# Patient Record
Sex: Male | Born: 1935 | Race: White | Hispanic: No | Marital: Married | Smoking: Never smoker
Health system: Southern US, Community
[De-identification: ages and names within clinical notes are randomized; demographics above are authoritative.]

## PROBLEM LIST (undated history)

## (undated) DIAGNOSIS — H3530 Unspecified macular degeneration: Secondary | ICD-10-CM

## (undated) DIAGNOSIS — I10 Essential (primary) hypertension: Secondary | ICD-10-CM

## (undated) HISTORY — DX: Unspecified macular degeneration: H35.30

## (undated) HISTORY — PX: EYE SURGERY: SHX253

## (undated) HISTORY — PX: CATARACT EXTRACTION: SUR2

## (undated) HISTORY — PX: ROTATOR CUFF REPAIR: SHX139

---

## 1998-09-07 ENCOUNTER — Ambulatory Visit (HOSPITAL_COMMUNITY): Admission: RE | Admit: 1998-09-07 | Discharge: 1998-09-07 | Payer: Self-pay | Admitting: Family Medicine

## 1998-09-07 ENCOUNTER — Encounter: Payer: Self-pay | Admitting: Family Medicine

## 1999-12-24 ENCOUNTER — Ambulatory Visit (HOSPITAL_COMMUNITY): Admission: RE | Admit: 1999-12-24 | Discharge: 1999-12-24 | Payer: Self-pay | Admitting: Gastroenterology

## 1999-12-24 ENCOUNTER — Encounter (INDEPENDENT_AMBULATORY_CARE_PROVIDER_SITE_OTHER): Payer: Self-pay | Admitting: *Deleted

## 2000-12-22 ENCOUNTER — Encounter (INDEPENDENT_AMBULATORY_CARE_PROVIDER_SITE_OTHER): Payer: Self-pay | Admitting: Specialist

## 2000-12-22 ENCOUNTER — Ambulatory Visit (HOSPITAL_COMMUNITY): Admission: RE | Admit: 2000-12-22 | Discharge: 2000-12-22 | Payer: Self-pay | Admitting: Gastroenterology

## 2002-10-09 ENCOUNTER — Ambulatory Visit (HOSPITAL_COMMUNITY): Admission: RE | Admit: 2002-10-09 | Discharge: 2002-10-09 | Payer: Self-pay | Admitting: Family Medicine

## 2002-10-09 ENCOUNTER — Encounter: Payer: Self-pay | Admitting: Family Medicine

## 2009-07-16 ENCOUNTER — Encounter: Admission: RE | Admit: 2009-07-16 | Discharge: 2009-07-16 | Payer: Self-pay

## 2010-01-02 ENCOUNTER — Observation Stay (HOSPITAL_COMMUNITY)
Admission: AD | Admit: 2010-01-02 | Discharge: 2010-01-03 | Payer: Self-pay | Source: Home / Self Care | Admitting: Orthopedic Surgery

## 2010-07-26 LAB — URINALYSIS, ROUTINE W REFLEX MICROSCOPIC
Bilirubin Urine: NEGATIVE
Glucose, UA: NEGATIVE mg/dL
Hgb urine dipstick: NEGATIVE
Ketones, ur: NEGATIVE mg/dL
Nitrite: NEGATIVE
Protein, ur: NEGATIVE mg/dL
Specific Gravity, Urine: 1.021 (ref 1.005–1.030)
Urobilinogen, UA: 0.2 mg/dL (ref 0.0–1.0)
pH: 6 (ref 5.0–8.0)

## 2010-07-26 LAB — URINE MICROSCOPIC-ADD ON

## 2010-07-26 LAB — SURGICAL PCR SCREEN
MRSA, PCR: NEGATIVE
Staphylococcus aureus: NEGATIVE

## 2010-07-26 LAB — COMPREHENSIVE METABOLIC PANEL
AST: 15 U/L (ref 0–37)
CO2: 29 mEq/L (ref 19–32)
Chloride: 104 mEq/L (ref 96–112)
Creatinine, Ser: 1.04 mg/dL (ref 0.4–1.5)
GFR calc Af Amer: >60 mL/min (ref >60)
GFR calc non Af Amer: >60 mL/min (ref >60)
Total Bilirubin: 0.7 mg/dL (ref 0.3–1.2)

## 2010-07-26 LAB — CBC
Hemoglobin: 13.2 g/dL (ref 13.0–17.0)
MCH: 31.4 pg (ref 26.0–34.0)
MCV: 91 fL (ref 78.0–100.0)
RBC: 4.21 MIL/uL — ABNORMAL LOW (ref 4.22–5.81)

## 2010-07-26 LAB — DIFFERENTIAL
Basophils Absolute: 0.1 10*3/uL (ref 0.0–0.1)
Basophils Relative: 1 % (ref 0–1)
Eosinophils Absolute: 0.1 10*3/uL (ref 0.0–0.7)
Eosinophils Relative: 1 % (ref 0–5)
Lymphocytes Relative: 15 % (ref 12–46)

## 2010-07-26 LAB — PROTIME-INR: Prothrombin Time: 13.9 seconds (ref 11.6–15.2)

## 2010-09-27 NOTE — Procedures (Signed)
[facility]  Patient:    [name], [name]                         MRN: [id number] Proc. Date: 12/24/99 Adm. Date:  [id number] Attending:  Nelda Marseille CC:         Petra Kuba, M.D.  [facility]   Procedure Report  PROCEDURE PERFORMED:  Colonoscopy with polypectomy.  ENDOSCOPIST:  Petra Kuba, M.D.  INDICATIONS FOR PROCEDURE:  Bright red blood per rectum in a patient due for colonic screening and guaiac positive.  Consent was signed after risks, benefits, methods, and options were thoroughly discussed in the office and with him and his wife prior to any premedicines given.  MEDICATIONS USED:  Demerol 50 mg, Versed 5 mg.  DESCRIPTION OF PROCEDURE:  Rectal inspection was pertinent for external hemorrhoids.  Digital exam was negative.  Video colonoscope was inserted and fairly easily advanced around the colon to the cecum.  This required rolling him on his back and some abdominal pressure.  The cecum was identified by the appendiceal orifice and the ileocecal valve.  In fact the scope was inserted a short ways in the terminal ileum which was normal.  Photodocumentation was obtained.  No obvious abnormality was seen on insertion.  The scope was slowly withdrawn.  On slow withdrawal the cecum and the ascending were normal. The prep was adequate.  There was some liquid stool that required washing and suctioning.  In the midtransverse and middescending, two questionable tiny to small polyps were seen and were each cold biopsied x 1 or 2 and put in the same container.  The scope was slowly withdrawn, no additional findings were seen as we withdrew back to the rectum and in the most distal rectum, a 1 to 1.5 sessile polyp was seen.  It was confirmed on retroflexion.  Also internal hemorrhoids were seen on retroflexion.  We went ahead and marked the snare in the customary fashion and went ahead and snared the polyp in the conventional fashion.   Electrocautery was applied.  The polyp was removed.  Suctioned onto the head of the scope and the polyps was recovered by removing the scope.  The scope was reinserted.  There seemed to be a small amount of residual polyp on the edge which was snared, electrocautery applied and this piece was suctioned through the scope and collected in the trap.  There still seemed to be possibly some residual polyp on the edge of the snare margin and multiple cold biopsies around the edge were obtained.  Just next to the polypectomy site, possibly some superficial spreading adenomatous tissue was seen and two hot biopsies were obtained of this area and put in a third container to keep separate.  The scope was retroflexed which again seemed like the entire polyp was removed with no obvious residual polyp.  There were no signs of bleeding. The scope was straightened.  Air was withdrawn and the scope removed.  The patient tolerated the procedure well.  There was no obvious immediate complication.  ENDOSCOPIC DIAGNOSIS: 1. Internal and external hemorrhoids. 2. 1 to 1.5 sessile distal rectal polyp status post snare x 2 with multiple    hot biopsies of the edge. 3. Hot biopsy of an area next to the polyp, questionably this was a residual    superficial spreading part of the polyp put in the third container. 4. Two tiny to small descending and  transverse questionable polyps, status    post cold biopsy, put in a first container. 5. Otherwise within normal limits to the terminal ileum.  PLAN:  Await pathology but probably will need at least a flexible sigmoidoscopy to document complete healing in probably probably six to 12 months.  Otherwise will see him back in six weeks to recheck symptoms, guaiacs and make sure no further work-up plans are needed.  Also put him on two-week customary postpolypectomy instructions.  Call me sooner p.r.n. DD:  12/24/99 TD:  12/24/99 Job: [id number]

## 2010-09-27 NOTE — Procedures (Signed)
[facility]  Patient:    [name], [name]                         MRN: [id number] Proc. Date: 12/22/00 Adm. Date:  [id number] Attending:  Nelda Marseille CC:         Doreatha Lew, M.D.   Procedure Report  PROCEDURE:  Colonoscopy with polypectomy.  INDICATIONS FOR PROCEDURE:  A patient with a history of colon polyps due for repeat screening.  Consent was signed after risks, benefits, methods, and options were thoroughly discussed multiple times in the past.  MEDICINES USED:  Demerol 60, Versed 6.  DESCRIPTION OF PROCEDURE:  Rectal inspection was pertinent for small external hemorrhoids. Digital exam was negative. The video pediatric colonoscope was inserted and fairly easily advanced around the colon to the cecum. This did require some abdominal pressure but no position changes. No obvious abnormality was seen on insertion. The cecum was identified by the appendiceal orifice and the ileocecal valve. The scope was then slowly withdrawn. The prep was adequate. There was some liquid stool that required washing and suctioning. In the proximal ascending colon, a 2-3 mm polyp along the fold just above the cecum was seen and hot biopsied x 1. The scope was slowly withdrawn. No other polypoid lesions, massed, diverticula or other abnormalities were seen as we slowly withdrew back to the rectum. The sigmoid was tortuous with some spasms but no obvious lesions were seen. A good look at the rectum did not reveal any residual polyp from his previous polypectomy. Once back in the rectum, the scope was retroflexed pertinent for some internal hemorrhoids. The scope was straightened and readvanced a short ways up the sigmoid, air was suctioned, scope removed. The patient tolerated the procedure well and there was no obvious or immediate complications.  ENDOSCOPIC DIAGNOSIS: 1. Internal/external hemorrhoids. 2. Tortuous sigmoid. 3. Small proximal ascending  polyp status post hot biopsy. 4. Otherwise within normal limits to the cecum.  PLAN:  Await pathology but probably repeat screening in five years. GI follow-up p.r.n. otherwise return care to Dr. Adonis Housekeeper for the customary health care maintenance to include yearly rectals and guaiacs and will hold aspirins and nonsteroidals for one week. DD:  12/22/00 TD:  12/22/00 Job: [id number]

## 2011-03-28 ENCOUNTER — Other Ambulatory Visit: Payer: Self-pay | Admitting: Gastroenterology

## 2012-03-23 ENCOUNTER — Other Ambulatory Visit: Payer: Self-pay | Admitting: Dermatology

## 2013-08-08 ENCOUNTER — Emergency Department (HOSPITAL_COMMUNITY)
Admission: EM | Admit: 2013-08-08 | Discharge: 2013-08-08 | Disposition: A | Discharge status: Home / Self Care | Payer: Medicare Other | Attending: Emergency Medicine | Admitting: Emergency Medicine

## 2013-08-08 ENCOUNTER — Emergency Department (HOSPITAL_COMMUNITY): Payer: Medicare Other

## 2013-08-08 ENCOUNTER — Encounter (HOSPITAL_COMMUNITY): Payer: Self-pay | Admitting: Emergency Medicine

## 2013-08-08 DIAGNOSIS — S129XXA Fracture of neck, unspecified, initial encounter: Secondary | ICD-10-CM | POA: Insufficient documentation

## 2013-08-08 DIAGNOSIS — Y9389 Activity, other specified: Secondary | ICD-10-CM | POA: Insufficient documentation

## 2013-08-08 DIAGNOSIS — Y929 Unspecified place or not applicable: Secondary | ICD-10-CM | POA: Insufficient documentation

## 2013-08-08 DIAGNOSIS — IMO0002 Reserved for concepts with insufficient information to code with codable children: Secondary | ICD-10-CM | POA: Insufficient documentation

## 2013-08-08 DIAGNOSIS — Z23 Encounter for immunization: Secondary | ICD-10-CM | POA: Insufficient documentation

## 2013-08-08 DIAGNOSIS — S0191XA Laceration without foreign body of unspecified part of head, initial encounter: Secondary | ICD-10-CM

## 2013-08-08 DIAGNOSIS — S0990XA Unspecified injury of head, initial encounter: Secondary | ICD-10-CM | POA: Insufficient documentation

## 2013-08-08 DIAGNOSIS — W208XXA Other cause of strike by thrown, projected or falling object, initial encounter: Secondary | ICD-10-CM | POA: Insufficient documentation

## 2013-08-08 DIAGNOSIS — I10 Essential (primary) hypertension: Secondary | ICD-10-CM | POA: Insufficient documentation

## 2013-08-08 DIAGNOSIS — S0100XA Unspecified open wound of scalp, initial encounter: Secondary | ICD-10-CM | POA: Insufficient documentation

## 2013-08-08 DIAGNOSIS — S12100A Unspecified displaced fracture of second cervical vertebra, initial encounter for closed fracture: Secondary | ICD-10-CM

## 2013-08-08 HISTORY — DX: Essential (primary) hypertension: I10

## 2013-08-08 MED ORDER — TETANUS-DIPHTH-ACELL PERTUSSIS 5-2.5-18.5 LF-MCG/0.5 IM SUSP
0.5000 mL | Freq: Once | INTRAMUSCULAR | Status: AC
  Administered 2013-08-08: 0.5 mL via INTRAMUSCULAR
  Filled 2013-08-08: qty 0.5

## 2013-08-08 MED ORDER — OXYCODONE HCL 5 MG PO TABS: 5.0000 mg | ORAL_TABLET | ORAL | Status: DC | PRN

## 2013-08-08 NOTE — ED Notes (Signed)
Ortho paged. 

## 2013-08-08 NOTE — ED Notes (Signed)
Discussed with patient that discharge papers will be produced soon so he can leave.

## 2013-08-08 NOTE — ED Notes (Signed)
Spoke with Misty StanleyLisa, PA-C, and no records of tetanus shot recently, so she would still like for this to be administered.

## 2013-08-08 NOTE — ED Notes (Signed)
Family reports that the patient has already received tetanus shot 2 years ago.

## 2013-08-08 NOTE — ED Notes (Signed)
Tree branch fell on top of patient. Denies LOC. Unsure of last tetanus shot.  Large abrasion to top of head, small area of active bleeding. PA at bedside evaluating patient. Pressure dressing applied. C collar in place. Pt states neck feels stiff.

## 2013-08-08 NOTE — ED Notes (Signed)
Patient transported to X-ray 

## 2013-08-08 NOTE — ED Notes (Signed)
Patient back from CT and X-ray   

## 2013-08-08 NOTE — ED Provider Notes (Signed)
CSN: [id number]     Arrival date & time 08/08/13  1839 History   First MD Initiated Contact with Patient 08/08/13 1851     Chief Complaint  Patient presents with  . Head Laceration     (Consider location/radiation/quality/duration/timing/severity/associated sxs/prior Treatment) The history is provided by the patient, a relative and medical records.   This is a 80 year old male with past medical history significant for hypertension, presenting to the ED for head injury.  Patient was cutting tree limbs with a chainsaw when a medium-sized limb fell on top of his head. Patient denies loss of consciousness.  Pt did sustain a large laceration to the top of his head, continues to bleed on arrival, and is also complaining of neck pain.  Pt states he has a headache localized and area of impact.  Denies dizziness, confusion, visual disturbance, or tinnitus.  Pt not currently on any anti-coagulants.  Date of last tetanus unknown, thinks was 1-2 years ago but not sure.  VS stable on arrival.  Past Medical History  Diagnosis Date  . Hypertension    History reviewed. No pertinent past surgical history. History reviewed. No pertinent family history. History  Substance Use Topics  . Smoking status: Never Smoker   . Smokeless tobacco: Not on file  . Alcohol Use: No    Review of Systems  Musculoskeletal: Positive for neck pain.  Skin: Positive for wound.  All other systems reviewed and are negative.   Allergies  Review of patient's allergies indicates no known allergies.  Home Medications  No current outpatient prescriptions on file. BP 159/87  Pulse 91  Temp(Src) 98.3 F (36.8 C) (Oral)  Resp 18  Ht 5\' 3"  (1.6 m)  Wt 129 lb (58.514 kg)  BMI 22.86 kg/m2  SpO2 100%  Physical Exam  Nursing note and vitals reviewed. Constitutional: He is oriented to person, place, and time. He appears well-developed and well-nourished. No distress.  HENT:  Head: Normocephalic. Head is with laceration.      Mouth/Throat: Oropharynx is clear and moist.  approx 8cm laceration to top of scalp, mild bleeding noted to proximal aspect; no FB noted  Eyes: Conjunctivae and EOM are normal. Pupils are equal, round, and reactive to light.  Neck:  Immobilized in c-collar  Cardiovascular: Normal rate, regular rhythm and normal heart sounds.   Pulmonary/Chest: Effort normal and breath sounds normal. No respiratory distress. He has no wheezes.  Musculoskeletal:       Hands: Small abrasion to left proximal thumb without bleeding; no deformity or bony tenderness; full ROM maintained; strong radial pulse and cap refill; sensation intact  Neurological: He is alert and oriented to person, place, and time.  AAOx3, answering questions appropriately; equal strength UE and LE bilaterally; CN grossly intact; moves all extremities appropriately without ataxia; no focal neuro deficits or facial asymmetry appreciated  Skin: Skin is warm and dry. He is not diaphoretic.  Psychiatric: He has a normal mood and affect.    ED Course  Procedures (including critical care time)  LACERATION REPAIR Performed by: Garlon Hatchet Authorized by: Garlon Hatchet Consent: Verbal consent obtained. Risks and benefits: risks, benefits and alternatives were discussed Consent given by: patient Patient identity confirmed: provided demographic data Prepped and Draped in normal sterile fashion Wound explored  Laceration Location: scalp  Laceration Length: 8 cm  No Foreign Bodies seen or palpated  Anesthesia: local infiltration  Local anesthetic: lidocaine 1% with epinephrine  Anesthetic total: 6 ml  Irrigation method: syringe Amount  of cleaning: standard  Skin closure: staples  Number of staples:  7  Technique: n/a  Patient tolerance: Patient tolerated the procedure well with no immediate complications.  Labs Review Labs Reviewed - No data to display Imaging Review Ct Head Wo Contrast  08/08/2013   CLINICAL  DATA:  Fall with trauma to the head and neck.  EXAM: CT HEAD WITHOUT CONTRAST  CT CERVICAL SPINE WITHOUT CONTRAST  TECHNIQUE: Multidetector CT imaging of the head and cervical spine was performed following the standard protocol without intravenous contrast. Multiplanar CT image reconstructions of the cervical spine were also generated.  COMPARISON:  None.  FINDINGS: CT HEAD FINDINGS  The brain shows mild generalized atrophy. No sign of acute infarction, mass lesion, hemorrhage, hydrocephalus or extra-axial collection. There is a a right frontal scalp injury. No underlying skull fracture. No fluid in the sinuses, middle ears or mastoids.  CT CERVICAL SPINE FINDINGS  There is acute fracture through the base of the dens. There were some pre-existing cysts. No displacement. No compromise of the spinal canal. No other cervical spine region fracture. There is degenerative retrolisthesis at C3-4 of 2 mm. There is ordinary spondylosis and facet arthropathy. There is mild loss of height anteriorly at T2 which is age indeterminate but presumed chronic.  IMPRESSION: Head CT:  No acute intracranial finding.  Age related atrophy.  Cervical spine CT: Acute fracture of the dens, nondisplaced. No neural encroachment. No other acute cervical injury.  Mild loss of height anteriorly at T2, age indeterminate but presumed chronic.  These results were called by telephone at the time of interpretation on 08/08/2013 at 8:13 PM to [facility] , who verbally acknowledged these results.   Electronically Signed   By: Paulina Fusi M.D.   On: 08/08/2013 20:13   Ct Cervical Spine Wo Contrast  08/08/2013   CLINICAL DATA:  Fall with trauma to the head and neck.  EXAM: CT HEAD WITHOUT CONTRAST  CT CERVICAL SPINE WITHOUT CONTRAST  TECHNIQUE: Multidetector CT imaging of the head and cervical spine was performed following the standard protocol without intravenous contrast. Multiplanar CT image reconstructions of the cervical spine were also  generated.  COMPARISON:  None.  FINDINGS: CT HEAD FINDINGS  The brain shows mild generalized atrophy. No sign of acute infarction, mass lesion, hemorrhage, hydrocephalus or extra-axial collection. There is a a right frontal scalp injury. No underlying skull fracture. No fluid in the sinuses, middle ears or mastoids.  CT CERVICAL SPINE FINDINGS  There is acute fracture through the base of the dens. There were some pre-existing cysts. No displacement. No compromise of the spinal canal. No other cervical spine region fracture. There is degenerative retrolisthesis at C3-4 of 2 mm. There is ordinary spondylosis and facet arthropathy. There is mild loss of height anteriorly at T2 which is age indeterminate but presumed chronic.  IMPRESSION: Head CT:  No acute intracranial finding.  Age related atrophy.  Cervical spine CT: Acute fracture of the dens, nondisplaced. No neural encroachment. No other acute cervical injury.  Mild loss of height anteriorly at T2, age indeterminate but presumed chronic.  These results were called by telephone at the time of interpretation on 08/08/2013 at 8:13 PM to [facility] , who verbally acknowledged these results.   Electronically Signed   By: Paulina Fusi M.D.   On: 08/08/2013 20:13   Dg Finger Thumb Left  08/08/2013   CLINICAL DATA:  Laceration at the MCP joint.  EXAM: LEFT THUMB 2+V  COMPARISON:  None.  FINDINGS: No evidence of fracture or dislocation. There are mild degenerative changes at the MCP joint. Small periarticular calcification dorsal to the interphalangeal joint not significant. No radiopaque foreign object.  IMPRESSION: No fracture or foreign object.   Electronically Signed   By: Paulina FusiMark  Shogry M.D.   On: 08/08/2013 20:03     EKG Interpretation None      MDM   Final diagnoses:  Closed C2 fracture  Laceration of head   80 year old male s/p head injury after tree limb fell on top of head.  No LOC.  Not currently on anti-coagulants.  Neurovascularly intact.  Will  obtain CT head, CS, and left thumb film.  Tetanus updated.  CT revealing C2 fx at base of dens.  No ICH identified.  Thumb film negative.  Discussed with neurosurgery, Dr. Wynetta Emeryram who has reviewed films-- recommend aspen cervical collar, FU in office tomorrow for re-check.  Laceration repaired with staples as above, pt tolerated well.  Pt remains neurovascularly intact.  Discussed plan with pt, they agreed.  Return precautions advised.  Discussed with Dr. Gwendolyn GrantWalden who personally evaluated pt and agrees with plan of care.  Garlon HatchetLisa M Dontay Harm, PA-C 08/08/13 2339

## 2013-08-08 NOTE — ED Notes (Addendum)
Pt states that a tree fell on his head approx. ago. Pt has a laceration to the top of head. Pt denies blood thinners. Pt denies LOC. Pt complaining of neck pain (c-collar applied in triage) pt alert and oriented (hard of hearing) pt able to follow commands. Pt has bandage applied to top of head.

## 2013-08-08 NOTE — Discharge Instructions (Signed)
Take the prescribed medication as directed for pain.  This medicine can cause constipation so may wish to add stool softener to daily regimen to prevent this.  Colace, dulcolax, miralax, etc. Are all available over the counter and work well. Leave collar in place at all times until instructed otherwise. Follow-up with Dr. Wynetta Emeryram tomorrow for re-check.  Call his office in the morning for appt time. Return to the ED for new or worsening symptoms.

## 2013-08-08 NOTE — ED Notes (Signed)
Reported blood pressure of 174/112 to Ski [place]isa, [place].  She will visit patient and update on current plan.

## 2013-08-08 NOTE — ED Notes (Signed)
Lisa placed staples, and nursing applied aspen cervicle collar and dressing to head.  Preparing for discharge.

## 2013-08-09 DIAGNOSIS — S12100A Unspecified displaced fracture of second cervical vertebra, initial encounter for closed fracture: Secondary | ICD-10-CM | POA: Insufficient documentation

## 2013-08-09 NOTE — ED Provider Notes (Signed)
Medical screening examination/treatment/procedure(s) were conducted as a shared visit with non-physician practitioner(s) and myself.  I personally evaluated the patient during the encounter.   EKG Interpretation None       Patient here s/p injury while cutting down tree branch. Hit him on the top of the head, no LOC. Walked 0.25 miles after injury [place]. Patient here with extremities intact, no numbness, no weakness. Large head lac. CT shows C2 dens fracture. Neurosurgery reviewed films - placed in [place] collar, will f/u with Dr. Wynetta Emeryram tomorrow. Patient ambulatory in the ED. Lac repaired.  Dagmar HaitWilliam Dajha Urquilla, MD 08/09/13 0030

## 2013-08-15 ENCOUNTER — Emergency Department (HOSPITAL_COMMUNITY)
Admission: EM | Admit: 2013-08-15 | Discharge: 2013-08-15 | Disposition: A | Discharge status: Home / Self Care | Payer: Medicare Other | Attending: Emergency Medicine | Admitting: Emergency Medicine

## 2013-08-15 ENCOUNTER — Emergency Department (HOSPITAL_COMMUNITY): Payer: Medicare Other

## 2013-08-15 ENCOUNTER — Encounter (HOSPITAL_COMMUNITY): Payer: Self-pay | Admitting: Emergency Medicine

## 2013-08-15 DIAGNOSIS — S12110A Anterior displaced Type II dens fracture, initial encounter for closed fracture: Secondary | ICD-10-CM

## 2013-08-15 DIAGNOSIS — G8911 Acute pain due to trauma: Secondary | ICD-10-CM | POA: Insufficient documentation

## 2013-08-15 DIAGNOSIS — S0990XA Unspecified injury of head, initial encounter: Secondary | ICD-10-CM

## 2013-08-15 DIAGNOSIS — I10 Essential (primary) hypertension: Secondary | ICD-10-CM | POA: Insufficient documentation

## 2013-08-15 DIAGNOSIS — Z8781 Personal history of (healed) traumatic fracture: Secondary | ICD-10-CM | POA: Insufficient documentation

## 2013-08-15 DIAGNOSIS — S22029A Unspecified fracture of second thoracic vertebra, initial encounter for closed fracture: Secondary | ICD-10-CM

## 2013-08-15 DIAGNOSIS — Z79899 Other long term (current) drug therapy: Secondary | ICD-10-CM | POA: Insufficient documentation

## 2013-08-15 DIAGNOSIS — R51 Headache: Secondary | ICD-10-CM | POA: Insufficient documentation

## 2013-08-15 DIAGNOSIS — M542 Cervicalgia: Secondary | ICD-10-CM | POA: Insufficient documentation

## 2013-08-15 DIAGNOSIS — Z7982 Long term (current) use of aspirin: Secondary | ICD-10-CM | POA: Insufficient documentation

## 2013-08-15 LAB — BASIC METABOLIC PANEL
BUN: 20 mg/dL (ref 6–23)
CHLORIDE: 101 meq/L (ref 96–112)
CO2: 27 meq/L (ref 19–32)
Calcium: 9.1 mg/dL (ref 8.4–10.5)
Creatinine, Ser: 0.84 mg/dL (ref 0.50–1.35)
GFR calc non Af Amer: 82 mL/min — ABNORMAL LOW (ref >90)
Glucose, Bld: 105 mg/dL — ABNORMAL HIGH (ref 70–99)
POTASSIUM: 4.1 meq/L (ref 3.7–5.3)
Sodium: 143 mEq/L (ref 137–147)

## 2013-08-15 LAB — CBC
HEMATOCRIT: 38.2 % — AB (ref 39.0–52.0)
HEMOGLOBIN: 13.1 g/dL (ref 13.0–17.0)
MCH: 30.4 pg (ref 26.0–34.0)
MCHC: 34.3 g/dL (ref 30.0–36.0)
MCV: 88.6 fL (ref 78.0–100.0)
Platelets: 348 10*3/uL (ref 150–400)
RBC: 4.31 MIL/uL (ref 4.22–5.81)
RDW: 13.6 % (ref 11.5–15.5)
WBC: 11.7 10*3/uL — AB (ref 4.0–10.5)

## 2013-08-15 MED ORDER — NAPROXEN SODIUM 220 MG PO TABS: 220.0000 mg | ORAL_TABLET | Freq: Two times a day (BID) | ORAL | Status: DC

## 2013-08-15 NOTE — ED Notes (Signed)
Pt was seen here a week ago today for a fall and had staples put in. Has followed up with Dr. Glee ArvinKram but he is out of the office until next Tuesday and was sent over here to be seen for increase in head pain. States when he lies down has more pain in his head, has to hold his head really carefully to lie down. Has been wearing his aspen collar as directed. Thinks he has a fx vertebrae but wasn't sure if the MD wanted to do more scans or not. States he is just hurting.

## 2013-08-15 NOTE — ED Notes (Signed)
Discharge instructions reviewed with pt and pt's wife.

## 2013-08-15 NOTE — ED Notes (Signed)
PA at bedside.

## 2013-08-15 NOTE — ED Notes (Signed)
Pt was seen last week for an injury to the head caused by a falling tree limb. Staples were placed. Pt reports 6/10 head pain in the area of the staples, with movement. Pt denies N/V or dizziness. Patient A&O and in NAD.

## 2013-08-15 NOTE — ED Provider Notes (Signed)
CSN: [id number]     Arrival date & time 08/15/13  1432 History   First MD Initiated Contact with Patient 08/15/13 1931     Chief Complaint  Patient presents with  . Headache     (Consider location/radiation/quality/duration/timing/severity/associated sxs/prior Treatment) HPI [name] is a 80 y.o. male presents to emergency department complaining of head and neck pain. Patient f had a large tree branch fall on his head a week ago. Patient was seen here and had a CT scan of his head and cervical spine done which showed C2 fracture. Patient also had a large laceration to the top of the head which was repaired with staples. He was seen by Dr. Wynetta Emery several days ago, and was supposed to go back today, but he was unable to be seen. Patient states that he is having increased headache. He states pain isn't in his head and radiates into the neck. States worsened with movement of the head. He denies any pain if he stays still. States only painful when he lays down, but resolves when he sits up. He denies taking any medications. States he took Aleve one time, but does not want to take anything for pain. Patient states that Dr. Wynetta Emery is unable to see him until 8 days from today. She was told to come to emergency department for evaluation to make sure this is not anything emergent. He denies any blurred vision, no dizziness, no numbness or weakness to extremities. No confusion or memory loss. He takes aspirin. No other blood thinners.   Past Medical History  Diagnosis Date  . Hypertension    Past Surgical History  Procedure Laterality Date  . Rotator cuff repair Bilateral    No family history on file. History  Substance Use Topics  . Smoking status: Never Smoker   . Smokeless tobacco: Not on file  . Alcohol Use: No    Review of Systems  Constitutional: Negative for fever and chills.  Eyes: Negative for visual disturbance.  Respiratory: Negative for cough, chest tightness and shortness of breath.    Cardiovascular: Negative for chest pain, palpitations and leg swelling.  Gastrointestinal: Negative for nausea, vomiting, abdominal pain, diarrhea and abdominal distention.  Genitourinary: Negative for dysuria, urgency, frequency and hematuria.  Musculoskeletal: Positive for neck pain. Negative for arthralgias, myalgias and neck stiffness.  Skin: Negative for rash.  Allergic/Immunologic: Negative for immunocompromised state.  Neurological: Positive for headaches. Negative for dizziness, weakness, light-headedness and numbness.  All other systems reviewed and are negative.      Allergies  Review of patient's allergies indicates no known allergies.  Home Medications   Current Outpatient Rx  Name  Route  Sig  Dispense  Refill  . Ascorbic Acid (VITAMIN C PO)   Oral   Take 1 tablet by mouth every Monday, Wednesday, and Friday. Takes along with vit e         . aspirin 81 MG tablet   Oral   Take 81 mg by mouth daily.         . Calcium Carb-Cholecalciferol (CALCIUM 600 + D PO)   Oral   Take 1 tablet by mouth 2 (two) times daily. Takes at lunch 1200         . lisinopril (PRINIVIL,ZESTRIL) 20 MG tablet   Oral   Take 20 mg by mouth daily.         Marland Kitchen lisinopril-hydrochlorothiazide (PRINZIDE,ZESTORETIC) 20-12.5 MG per tablet   Oral   Take 1 tablet by mouth daily.         Marland Kitchen  Multiple Vitamin (MULTIVITAMIN WITH MINERALS) TABS tablet   Oral   Take 0.5 tablets by mouth 2 (two) times daily.         . Multiple Vitamins-Minerals (EYE-VITE PLUS LUTEIN) CAPS   Oral   Take 1 capsule by mouth daily.         . Omega-3 Fatty Acids (FISH OIL) 1000 MG CAPS   Oral   Take 1 capsule by mouth 2 (two) times daily.         Adele Schilder Yeast Rice 600 MG TABS   Oral   Take 600 mg by mouth every Tuesday, Thursday, and Saturday at 6 PM. Takes one time at lunch         . vitamin E (VITAMIN E) 400 UNIT capsule   Oral   Take 400 Units by mouth every Monday, Wednesday, and Friday.          Marland Kitchen oxyCODONE (OXY IR/ROXICODONE) 5 MG immediate release tablet   Oral   Take 1 tablet (5 mg total) by mouth every 4 (four) hours as needed for severe pain.   20 tablet   0    BP 162/92  Pulse 98  Temp(Src) 98.2 F (36.8 C) (Oral)  Resp 20  Ht 5\' 3"  (1.6 m)  Wt 129 lb (58.514 kg)  BMI 22.86 kg/m2  SpO2 97% Physical Exam  Nursing note and vitals reviewed. Constitutional: He appears well-developed and well-nourished. No distress.  HENT:  Head: Normocephalic.  Large healing laceration to the top of the head, staples intact, dried up blood noted around the laceration.   Eyes: Conjunctivae and EOM are normal. Pupils are equal, round, and reactive to light.  Neck: Neck supple.  c collar in place.   Cardiovascular: Normal rate, regular rhythm and normal heart sounds.   Pulmonary/Chest: Effort normal. No respiratory distress. He has no wheezes. He has no rales.  Abdominal: Soft. Bowel sounds are normal. He exhibits no distension. There is no tenderness. There is no rebound.  Musculoskeletal: He exhibits no edema.  Neurological: He is alert.  5/5 and equal upper and lower extremity strength bilaterally. Equal grip strength bilaterally. Normal finger to nose and heel to shin. No pronator drift. Patellar reflexes 2+. Gait is normal.    Skin: Skin is warm and dry.    ED Course  Procedures (including critical care time) Labs Review Labs Reviewed  BASIC METABOLIC PANEL - Abnormal; Notable for the following:    Glucose, Bld 105 (*)    GFR calc non Af Amer 82 (*)    All other components within normal limits  CBC - Abnormal; Notable for the following:    WBC 11.7 (*)    HCT 38.2 (*)    All other components within normal limits   Imaging Review Ct Head Wo Contrast  08/15/2013   CLINICAL DATA:  80 year old male status post fall a week ago resulting in odontoid fracture. increased pain. Initial encounter.  EXAM: CT HEAD WITHOUT CONTRAST  CT CERVICAL SPINE WITHOUT CONTRAST  TECHNIQUE:  Multidetector CT imaging of the head and cervical spine was performed following the standard protocol without intravenous contrast. Multiplanar CT image reconstructions of the cervical spine were also generated.  COMPARISON:  08/08/2013.  FINDINGS: CT HEAD FINDINGS  Stable, negative visible paranasal sinuses and mastoids. Calvarium intact. Interval repair of the vertex scalp soft tissue injury. Skin staples in place. Stable, negative orbit soft tissues.  Stable and normal for age cerebral volume. No midline shift, mass effect, or evidence  of intracranial mass lesion. No acute intracranial hemorrhage identified. No ventriculomegaly. Stable gray-white matter differentiation throughout the brain. No evidence of cortically based acute infarction identified.  CT CERVICAL SPINE FINDINGS  Interval progressed but overall mild loss of T2 vertebral body height with anterior wedging (sagittal image 27). More apparent today is associated L2 posterior element fractures, comminuted on the right and nondisplaced involving the lamina on the left. Associated comminuted nondisplaced fracture of the right head of the second rib.  No pneumothorax or pleural effusion identified in the visible upper chest.  Stable cervicothoracic junction alignment. Cervical spine posterior element alignment stable and within normal limits.  Type 2 odontoid fracture re- identified with mild interval increased resorption along the fracture plane. Subtle increased angulation of the odontoid fragment. C1-C2 alignment is stable. Visualized skull base is intact. No atlanto-occipital dissociation.  No new cervical spine fracture identified. Grossly negative paraspinal soft tissues.  IMPRESSION: 1. T2 vertebral fracture described previously now is confirmed to be acute/subacute - with mild interval loss of T2 vertebral body height and increased conspicuity of bilateral posterior element fractures. Subsequently, this injury may be and stable. Recommend spine  surgery consult regarding this level. This finding discussed with Provider Jaynie CrumbleATYANA Sheria Rosello in the ED at 8:56 PM on 08/15/2013. 2. Type 2 odontoid fracture shows expected interval evolution, with some bone resorption along the fracture plane, and stable to slightly increased posterior angulation. 3. Otherwise stable cervical spine. Nondisplaced right second rib costovertebral junction fracture incidentally noted and associated with #1. 4.  No acute intracranial abnormality.   Electronically Signed   By: Augusto GambleLee  Hall M.D.   On: 08/15/2013 20:57   Ct Cervical Spine Wo Contrast  08/15/2013   CLINICAL DATA:  [age] year old male status post fall a week ago resulting in odontoid fracture. increased pain. Initial encounter.  EXAM: CT HEAD WITHOUT CONTRAST  CT CERVICAL SPINE WITHOUT CONTRAST  TECHNIQUE: Multidetector CT imaging of the head and cervical spine was performed following the standard protocol without intravenous contrast. Multiplanar CT image reconstructions of the cervical spine were also generated.  COMPARISON:  08/08/2013.  FINDINGS: CT HEAD FINDINGS  Stable, negative visible paranasal sinuses and mastoids. Calvarium intact. Interval repair of the vertex scalp soft tissue injury. Skin staples in place. Stable, negative orbit soft tissues.  Stable and normal for age cerebral volume. No midline shift, mass effect, or evidence of intracranial mass lesion. No acute intracranial hemorrhage identified. No ventriculomegaly. Stable gray-white matter differentiation throughout the brain. No evidence of cortically based acute infarction identified.  CT CERVICAL SPINE FINDINGS  Interval progressed but overall mild loss of T2 vertebral body height with anterior wedging (sagittal image 27). More apparent today is associated L2 posterior element fractures, comminuted on the right and nondisplaced involving the lamina on the left. Associated comminuted nondisplaced fracture of the right head of the second rib.  No pneumothorax  or pleural effusion identified in the visible upper chest.  Stable cervicothoracic junction alignment. Cervical spine posterior element alignment stable and within normal limits.  Type 2 odontoid fracture re- identified with mild interval increased resorption along the fracture plane. Subtle increased angulation of the odontoid fragment. C1-C2 alignment is stable. Visualized skull base is intact. No atlanto-occipital dissociation.  No new cervical spine fracture identified. Grossly negative paraspinal soft tissues.  IMPRESSION: 1. T2 vertebral fracture described previously now is confirmed to be acute/subacute - with mild interval loss of T2 vertebral body height and increased conspicuity of bilateral posterior element fractures. Subsequently, this injury may  be and stable. Recommend spine surgery consult regarding this level. This finding discussed with Provider Jaynie Crumble in the ED at 8:56 PM on 08/15/2013. 2. Type 2 odontoid fracture shows expected interval evolution, with some bone resorption along the fracture plane, and stable to slightly increased posterior angulation. 3. Otherwise stable cervical spine. Nondisplaced right second rib costovertebral junction fracture incidentally noted and associated with #1. 4.  No acute intracranial abnormality.   Electronically Signed   By: Augusto Gamble M.D.   On: 08/15/2013 20:57     EKG Interpretation None      MDM   Final diagnoses:  T2 vertebral fracture  Odontoid fracture  Head injury    Patient is here in emergency department complaining of headache and neck pain. He was seen a week ago for the same after a large branch fell onto his head. Patient received staples to the laceration over his scalp and was discharged with Aspen collar for his C2 fracture. Patient denies any new injuries. He states his pain is only worsened with laying down. Imaging repeated. CT of the cervical spine showed possibly unstable T2 fracture. I discussed this with the  radiologist and spoke with Dr. Jule Ser who is on call for Dr. Wynetta Emery. According to Dr. Penelope Galas, patient is okay to be discharged home. Continue on Aleve. Close followup with Dr. Wynetta Emery next Tuesday as scheduled. Patient is agreeable to the plan, and will be discharged home. At time of discharge patient is neurovascularly intact. He was given careful precautions to return.  Filed Vitals:   08/15/13 2055 08/15/13 2100 08/15/13 2115 08/15/13 2130  BP: 150/89 161/99  160/94  Pulse: 95 98 101 101  Temp:      TempSrc:      Resp:      Height:      Weight:      SpO2: 96% 96% 98% 98%       Lottie Mussel, PA-C 08/16/13 0039

## 2013-08-15 NOTE — Discharge Instructions (Signed)
Aleve for pain as prescribed. Limit physical acitivity. Follow up with Dr. Wynetta Emeryram as soon as able. Return if increased pain, numbness or weakness in extremities.   Vertebral Fracture You have a fracture of one or more vertebra. These are the bony parts that form the spine. Minor vertebral fractures happen when people fall. Osteoporosis is associated with many of these fractures. Hospital care may not be necessary for minor compression fractures that are stable. However, multiple fractures of the spine or unstable injuries can cause severe pain and even damage the spinal cord. A spinal cord injury may cause paralysis, numbness, or loss of normal bowel and bladder control.  Normally there is pain and stiffness in the back for 3 to 6 weeks after a vertebral fracture. Bed rest for several days, pain medicine, and a slow return to activity is often the only treatment that is needed depending on the location of the fracture. Neck and back braces may be helpful in reducing pain and increasing mobility. When your pain allows, you should begin walking or swimming to help maintain your endurance. Exercises to improve motion and to strengthen the back may also be useful after the initial pain improves. Treatment for osteoporosis may be essential for full recovery. This will help reduce your risk of vertebral fractures with a future fall. During the first few days after a spine fracture you may feel nauseated or vomit. If this is severe, hospital care with IV fluids will be needed.  Arrange for follow-up care as recommended to assure proper long-term care and prevention of further spine injury.  SEEK IMMEDIATE MEDICAL CARE IF:  You have increasing pain, vomiting, or are unable to move around at all.  You develop numbness, tingling, weakness, or paralysis of any part of your body.  You develop a loss of normal bowel or bladder control.  You have difficulty breathing, cough, fever, chest or abdominal pain. MAKE SURE  YOU:   Understand these instructions.  Will watch your condition.  Will get help right away if you are not doing well or get worse. Document Released: 06/05/2004 Document Revised: 07/21/2011 Document Reviewed: 12/19/2008 [facility] LLCExit[facility] Patient Information 2014 [place], [place].

## 2013-08-16 NOTE — ED Provider Notes (Signed)
Medical screening examination/treatment/procedure(s) were conducted as a shared visit with non-physician practitioner(s) and myself.  I personally evaluated the patient during the encounter.   EKG Interpretation None      Patient presenting with persistent head and neck pain following a fall week ago. Patient had CT scan at that time showed a C2 fracture patient is in a collar. Patient was seen by Dr. Glee ArvinKram several days ago was supposed to go back today but was unable to. Repeat scans here raise concerns for an unstable T2 fracture. Discussed with neurosurgery. They do not feel that that that was unstable. Patient can keep his collar on. Patient has no significant neuro deficit. Can followup of with neurosurgery.    Shelda JakesScott W. Laroy Mustard, MD 08/16/13 [phone]

## 2013-10-05 ENCOUNTER — Other Ambulatory Visit: Payer: Self-pay | Admitting: Neurosurgery

## 2013-10-05 DIAGNOSIS — S12100A Unspecified displaced fracture of second cervical vertebra, initial encounter for closed fracture: Secondary | ICD-10-CM

## 2013-10-27 ENCOUNTER — Ambulatory Visit
Admission: RE | Admit: 2013-10-27 | Discharge: 2013-10-27 | Disposition: A | Discharge status: Home / Self Care | Payer: Medicare Other | Source: Ambulatory Visit | Attending: Neurosurgery | Admitting: Neurosurgery

## 2013-10-27 DIAGNOSIS — S12100A Unspecified displaced fracture of second cervical vertebra, initial encounter for closed fracture: Secondary | ICD-10-CM

## 2014-01-11 ENCOUNTER — Ambulatory Visit
Admission: RE | Admit: 2014-01-11 | Discharge: 2014-01-11 | Disposition: A | Discharge status: Home / Self Care | Payer: Medicare Other | Source: Ambulatory Visit | Attending: Family Medicine | Admitting: Family Medicine

## 2014-01-11 ENCOUNTER — Other Ambulatory Visit: Payer: Self-pay | Admitting: Family Medicine

## 2014-01-11 DIAGNOSIS — R1310 Dysphagia, unspecified: Secondary | ICD-10-CM

## 2014-01-12 ENCOUNTER — Other Ambulatory Visit: Payer: Self-pay | Admitting: Gastroenterology

## 2014-01-12 DIAGNOSIS — R1310 Dysphagia, unspecified: Secondary | ICD-10-CM

## 2014-01-13 ENCOUNTER — Other Ambulatory Visit: Payer: Self-pay | Admitting: Family Medicine

## 2014-01-13 DIAGNOSIS — E041 Nontoxic single thyroid nodule: Secondary | ICD-10-CM

## 2014-01-18 ENCOUNTER — Ambulatory Visit
Admission: RE | Admit: 2014-01-18 | Discharge: 2014-01-18 | Disposition: A | Discharge status: Home / Self Care | Payer: Medicare Other | Source: Ambulatory Visit | Attending: Gastroenterology | Admitting: Gastroenterology

## 2014-01-18 DIAGNOSIS — R1310 Dysphagia, unspecified: Secondary | ICD-10-CM

## 2014-01-26 ENCOUNTER — Other Ambulatory Visit (HOSPITAL_COMMUNITY)
Admission: RE | Admit: 2014-01-26 | Discharge: 2014-01-26 | Disposition: A | Discharge status: Home / Self Care | Payer: Medicare Other | Source: Ambulatory Visit | Attending: Interventional Radiology | Admitting: Interventional Radiology

## 2014-01-26 ENCOUNTER — Ambulatory Visit
Admission: RE | Admit: 2014-01-26 | Discharge: 2014-01-26 | Disposition: A | Discharge status: Home / Self Care | Payer: Medicare Other | Source: Ambulatory Visit | Attending: Family Medicine | Admitting: Family Medicine

## 2014-01-26 DIAGNOSIS — E041 Nontoxic single thyroid nodule: Secondary | ICD-10-CM | POA: Diagnosis present

## 2014-02-07 ENCOUNTER — Other Ambulatory Visit: Payer: Medicare Other

## 2014-05-29 ENCOUNTER — Other Ambulatory Visit (INDEPENDENT_AMBULATORY_CARE_PROVIDER_SITE_OTHER): Payer: Self-pay | Admitting: Surgery

## 2014-05-29 DIAGNOSIS — E041 Nontoxic single thyroid nodule: Secondary | ICD-10-CM

## 2014-05-29 NOTE — Addendum Note (Signed)
Addended by: Asser Lucena M on: 05/29/2014 10:58 AM   Modules accepted: Orders  

## 2014-09-14 ENCOUNTER — Other Ambulatory Visit: Payer: Self-pay | Admitting: Surgery

## 2014-09-14 DIAGNOSIS — E041 Nontoxic single thyroid nodule: Secondary | ICD-10-CM

## 2014-09-18 ENCOUNTER — Other Ambulatory Visit: Payer: Self-pay | Admitting: Surgery

## 2014-09-19 ENCOUNTER — Other Ambulatory Visit: Payer: Self-pay | Admitting: Surgery

## 2014-09-19 DIAGNOSIS — E041 Nontoxic single thyroid nodule: Secondary | ICD-10-CM

## 2014-11-14 ENCOUNTER — Other Ambulatory Visit: Payer: Self-pay

## 2014-12-07 ENCOUNTER — Ambulatory Visit
Admission: RE | Admit: 2014-12-07 | Discharge: 2014-12-07 | Disposition: A | Discharge status: Home / Self Care | Payer: Medicare Other | Source: Ambulatory Visit | Attending: Surgery | Admitting: Surgery

## 2015-10-23 DIAGNOSIS — C44519 Basal cell carcinoma of skin of other part of trunk: Secondary | ICD-10-CM | POA: Diagnosis not present

## 2015-10-23 DIAGNOSIS — L570 Actinic keratosis: Secondary | ICD-10-CM | POA: Diagnosis not present

## 2015-10-23 DIAGNOSIS — L820 Inflamed seborrheic keratosis: Secondary | ICD-10-CM | POA: Diagnosis not present

## 2016-01-03 DIAGNOSIS — H353110 Nonexudative age-related macular degeneration, right eye, stage unspecified: Secondary | ICD-10-CM | POA: Diagnosis not present

## 2016-01-03 DIAGNOSIS — Z961 Presence of intraocular lens: Secondary | ICD-10-CM | POA: Diagnosis not present

## 2016-01-03 DIAGNOSIS — H35033 Hypertensive retinopathy, bilateral: Secondary | ICD-10-CM | POA: Diagnosis not present

## 2016-01-03 DIAGNOSIS — H353120 Nonexudative age-related macular degeneration, left eye, stage unspecified: Secondary | ICD-10-CM | POA: Diagnosis not present

## 2016-03-14 DIAGNOSIS — D729 Disorder of white blood cells, unspecified: Secondary | ICD-10-CM | POA: Diagnosis not present

## 2016-03-14 DIAGNOSIS — E782 Mixed hyperlipidemia: Secondary | ICD-10-CM | POA: Diagnosis not present

## 2016-03-14 DIAGNOSIS — H3530 Unspecified macular degeneration: Secondary | ICD-10-CM | POA: Diagnosis not present

## 2016-03-14 DIAGNOSIS — I10 Essential (primary) hypertension: Secondary | ICD-10-CM | POA: Diagnosis not present

## 2016-03-14 DIAGNOSIS — H35033 Hypertensive retinopathy, bilateral: Secondary | ICD-10-CM | POA: Diagnosis not present

## 2016-03-14 DIAGNOSIS — Z0000 Encounter for general adult medical examination without abnormal findings: Secondary | ICD-10-CM | POA: Diagnosis not present

## 2016-03-14 DIAGNOSIS — Z23 Encounter for immunization: Secondary | ICD-10-CM | POA: Diagnosis not present

## 2016-03-14 DIAGNOSIS — E041 Nontoxic single thyroid nodule: Secondary | ICD-10-CM | POA: Diagnosis not present

## 2016-04-25 DIAGNOSIS — Z1211 Encounter for screening for malignant neoplasm of colon: Secondary | ICD-10-CM | POA: Diagnosis not present

## 2016-05-22 ENCOUNTER — Other Ambulatory Visit: Payer: Self-pay | Admitting: Family Medicine

## 2016-05-22 DIAGNOSIS — E041 Nontoxic single thyroid nodule: Secondary | ICD-10-CM

## 2016-06-11 ENCOUNTER — Ambulatory Visit
Admission: RE | Admit: 2016-06-11 | Discharge: 2016-06-11 | Disposition: A | Discharge status: Home / Self Care | Payer: Medicare Other | Source: Ambulatory Visit | Attending: Family Medicine | Admitting: Family Medicine

## 2016-06-11 DIAGNOSIS — E041 Nontoxic single thyroid nodule: Secondary | ICD-10-CM | POA: Diagnosis not present

## 2016-06-18 DIAGNOSIS — R1313 Dysphagia, pharyngeal phase: Secondary | ICD-10-CM | POA: Diagnosis not present

## 2016-06-18 DIAGNOSIS — E041 Nontoxic single thyroid nodule: Secondary | ICD-10-CM | POA: Diagnosis not present

## 2016-07-09 ENCOUNTER — Other Ambulatory Visit: Payer: Self-pay | Admitting: Gastroenterology

## 2016-07-09 DIAGNOSIS — R1311 Dysphagia, oral phase: Secondary | ICD-10-CM | POA: Diagnosis not present

## 2016-07-09 DIAGNOSIS — R1310 Dysphagia, unspecified: Secondary | ICD-10-CM

## 2016-07-14 ENCOUNTER — Ambulatory Visit
Admission: RE | Admit: 2016-07-14 | Discharge: 2016-07-14 | Disposition: A | Discharge status: Home / Self Care | Payer: Medicare Other | Source: Ambulatory Visit | Attending: Gastroenterology | Admitting: Gastroenterology

## 2016-07-14 DIAGNOSIS — K219 Gastro-esophageal reflux disease without esophagitis: Secondary | ICD-10-CM | POA: Diagnosis not present

## 2016-07-14 DIAGNOSIS — R1310 Dysphagia, unspecified: Secondary | ICD-10-CM

## 2016-07-23 DIAGNOSIS — H6123 Impacted cerumen, bilateral: Secondary | ICD-10-CM | POA: Diagnosis not present

## 2016-07-23 DIAGNOSIS — R1313 Dysphagia, pharyngeal phase: Secondary | ICD-10-CM | POA: Diagnosis not present

## 2016-07-23 DIAGNOSIS — H903 Sensorineural hearing loss, bilateral: Secondary | ICD-10-CM | POA: Diagnosis not present

## 2017-01-08 DIAGNOSIS — H43813 Vitreous degeneration, bilateral: Secondary | ICD-10-CM | POA: Diagnosis not present

## 2017-01-08 DIAGNOSIS — H353133 Nonexudative age-related macular degeneration, bilateral, advanced atrophic without subfoveal involvement: Secondary | ICD-10-CM | POA: Diagnosis not present

## 2017-01-08 DIAGNOSIS — Z961 Presence of intraocular lens: Secondary | ICD-10-CM | POA: Diagnosis not present

## 2017-01-08 DIAGNOSIS — H35033 Hypertensive retinopathy, bilateral: Secondary | ICD-10-CM | POA: Diagnosis not present

## 2017-02-17 DIAGNOSIS — M25512 Pain in left shoulder: Secondary | ICD-10-CM | POA: Diagnosis not present

## 2017-03-27 DIAGNOSIS — D729 Disorder of white blood cells, unspecified: Secondary | ICD-10-CM | POA: Diagnosis not present

## 2017-03-27 DIAGNOSIS — Z0000 Encounter for general adult medical examination without abnormal findings: Secondary | ICD-10-CM | POA: Diagnosis not present

## 2017-03-27 DIAGNOSIS — H35033 Hypertensive retinopathy, bilateral: Secondary | ICD-10-CM | POA: Diagnosis not present

## 2017-03-27 DIAGNOSIS — I10 Essential (primary) hypertension: Secondary | ICD-10-CM | POA: Diagnosis not present

## 2017-04-08 ENCOUNTER — Other Ambulatory Visit: Payer: Self-pay | Admitting: Dermatology

## 2017-04-08 DIAGNOSIS — D229 Melanocytic nevi, unspecified: Secondary | ICD-10-CM | POA: Diagnosis not present

## 2017-04-08 DIAGNOSIS — C44212 Basal cell carcinoma of skin of right ear and external auricular canal: Secondary | ICD-10-CM | POA: Diagnosis not present

## 2017-04-08 DIAGNOSIS — C44319 Basal cell carcinoma of skin of other parts of face: Secondary | ICD-10-CM | POA: Diagnosis not present

## 2017-04-08 DIAGNOSIS — L821 Other seborrheic keratosis: Secondary | ICD-10-CM | POA: Diagnosis not present

## 2017-04-08 DIAGNOSIS — L814 Other melanin hyperpigmentation: Secondary | ICD-10-CM | POA: Diagnosis not present

## 2017-06-01 DIAGNOSIS — Z85828 Personal history of other malignant neoplasm of skin: Secondary | ICD-10-CM | POA: Diagnosis not present

## 2017-06-01 DIAGNOSIS — L905 Scar conditions and fibrosis of skin: Secondary | ICD-10-CM | POA: Diagnosis not present

## 2017-06-09 IMAGING — RF DG ESOPHAGUS
6 of 7 series · 14 of 23 positions shown · non-contrast
Comparison: Esophagram 01/18/2014

CLINICAL DATA: 83-year-old with esophageal dysphagia.

EXAM:
ESOPHOGRAM / BARIUM SWALLOW / BARIUM TABLET STUDY
TECHNIQUE: Combined double contrast and single contrast examination performed
using thick and thin barium liquid. The patient was observed with
fluoroscopy swallowing a 13 mm barium sulphate tablet.
FLUOROSCOPY TIME:  Fluoroscopy Time: 1 minutes and 30 seconds of
low-dose pulsed fluoro.
Radiation Exposure Index (if provided by the fluoroscopic device):
25 mGy
Number of Acquired Spot Images: 0

[Series 1: sequence · 2 of 24 frames shown (1 of 4)]
[frame 1/24]
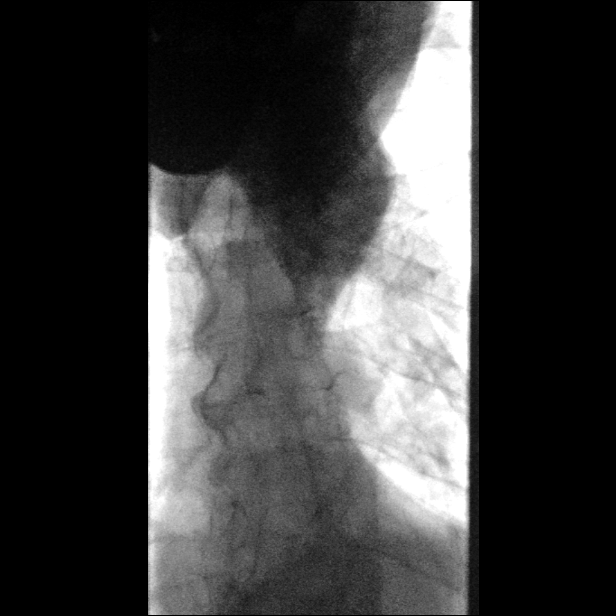
[frame 13/24]
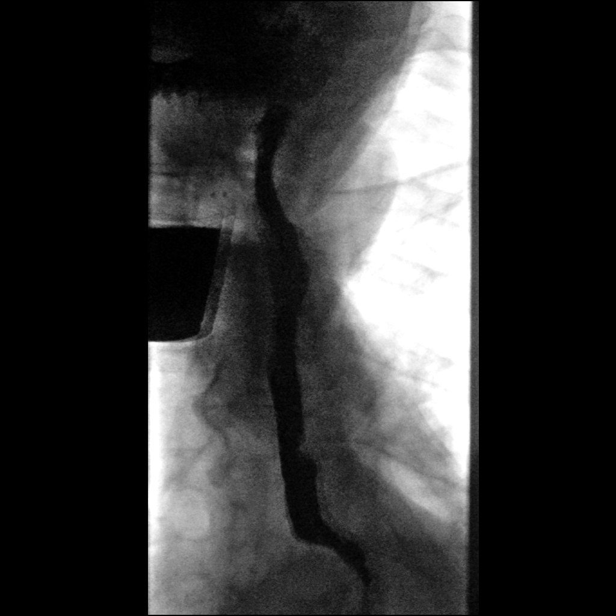

[Series 2: one shot · 2 of 3 slices shown (1 of 2)]
[im 1/3]
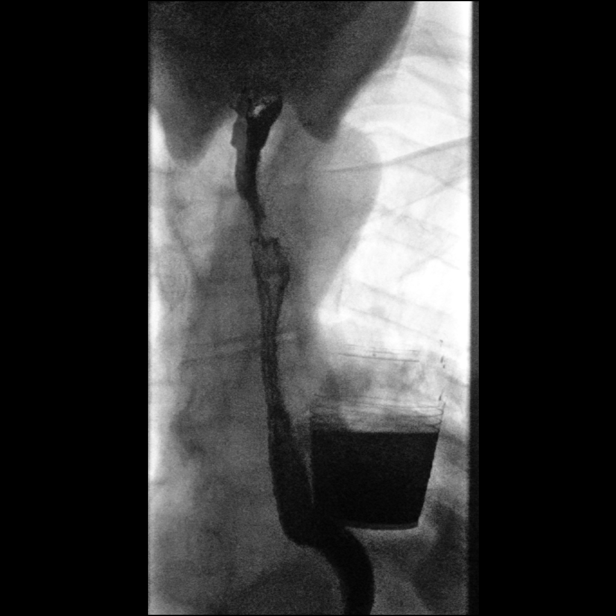
[im 2/3]
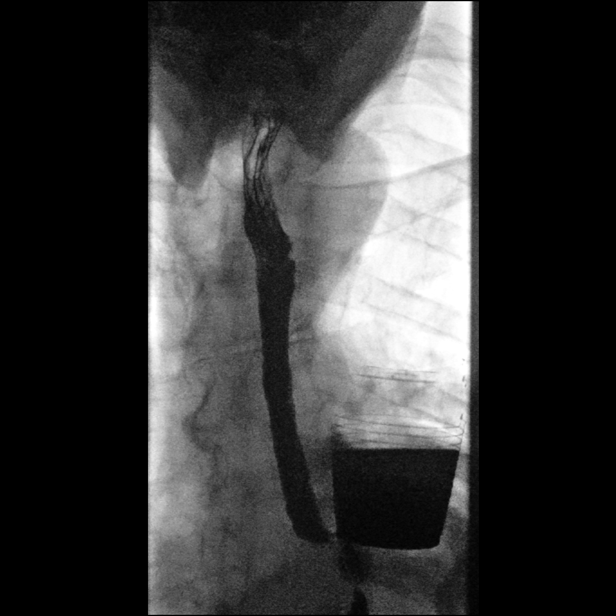

[Series 3: sequence · 3 of 30 frames shown (2 of 4)]
[frame 1/30]
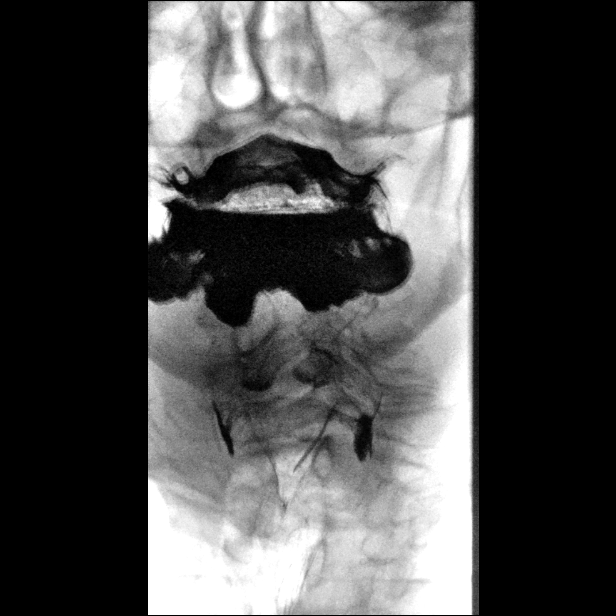
[frame 16/30]
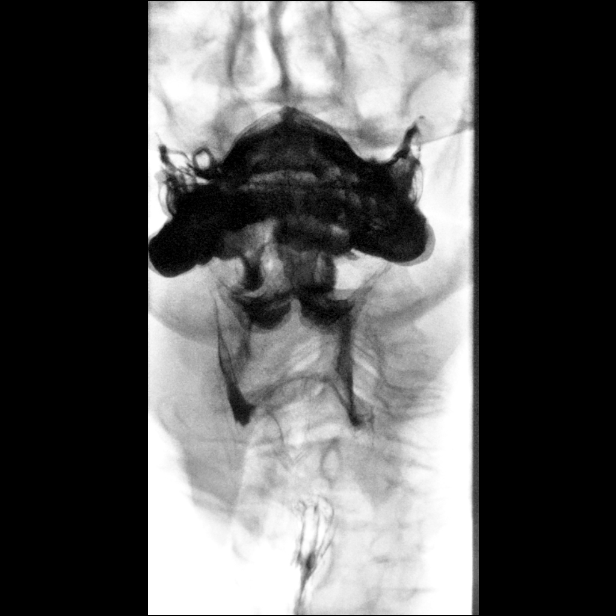
[frame 26/30]
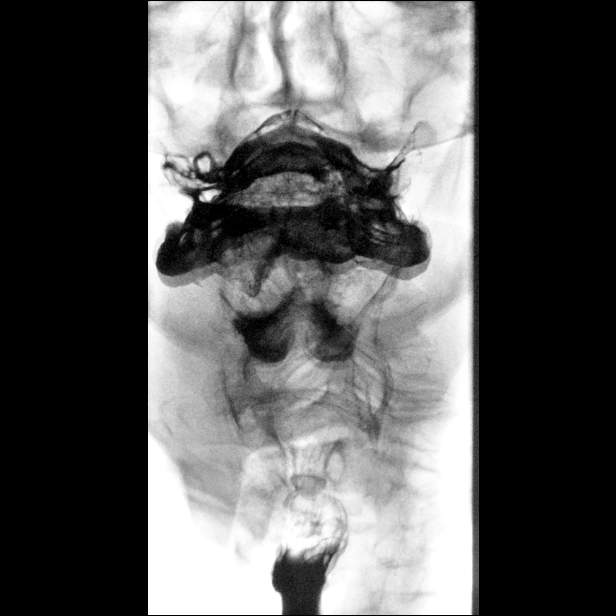

[Series 4: sequence · 2 of 34 frames shown (3 of 4)]
[frame 18/34]
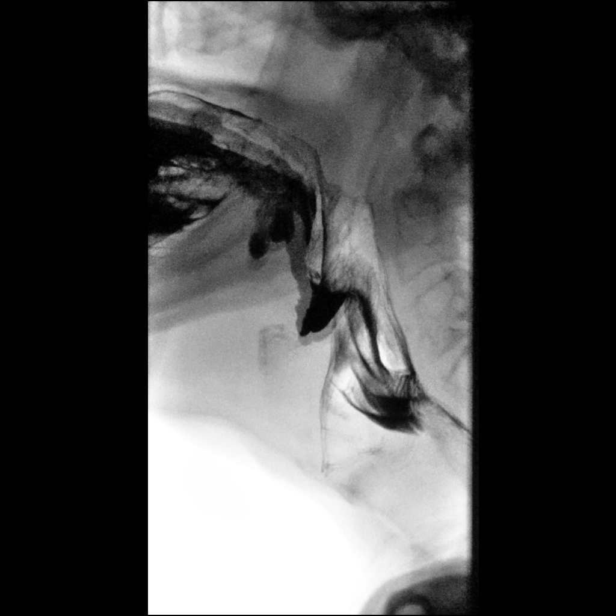
[frame 29/34]
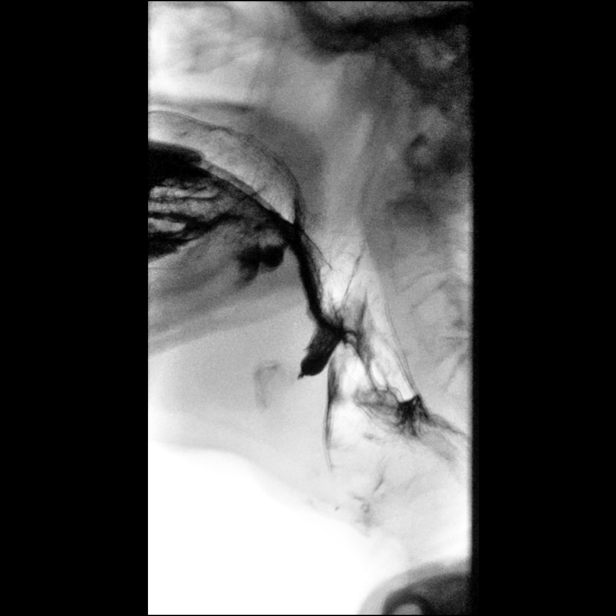

[Series 6: sequence · 3 of 24 frames shown (4 of 4)]
[frame 2/24]
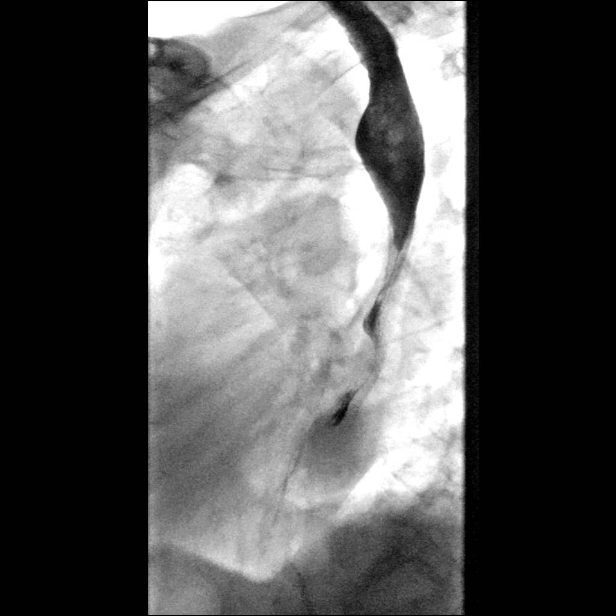
[frame 13/24]
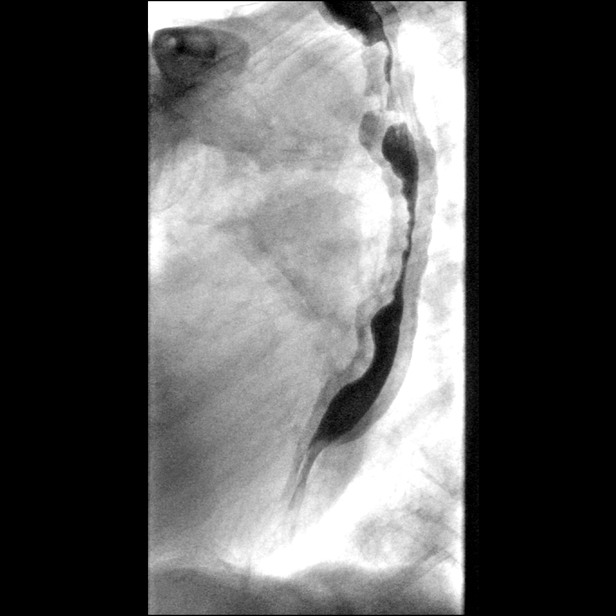
[frame 21/24]
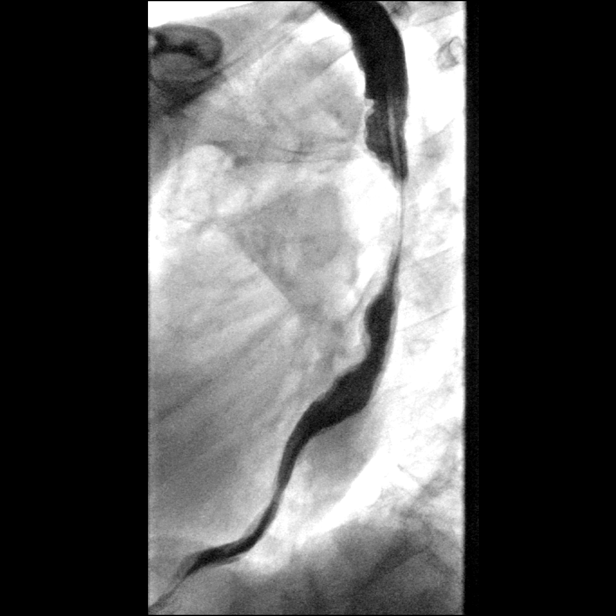

[Series 7: one shot · 2 of 4 slices shown (2 of 2)]
[im 2/4]
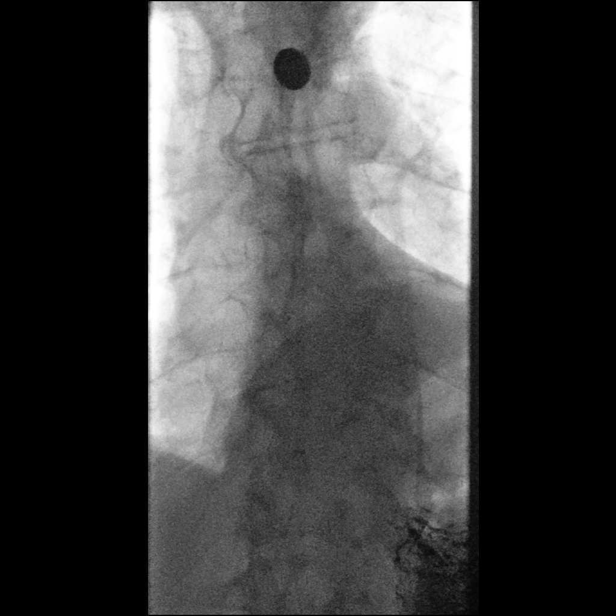
[im 4/4]
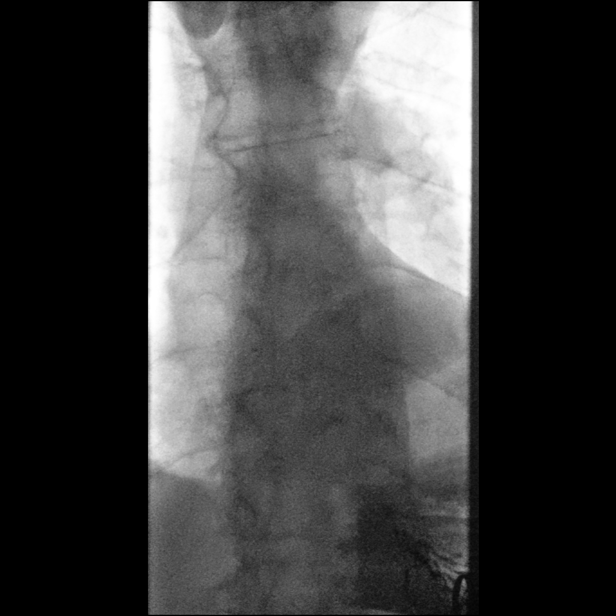

[14 of 23 positions shown; findings below may reference images not displayed]

FINDINGS: There is mild esophageal dysmotility with increased tertiary
contractions. Rapid sequence imaging of the pharynx in the AP and
lateral projections demonstrates laryngeal penetration, but no
definite aspiration into the trachea. This was silent. No pharyngeal
mucosal abnormalities are seen.

There is no evidence of focal esophageal mucosal lesion, stricture
or ulceration. There is no significant hiatal hernia or reflux with
the water siphon test.

A 13 mm barium tablet was administered and passed with minimal delay
into the stomach.
IMPRESSION: 1. Silent laryngeal penetration without aspiration.
2. Stable mild presbyesophagus
3. No evidence of esophageal stricture or ulceration.

## 2018-01-21 DIAGNOSIS — H43813 Vitreous degeneration, bilateral: Secondary | ICD-10-CM | POA: Diagnosis not present

## 2018-01-21 DIAGNOSIS — H353133 Nonexudative age-related macular degeneration, bilateral, advanced atrophic without subfoveal involvement: Secondary | ICD-10-CM | POA: Diagnosis not present

## 2018-01-21 DIAGNOSIS — H35033 Hypertensive retinopathy, bilateral: Secondary | ICD-10-CM | POA: Diagnosis not present

## 2018-01-21 DIAGNOSIS — Z961 Presence of intraocular lens: Secondary | ICD-10-CM | POA: Diagnosis not present

## 2018-02-03 DIAGNOSIS — L819 Disorder of pigmentation, unspecified: Secondary | ICD-10-CM | POA: Diagnosis not present

## 2018-02-03 DIAGNOSIS — L570 Actinic keratosis: Secondary | ICD-10-CM | POA: Diagnosis not present

## 2018-02-03 DIAGNOSIS — C44212 Basal cell carcinoma of skin of right ear and external auricular canal: Secondary | ICD-10-CM | POA: Diagnosis not present

## 2018-02-03 DIAGNOSIS — D485 Neoplasm of uncertain behavior of skin: Secondary | ICD-10-CM | POA: Diagnosis not present

## 2018-04-13 DIAGNOSIS — E782 Mixed hyperlipidemia: Secondary | ICD-10-CM | POA: Diagnosis not present

## 2018-04-13 DIAGNOSIS — I10 Essential (primary) hypertension: Secondary | ICD-10-CM | POA: Diagnosis not present

## 2018-04-13 DIAGNOSIS — Z0000 Encounter for general adult medical examination without abnormal findings: Secondary | ICD-10-CM | POA: Diagnosis not present

## 2018-04-13 DIAGNOSIS — E041 Nontoxic single thyroid nodule: Secondary | ICD-10-CM | POA: Diagnosis not present

## 2018-04-13 DIAGNOSIS — Z1211 Encounter for screening for malignant neoplasm of colon: Secondary | ICD-10-CM | POA: Diagnosis not present

## 2019-01-24 DIAGNOSIS — H35371 Puckering of macula, right eye: Secondary | ICD-10-CM | POA: Diagnosis not present

## 2019-01-24 DIAGNOSIS — H353133 Nonexudative age-related macular degeneration, bilateral, advanced atrophic without subfoveal involvement: Secondary | ICD-10-CM | POA: Diagnosis not present

## 2019-01-24 DIAGNOSIS — Z961 Presence of intraocular lens: Secondary | ICD-10-CM | POA: Diagnosis not present

## 2019-01-24 DIAGNOSIS — H04123 Dry eye syndrome of bilateral lacrimal glands: Secondary | ICD-10-CM | POA: Diagnosis not present

## 2019-03-31 DIAGNOSIS — Z23 Encounter for immunization: Secondary | ICD-10-CM | POA: Diagnosis not present

## 2019-04-27 DIAGNOSIS — Z0000 Encounter for general adult medical examination without abnormal findings: Secondary | ICD-10-CM | POA: Diagnosis not present

## 2019-04-27 DIAGNOSIS — H35033 Hypertensive retinopathy, bilateral: Secondary | ICD-10-CM | POA: Diagnosis not present

## 2019-04-27 DIAGNOSIS — H3530 Unspecified macular degeneration: Secondary | ICD-10-CM | POA: Diagnosis not present

## 2019-05-05 DIAGNOSIS — I10 Essential (primary) hypertension: Secondary | ICD-10-CM | POA: Diagnosis not present

## 2019-05-05 DIAGNOSIS — E041 Nontoxic single thyroid nodule: Secondary | ICD-10-CM | POA: Diagnosis not present

## 2019-05-05 DIAGNOSIS — E782 Mixed hyperlipidemia: Secondary | ICD-10-CM | POA: Diagnosis not present

## 2019-05-10 ENCOUNTER — Other Ambulatory Visit: Payer: Self-pay | Admitting: Family Medicine

## 2019-05-10 DIAGNOSIS — Z13820 Encounter for screening for osteoporosis: Secondary | ICD-10-CM

## 2019-07-03 ENCOUNTER — Ambulatory Visit: Payer: Medicare Other | Attending: Internal Medicine

## 2019-07-03 DIAGNOSIS — Z23 Encounter for immunization: Secondary | ICD-10-CM | POA: Insufficient documentation

## 2019-07-03 NOTE — Progress Notes (Signed)
   Covid-19 Vaccination Clinic  Name:  [name]    MRN: [id number] DOB: [birth year]  07/03/2019  [name] was observed post Covid-19 immunization for 15 minutes without incidence. He was provided with Vaccine Information Sheet and instruction to access the V-Safe system.   [name] was instructed to call 911 with any severe reactions post vaccine: Marland Kitchen Difficulty breathing  . Swelling of your face and throat  . A fast heartbeat  . A bad rash all over your body  . Dizziness and weakness    Immunizations Administered    Name Date Dose VIS Date Route   Pfizer COVID-19 Vaccine 07/03/2019 12:26 PM 0.3 mL 04/22/2019 Intramuscular   Manufacturer: [employer], [employer]   Lot: [id number]   NDC: [address]-8

## 2019-07-27 ENCOUNTER — Ambulatory Visit: Payer: Medicare Other | Attending: Internal Medicine

## 2019-07-27 DIAGNOSIS — Z23 Encounter for immunization: Secondary | ICD-10-CM

## 2019-07-27 NOTE — Progress Notes (Signed)
   Covid-19 Vaccination Clinic  Name:  ANVITH MAURIELLO    MRN: [id number] DOB: 05/19/1932  07/27/2019  Mr. Gauthreaux was observed post Covid-19 immunization for 15 minutes without incident. He was provided with Vaccine Information Sheet and instruction to access the V-Safe system.   Mr. Causer was instructed to call 911 with any severe reactions post vaccine: Marland Kitchen Difficulty breathing  . Swelling of face and throat  . A fast heartbeat  . A bad rash all over body  . Dizziness and weakness   Immunizations Administered    Name Date Dose VIS Date Route   Pfizer COVID-19 Vaccine 07/27/2019 11:08 AM 0.3 mL 04/22/2019 Intramuscular   Manufacturer: [employer], [employer]   Lot: [id number]   NDC: [id number]

## 2019-08-15 ENCOUNTER — Other Ambulatory Visit: Payer: Self-pay

## 2019-08-15 ENCOUNTER — Ambulatory Visit
Admission: RE | Admit: 2019-08-15 | Discharge: 2019-08-15 | Disposition: A | Discharge status: Home / Self Care | Payer: Medicare Other | Source: Ambulatory Visit | Attending: Family Medicine | Admitting: Family Medicine

## 2019-08-15 DIAGNOSIS — Z13820 Encounter for screening for osteoporosis: Secondary | ICD-10-CM

## 2019-08-15 DIAGNOSIS — M8589 Other specified disorders of bone density and structure, multiple sites: Secondary | ICD-10-CM | POA: Diagnosis not present

## 2019-11-15 DIAGNOSIS — H353133 Nonexudative age-related macular degeneration, bilateral, advanced atrophic without subfoveal involvement: Secondary | ICD-10-CM | POA: Diagnosis not present

## 2020-04-30 DIAGNOSIS — E782 Mixed hyperlipidemia: Secondary | ICD-10-CM | POA: Diagnosis not present

## 2020-04-30 DIAGNOSIS — I10 Essential (primary) hypertension: Secondary | ICD-10-CM | POA: Diagnosis not present

## 2020-04-30 DIAGNOSIS — E041 Nontoxic single thyroid nodule: Secondary | ICD-10-CM | POA: Diagnosis not present

## 2020-05-02 DIAGNOSIS — H3530 Unspecified macular degeneration: Secondary | ICD-10-CM | POA: Diagnosis not present

## 2020-05-02 DIAGNOSIS — Z23 Encounter for immunization: Secondary | ICD-10-CM | POA: Diagnosis not present

## 2020-05-02 DIAGNOSIS — H35033 Hypertensive retinopathy, bilateral: Secondary | ICD-10-CM | POA: Diagnosis not present

## 2020-05-02 DIAGNOSIS — Z0000 Encounter for general adult medical examination without abnormal findings: Secondary | ICD-10-CM | POA: Diagnosis not present

## 2020-06-26 ENCOUNTER — Other Ambulatory Visit: Payer: Self-pay | Admitting: Family Medicine

## 2020-06-26 DIAGNOSIS — L03119 Cellulitis of unspecified part of limb: Secondary | ICD-10-CM | POA: Diagnosis not present

## 2020-07-05 ENCOUNTER — Other Ambulatory Visit: Payer: Self-pay | Admitting: Family Medicine

## 2020-07-05 DIAGNOSIS — L03039 Cellulitis of unspecified toe: Secondary | ICD-10-CM | POA: Diagnosis not present

## 2020-07-05 DIAGNOSIS — M79672 Pain in left foot: Secondary | ICD-10-CM

## 2020-07-05 DIAGNOSIS — M79671 Pain in right foot: Secondary | ICD-10-CM

## 2020-07-06 ENCOUNTER — Other Ambulatory Visit: Payer: Self-pay | Admitting: Family Medicine

## 2020-07-06 ENCOUNTER — Ambulatory Visit
Admission: RE | Admit: 2020-07-06 | Discharge: 2020-07-06 | Disposition: A | Discharge status: Home / Self Care | Payer: Medicare Other | Source: Ambulatory Visit | Attending: Family Medicine | Admitting: Family Medicine

## 2020-07-06 ENCOUNTER — Other Ambulatory Visit: Payer: Self-pay

## 2020-07-06 DIAGNOSIS — M79672 Pain in left foot: Secondary | ICD-10-CM | POA: Diagnosis not present

## 2020-07-06 DIAGNOSIS — M79671 Pain in right foot: Secondary | ICD-10-CM | POA: Diagnosis not present

## 2020-07-11 DIAGNOSIS — I872 Venous insufficiency (chronic) (peripheral): Secondary | ICD-10-CM | POA: Diagnosis not present

## 2020-07-26 DIAGNOSIS — Z961 Presence of intraocular lens: Secondary | ICD-10-CM | POA: Diagnosis not present

## 2020-07-26 DIAGNOSIS — H353133 Nonexudative age-related macular degeneration, bilateral, advanced atrophic without subfoveal involvement: Secondary | ICD-10-CM | POA: Diagnosis not present

## 2020-07-26 DIAGNOSIS — H04123 Dry eye syndrome of bilateral lacrimal glands: Secondary | ICD-10-CM | POA: Diagnosis not present

## 2020-08-09 DIAGNOSIS — G629 Polyneuropathy, unspecified: Secondary | ICD-10-CM | POA: Diagnosis not present

## 2020-09-25 DIAGNOSIS — M792 Neuralgia and neuritis, unspecified: Secondary | ICD-10-CM | POA: Diagnosis not present

## 2020-09-25 DIAGNOSIS — M19071 Primary osteoarthritis, right ankle and foot: Secondary | ICD-10-CM | POA: Diagnosis not present

## 2020-10-10 DIAGNOSIS — M5431 Sciatica, right side: Secondary | ICD-10-CM | POA: Diagnosis not present

## 2020-10-16 ENCOUNTER — Other Ambulatory Visit: Payer: Self-pay | Admitting: Family Medicine

## 2020-10-16 DIAGNOSIS — M5431 Sciatica, right side: Secondary | ICD-10-CM

## 2020-10-18 ENCOUNTER — Other Ambulatory Visit: Payer: Self-pay

## 2020-10-18 ENCOUNTER — Ambulatory Visit
Admission: RE | Admit: 2020-10-18 | Discharge: 2020-10-18 | Disposition: A | Discharge status: Home / Self Care | Payer: Medicare Other | Source: Ambulatory Visit | Attending: Family Medicine | Admitting: Family Medicine

## 2020-10-18 DIAGNOSIS — M5126 Other intervertebral disc displacement, lumbar region: Secondary | ICD-10-CM | POA: Diagnosis not present

## 2020-10-18 DIAGNOSIS — M47816 Spondylosis without myelopathy or radiculopathy, lumbar region: Secondary | ICD-10-CM | POA: Diagnosis not present

## 2020-10-18 DIAGNOSIS — M5125 Other intervertebral disc displacement, thoracolumbar region: Secondary | ICD-10-CM | POA: Diagnosis not present

## 2020-10-18 DIAGNOSIS — M5127 Other intervertebral disc displacement, lumbosacral region: Secondary | ICD-10-CM | POA: Diagnosis not present

## 2020-10-18 DIAGNOSIS — M5431 Sciatica, right side: Secondary | ICD-10-CM

## 2020-10-24 DIAGNOSIS — M79671 Pain in right foot: Secondary | ICD-10-CM | POA: Diagnosis not present

## 2020-10-24 DIAGNOSIS — M545 Low back pain, unspecified: Secondary | ICD-10-CM | POA: Diagnosis not present

## 2020-11-14 DIAGNOSIS — R102 Pelvic and perineal pain: Secondary | ICD-10-CM | POA: Diagnosis not present

## 2020-11-19 DIAGNOSIS — N320 Bladder-neck obstruction: Secondary | ICD-10-CM | POA: Diagnosis not present

## 2020-11-19 DIAGNOSIS — M545 Low back pain, unspecified: Secondary | ICD-10-CM | POA: Diagnosis not present

## 2020-11-29 DIAGNOSIS — Z5181 Encounter for therapeutic drug level monitoring: Secondary | ICD-10-CM | POA: Diagnosis not present

## 2020-11-29 DIAGNOSIS — M899 Disorder of bone, unspecified: Secondary | ICD-10-CM | POA: Diagnosis not present

## 2020-11-29 DIAGNOSIS — E559 Vitamin D deficiency, unspecified: Secondary | ICD-10-CM | POA: Diagnosis not present

## 2020-11-29 DIAGNOSIS — R399 Unspecified symptoms and signs involving the genitourinary system: Secondary | ICD-10-CM | POA: Diagnosis not present

## 2020-11-29 DIAGNOSIS — E538 Deficiency of other specified B group vitamins: Secondary | ICD-10-CM | POA: Diagnosis not present

## 2021-01-17 DIAGNOSIS — L89513 Pressure ulcer of right ankle, stage 3: Secondary | ICD-10-CM | POA: Diagnosis not present

## 2021-01-17 DIAGNOSIS — G629 Polyneuropathy, unspecified: Secondary | ICD-10-CM | POA: Diagnosis not present

## 2021-01-17 DIAGNOSIS — I10 Essential (primary) hypertension: Secondary | ICD-10-CM | POA: Diagnosis not present

## 2021-01-28 ENCOUNTER — Encounter (HOSPITAL_BASED_OUTPATIENT_CLINIC_OR_DEPARTMENT_OTHER): Payer: Medicare Other | Attending: Internal Medicine | Admitting: Internal Medicine

## 2021-01-28 ENCOUNTER — Other Ambulatory Visit: Payer: Self-pay

## 2021-01-28 DIAGNOSIS — Z833 Family history of diabetes mellitus: Secondary | ICD-10-CM | POA: Diagnosis not present

## 2021-01-28 DIAGNOSIS — M1990 Unspecified osteoarthritis, unspecified site: Secondary | ICD-10-CM | POA: Insufficient documentation

## 2021-01-28 DIAGNOSIS — S91301A Unspecified open wound, right foot, initial encounter: Secondary | ICD-10-CM | POA: Diagnosis not present

## 2021-01-28 DIAGNOSIS — I10 Essential (primary) hypertension: Secondary | ICD-10-CM

## 2021-01-28 DIAGNOSIS — X58XXXA Exposure to other specified factors, initial encounter: Secondary | ICD-10-CM | POA: Diagnosis not present

## 2021-01-28 NOTE — Progress Notes (Signed)
[name], [name] ([id number]) Visit Report for 01/28/2021 Abuse/Suicide Risk Screen Details Patient Name: Date of Service: [name], [name] 01/28/2021 1:15 PM Medical Record Number: [id number] Patient Account Number: [id number] Date of Birth/Sex: Treating RN: Dec 20, 1932 (87 y.o. [name] Primary Care Carolos Fecher: Shirlean Mylar Other Clinician: Referring Francia Verry: Treating Capria Cartaya/Extender: Donato Heinz in Treatment: 0 Abuse/Suicide Risk Screen Items Answer ABUSE RISK SCREEN: Has anyone close to you tried to hurt or harm you recentlyo No Do you feel uncomfortable with anyone in your familyo No Has anyone forced you do things that you didnt want to doo No Electronic Signature(s) Signed: 01/28/2021 4:58:58 PM By: [name] RN, BSN Entered By: [name] on 01/28/2021 13:33:00 -------------------------------------------------------------------------------- Activities of Daily Living Details Patient Name: Date of Service: [name], [name] 01/28/2021 1:15 PM Medical Record Number: [id number] Patient Account Number: [id number] Date of Birth/Sex: Treating RN: 01-01-1933 (87 y.o. [name] Primary Care Ora Mcnatt: Shirlean Mylar Other Clinician: Referring Diem Pagnotta: Treating Kamora Vossler/Extender: Donato Heinz in Treatment: 0 Activities of Daily Living Items Answer Activities of Daily Living (Please select one for each item) Drive Automobile Completely Able T Medications ake Completely Able Use T elephone Completely Able Care for Appearance Completely Able Use T oilet Completely Able Bath / Shower Completely Able Dress Self Completely Able Feed Self Completely Able Walk Completely Able Get In / Out Bed Completely Able Housework Completely Able Prepare Meals Completely Able Handle Money Completely Able Shop for Self Completely Able Electronic Signature(s) Signed: 01/28/2021 4:58:58 PM By: [name] RN, BSN Entered By:  [name] on 01/28/2021 13:33:30 -------------------------------------------------------------------------------- Education Screening Details Patient Name: Date of Service: [name] Chimera. 01/28/2021 1:15 PM Medical Record Number: [id number] Patient Account Number: [id number] Date of Birth/Sex: Treating RN: [birth year] (87 y.o. [name] Primary Care Lisbeth Puller: Shirlean Mylar Other Clinician: Referring Delante Karapetyan: Treating Moana Munford/Extender: Donato Heinz in Treatment: 0 Primary Learner Assessed: Patient Learning Preferences/Education Level/Primary Language Learning Preference: Explanation, Demonstration, Printed Material Highest Education Level: College or Above Preferred Language: English Cognitive Barrier Language Barrier: No Translator Needed: No Memory Deficit: No Emotional Barrier: No Cultural/Religious Beliefs Affecting Medical Care: No Physical Barrier Impaired Vision: Yes Glasses Impaired Hearing: Yes Hearing Aid, both ears Decreased Hand dexterity: No Knowledge/Comprehension Knowledge Level: High Comprehension Level: High Ability to understand written instructions: High Ability to understand verbal instructions: High Motivation Anxiety Level: Calm Cooperation: Cooperative Education Importance: Acknowledges Need Interest in Health Problems: Asks Questions Perception: Coherent Willingness to Engage in Self-Management High Activities: Readiness to Engage in Self-Management High Activities: Electronic Signature(s) Signed: 01/28/2021 4:58:58 PM By: [name] RN, BSN Entered By: [name] on 01/28/2021 13:34:29 -------------------------------------------------------------------------------- Fall Risk Assessment Details Patient Name: Date of Service: [name] Chimera 01/28/2021 1:15 PM Medical Record Number: [id number] Patient Account Number: [id number] Date of Birth/Sex: Treating RN: Jun 27, 1932 (87 y.o. [name] Primary Care Emelynn Rance: Shirlean Mylar Other Clinician: Referring Dishawn Bhargava: Treating Tora Prunty/Extender: Donato Heinz in Treatment: 0 Fall Risk Assessment Items Have you had 2 or more falls in the last 12 monthso 0 No Have you had any fall that resulted in injury in the last 12 monthso 0 No FALLS RISK SCREEN History of falling - immediate or within 3 months 0 No Secondary diagnosis (Do you have 2 or more medical diagnoseso) 0 No Ambulatory aid None/bed rest/wheelchair/nurse 0 Yes Crutches/cane/walker 0 No Furniture 0 No Intravenous therapy Access/Saline/Heparin Lock 0 No Gait/Transferring Normal/ bed rest/ wheelchair  0 Yes Weak (short steps with or without shuffle, stooped but able to lift head while walking, may seek 0 No support from furniture) Impaired (short steps with shuffle, may have difficulty arising from chair, head down, impaired 0 No balance) Mental Status Oriented to own ability 0 Yes Electronic Signature(s) Signed: 01/28/2021 4:58:58 PM By: [name] RN, BSN Entered By: [name] on 01/28/2021 13:34:42 -------------------------------------------------------------------------------- Foot Assessment Details Patient Name: Date of Service: [name] Chimera. 01/28/2021 1:15 PM Medical Record Number: [id number] Patient Account Number: [id number] Date of Birth/Sex: Treating RN: 07/06/1932 (87 y.o. [name] Primary Care Galena Logie: Shirlean Mylar Other Clinician: Referring Darrek Leasure: Treating Kimika Streater/Extender: Donato Heinz in Treatment: 0 Foot Assessment Items Site Locations + = Sensation present, - = Sensation absent, C = Callus, U = Ulcer R = Redness, W = Warmth, M = Maceration, PU = Pre-ulcerative lesion F = Fissure, S = Swelling, D = Dryness Assessment Right: Left: Other Deformity: No No Prior Foot Ulcer: No No Prior Amputation: No No Charcot Joint: No No Ambulatory Status: Ambulatory Without  Help Gait: Steady Electronic Signature(s) Signed: 01/28/2021 4:58:58 PM By: [name] RN, BSN Entered By: [name] on 01/28/2021 13:38:44 -------------------------------------------------------------------------------- Nutrition Risk Screening Details Patient Name: Date of Service: [name], [name] 01/28/2021 1:15 PM Medical Record Number: [id number] Patient Account Number: [id number] Date of Birth/Sex: Treating RN: 06-02-1932 (87 y.o. [name] Primary Care Lyonel Morejon: Shirlean Mylar Other Clinician: Referring Lachina Salsberry: Treating Myrick Mcnairy/Extender: Donato Heinz in Treatment: 0 Height (in): 60 Weight (lbs): 122 Body Mass Index (BMI): 23.8 Nutrition Risk Screening Items Score Screening NUTRITION RISK SCREEN: I have an illness or condition that made me change the kind and/or amount of food I eat 0 No I eat fewer than two meals per day 0 No I eat few fruits and vegetables, or milk products 0 No I have three or more drinks of beer, liquor or wine almost every day 0 No I have tooth or mouth problems that make it hard for me to eat 2 Yes I don't always have enough money to buy the food I need 0 No I eat alone most of the time 0 No I take three or more different prescribed or over-the-counter drugs a day 0 No Without wanting to, I have lost or gained 10 pounds in the last six months 0 No I am not always physically able to shop, cook and/or feed myself 0 No Nutrition Protocols Good Risk Protocol 0 No interventions needed Moderate Risk Protocol High Risk Proctocol Risk Level: Good Risk Score: 2 Electronic Signature(s) Signed: 01/28/2021 4:58:58 PM By: [name] RN, BSN Entered By: [name] on 01/28/2021 13:37:03

## 2021-01-28 NOTE — Progress Notes (Addendum)
[name], [name] ([id number]) Visit Report for 01/28/2021 Allergy List Details Patient Name: Date of Service: [name], [name] 01/28/2021 1:15 PM Medical Record Number: [id number] Patient Account Number: [id number] Date of Birth/Sex: Treating RN: 1932-11-17 (87 y.o. [name] Primary Care [name]: [name] Other Clinician: Referring [name]: Treating [name]/Extender: [name] in Treatment: 0 Allergies Active Allergies prednisone Reaction: hematuria Allergy Notes Electronic Signature(s) Signed: 01/28/2021 4:58:58 PM By: [name] Deed RN, BSN Entered By: [name] on 01/28/2021 13:23:56 -------------------------------------------------------------------------------- Arrival Information Details Patient Name: Date of Service: [name] 01/28/2021 1:15 PM Medical Record Number: [id number] Patient Account Number: [id number] Date of Birth/Sex: Treating RN: [birth year] (87 y.o. [name] Primary Care [name]: [name] Other Clinician: Referring [name]: Treating [name]/Extender: [name] in Treatment: 0 Visit Information Patient Arrived: Ambulatory Arrival Time: 13:11 Accompanied By: spouse Transfer Assistance: None Patient Identification Verified: Yes Secondary Verification Process Completed: Yes Patient Requires Transmission-Based Precautions: No Patient Has Alerts: Yes Patient Alerts: R ABI=1.12, L ABI=1.13 Electronic Signature(s) Signed: 01/28/2021 4:58:58 PM By: [name] Deed RN, BSN Entered By: [name] on 01/28/2021 13:52:23 -------------------------------------------------------------------------------- Clinic Level of Care Assessment Details Patient Name: Date of Service: [name], [name] 01/28/2021 1:15 PM Medical Record Number: [id number] Patient Account Number: [id number] Date of Birth/Sex: Treating RN: May 22, 1932 (87 y.o. [name] Primary Care [name]: [name] Other Clinician: Referring [name]: Treating [name]/Extender: [name] in Treatment: 0 Clinic Level of Care Assessment Items TOOL 1 Quantity Score []  - 0 Use when EandM and Procedure is performed on INITIAL visit ASSESSMENTS - Nursing Assessment / Reassessment X- 1 20 General Physical Exam (combine w/ comprehensive assessment (listed just below) when performed on new pt. evals) X- 1 25 Comprehensive Assessment (HX, ROS, Risk Assessments, Wounds Hx, etc.) ASSESSMENTS - Wound and Skin Assessment / Reassessment []  - 0 Dermatologic / Skin Assessment (not related to wound area) ASSESSMENTS - Ostomy and/or Continence Assessment and Care []  - 0 Incontinence Assessment and Management []  - 0 Ostomy Care Assessment and Management (repouching, etc.) PROCESS - Coordination of Care X - Simple Patient / Family Education for ongoing care 1 15 []  - 0 Complex (extensive) Patient / Family Education for ongoing care X- 1 10 Staff obtains , Records, T Results / Process Orders est []  - 0 Staff telephones HHA, Nursing Homes / Clarify orders / etc []  - 0 Routine Transfer to another Facility (non-emergent condition) []  - 0 Routine Hospital Admission (non-emergent condition) X- 1 15 New Admissions / / Ordering NPWT Apligraf, etc. , []  - 0 Emergency Hospital Admission (emergent condition) PROCESS - Special Needs []  - 0 Pediatric / Minor Patient Management []  - 0 Isolation Patient Management []  - 0 Hearing / Language / Visual special needs []  - 0 Assessment of Community assistance (transportation, D/C planning, etc.) []  - 0 Additional assistance / Altered mentation []  - 0 Support Surface(s) Assessment (bed, cushion, seat, etc.) INTERVENTIONS - Miscellaneous []  - 0 External ear exam []  - 0 Patient Transfer (multiple staff / / Similar devices) []  - 0 Simple Staple / Suture removal (25 or less) []  -  0 Complex Staple / Suture removal (26 or more) []  - 0 Hypo/Hyperglycemic Management (do not check if billed separately) []  - 0 Ankle / Brachial Index (ABI) - do not check if billed separately Has the patient been seen at the hospital within the last three years: Yes Total Score: 85  Level Of Care: New/Established - Level 3 Electronic Signature(s) Signed: 01/28/2021 4:58:58 PM By: [name] Deed RN, BSN Entered By: [name] on 01/28/2021 14:21:18 -------------------------------------------------------------------------------- Lower Extremity Assessment Details Patient Name: Date of Service: [name], [name] 01/28/2021 1:15 PM Medical Record Number: [id number] Patient Account Number: [id number] Date of Birth/Sex: Treating RN: 08-25-1932 (87 y.o. [name] Primary Care [name]: [name] Other Clinician: Referring Nakea Gouger: Treating [name]/Extender: [name] in Treatment: 0 Edema Assessment Assessed: [Left: No] [Right: No] Edema: [Left: N] [Right: o] Calf Left: Right: Point of Measurement: From Medial Instep 29.6 cm Ankle Left: Right: Point of Measurement: From Medial Instep 18 cm Vascular Assessment Pulses: Dorsalis Pedis Palpable: [Right:Yes] Electronic Signature(s) Signed: 01/28/2021 4:58:58 PM By: [name] Deed RN, BSN Entered By: [name] on 01/28/2021 13:40:44 -------------------------------------------------------------------------------- Multi Wound Chart Details Patient Name: Date of Service: [name]. 01/28/2021 1:15 PM Medical Record Number: [id number] Patient Account Number: [id number] Date of Birth/Sex: Treating RN: [birth year] (87 y.o. [name] Primary Care Briza Bark: [name] Other Clinician: Referring [name]: Treating [name]/Extender: [name] in Treatment: 0 Vital Signs Height(in): 60 Pulse(bpm): 81 Weight(lbs): 122 Blood Pressure(mmHg):  162/89 Body Mass Index(BMI): 24 Temperature(F): 98.2 Respiratory Rate(breaths/min): 18 Photos: [1:Right, Lateral Achilles] [N/A:N/A N/A] Wound Location: [1:Gradually Appeared] [N/A:N/A] Wounding Event: [1:Atypical] [N/A:N/A] Primary Etiology: [1:Cataracts, Hypertension,] [N/A:N/A] Comorbid History: [1:Osteoarthritis 05/16/2020] [N/A:N/A] Date A cquired: [1:0] [N/A:N/A] Weeks of Treatment: [1:Open] [N/A:N/A] Wound Status: [1:1.1x1.3x0.1] [N/A:N/A] Measurements L x W x D (cm) [1:1.123] [N/A:N/A] A (cm) : rea [1:0.112] [N/A:N/A] Volume (cm) : [1:0.00%] [N/A:N/A] % Reduction in A [1:rea: 0.00%] [N/A:N/A] % Reduction in Volume: [1:Full Thickness Without Exposed] [N/A:N/A] Classification: [1:Support Structures Medium] [N/A:N/A] Exudate A mount: [1:Serous] [N/A:N/A] Exudate Type: [1:amber] [N/A:N/A] Exudate Color: [1:Flat and Intact] [N/A:N/A] Wound Margin: [1:Small (1-33%)] [N/A:N/A] Granulation A mount: [1:Pink] [N/A:N/A] Granulation Quality: [1:Large (67-100%)] [N/A:N/A] Necrotic A mount: [1:Fat Layer (Subcutaneous Tissue): Yes N/A] Exposed Structures: [1:Fascia: No Tendon: No Muscle: No Joint: No Bone: No None] [N/A:N/A] Epithelialization: [1:Debridement - Selective/Open Wound N/A] Debridement: [1:Lidocaine 4% T opical Solution] [N/A:N/A] Pain Control: [1:Slough] [N/A:N/A] Tissue Debrided: [1:Non-Viable Tissue] [N/A:N/A] Level: [1:1.43] [N/A:N/A] Debridement A (sq cm): [1:rea Curette, N/A] [N/A:N/A] Instrument: [1:None] [N/A:N/A] Bleeding: [1:0] [N/A:N/A] Procedural Pain: [1:0] [N/A:N/A] Post Procedural Pain: Debridement Treatment Response: Procedure was tolerated well [N/A:N/A] Post Debridement Measurements L x 1.1x1.3x0.1 [N/A:N/A] W x D (cm) [1:0.112] [N/A:N/A] Post Debridement Volume: (cm) [1:Debridement] [N/A:N/A] Treatment Notes Electronic Signature(s) Signed: 01/29/2021 4:35:55 PM By: Geralyn Corwin DO Signed: 01/29/2021 5:09:48 PM By: Antonieta Iba Entered By:  Geralyn Corwin on 01/29/2021 16:28:49 -------------------------------------------------------------------------------- Multi-Disciplinary Care Plan Details Patient Name: Date of Service: [name]. 01/28/2021 1:15 PM Medical Record Number: [id number] Patient Account Number: [id number] Date of Birth/Sex: Treating RN: 1932-12-02 (87 y.o. [name] Primary Care Doye Montilla: [name] Other Clinician: Referring Ernan Runkles: Treating Shakur Lembo/Extender: [name] in Treatment: 0 Multidisciplinary Care Plan reviewed with physician Active Inactive Wound/Skin Impairment Nursing Diagnoses: Impaired tissue integrity Knowledge deficit related to ulceration/compromised skin integrity Goals: Patient/caregiver will verbalize understanding of skin care regimen Date Initiated: 01/28/2021 Target Resolution Date: 02/25/2021 Goal Status: Active Interventions: Assess patient/caregiver ability to obtain necessary supplies Assess patient/caregiver ability to perform ulcer/skin care regimen upon admission and as needed Assess ulceration(s) every visit Provide education on ulcer and skin care Notes: Electronic Signature(s) Signed: 01/28/2021 4:58:58 PM By: [name] Deed RN, BSN Entered By: [name] on 01/28/2021 14:01:36 -------------------------------------------------------------------------------- Pain Assessment Details Patient  Name: Date of Service: [name], [name] 01/28/2021 1:15 PM Medical Record Number: [id number] Patient Account Number: [id number] Date of Birth/Sex: Treating RN: 1932-05-22 (87 y.o. [name] Primary Care Jermari Tamargo: [name] Other Clinician: Referring Kendon Sedeno: Treating Duvall Comes/Extender: [name] in Treatment: 0 Active Problems Location of Pain Severity and Description of Pain Patient Has Paino Yes Site Locations Pain Location: Pain in Ulcers With Dressing Change: Yes Duration of  the Pain. Constant / Intermittento Constant Rate the pain. Current Pain Level: 3 Worst Pain Level: 9 Least Pain Level: 2 Character of Pain Describe the Pain: Aching, Throbbing Pain Management and Medication Current Pain Management: Medication: Yes Is the Current Pain Management Adequate: Inadequate Rest: Yes How does your wound impact your activities of daily livingo Sleep: Yes Bathing: No Appetite: No Relationship With Others: No Bladder Continence: No Emotions: Yes Bowel Continence: No Work: No Toileting: No Drive: No Dressing: No Hobbies: No Electronic Signature(s) Signed: 01/28/2021 4:58:58 PM By: [name] Deed RN, BSN Entered By: [name] on 01/28/2021 13:48:00 -------------------------------------------------------------------------------- Patient/Caregiver Education Details Patient Name: Date of Service: [name] 9/19/2022andnbsp1:15 PM Medical Record Number: [id number] Patient Account Number: [id number] Date of Birth/Gender: Treating RN: 1933/04/10 (87 y.o. [name] Primary Care Physician: [name] Other Clinician: Referring Physician: Treating Physician/Extender: [name] in Treatment: 0 Education Assessment Education Provided To: Patient Education Topics Provided [facility]: o Handouts: [facility] o Methods: Explain/Verbal, Printed Responses: Reinforcements needed, State content correctly Wound/Skin Impairment: Handouts: Caring for Your Ulcer, Skin Care Do's and Dont's Methods: Explain/Verbal, Printed Responses: Reinforcements needed, State content correctly Electronic Signature(s) Signed: 01/28/2021 4:58:58 PM By: [name] Deed RN, BSN Entered By: [name] on 01/28/2021 14:02:10 -------------------------------------------------------------------------------- Wound Assessment Details Patient Name: Date of Service: [name]. 01/28/2021  1:15 PM Medical Record Number: [id number] Patient Account Number: [id number] Date of Birth/Sex: Treating RN: [birth year] (87 y.o. [name] Primary Care Argentina Kosch: [name] Other Clinician: Referring Geniya Fulgham: Treating Deval Mroczka/Extender: [name] in Treatment: 0 Wound Status Wound Number: 1 Primary Etiology: Atypical Wound Location: Right, Lateral Achilles Wound Status: Open Wounding Event: Gradually Appeared Comorbid History: Cataracts, Hypertension, Osteoarthritis Date Acquired: 05/16/2020 Weeks Of Treatment: 0 Clustered Wound: No Photos Wound Measurements Length: (cm) 1.1 Width: (cm) 1.3 Depth: (cm) 0.1 Area: (cm) 1.123 Volume: (cm) 0.112 % Reduction in Area: 0% % Reduction in Volume: 0% Epithelialization: None Tunneling: No Undermining: No Wound Description Classification: Full Thickness Without Exposed Support Structures Wound Margin: Flat and Intact Exudate Amount: Medium Exudate Type: Serous Exudate Color: amber Foul Odor After Cleansing: No Slough/Fibrino Yes Wound Bed Granulation Amount: Small (1-33%) Exposed Structure Granulation Quality: Pink Fascia Exposed: No Necrotic Amount: Large (67-100%) Fat Layer (Subcutaneous Tissue) Exposed: Yes Necrotic Quality: Adherent Slough Tendon Exposed: No Muscle Exposed: No Joint Exposed: No Bone Exposed: No Electronic Signature(s) Signed: 01/28/2021 4:58:58 PM By: [name] Deed RN, BSN Entered By: [name] on 01/28/2021 13:48:57 -------------------------------------------------------------------------------- Vitals Details Patient Name: Date of Service: [name]. 01/28/2021 1:15 PM Medical Record Number: [id number] Patient Account Number: [id number] Date of Birth/Sex: Treating RN: [birth year] (87 y.o. [name] Primary Care Esai Stecklein: [name] Other Clinician: Referring Davionne Mastrangelo: Treating Tamkia Temples/Extender: [name] in  Treatment: 0 Vital Signs Time Taken: 13:19 Temperature (F): 98.2 Height (in): 60 Pulse (bpm): 81 Source: Stated Respiratory Rate (breaths/min): 18 Weight (lbs): 122 Blood Pressure (mmHg): 162/89 Source: Stated  Reference Range: 80 - 120 mg / dl Body Mass Index (BMI): 23.8 Electronic Signature(s) Signed: 01/28/2021 4:58:58 PM By: [name] Deed RN, BSN Entered By: [name] on 01/28/2021 13:20:07

## 2021-01-29 DIAGNOSIS — S91009A Unspecified open wound, unspecified ankle, initial encounter: Secondary | ICD-10-CM | POA: Diagnosis not present

## 2021-01-29 NOTE — Progress Notes (Addendum)
[name], [name] ([id number]) Visit Report for 01/28/2021 Chief Complaint Document Details Patient Name: Date of Service: [name] 01/28/2021 1:15 PM Medical Record Number: [id number] Patient Account Number: [id number] Date of Birth/Sex: Treating RN: [birth year] (87 y.o. [name] Primary Care Provider: Shirlean Mylar Other Clinician: Referring Provider: Treating Provider/Extender: Donato Heinz in Treatment: 0 Information Obtained from: Patient Chief Complaint Right foot wound Electronic Signature(s) Signed: 01/29/2021 4:35:55 PM By: Geralyn Corwin DO Entered By: Geralyn Corwin on 01/29/2021 16:29:09 -------------------------------------------------------------------------------- Debridement Details Patient Name: Date of Service: [name] 01/28/2021 1:15 PM Medical Record Number: [id number] Patient Account Number: [id number] Date of Birth/Sex: Treating RN: 04-Dec-1932 (87 y.o. Bayard Hugger, Bonita Quin Primary Care Provider: Shirlean Mylar Other Clinician: Referring Provider: Treating Provider/Extender: Donato Heinz in Treatment: 0 Debridement Performed for Assessment: Wound #1 Right,Lateral Achilles Performed By: Physician Geralyn Corwin, DO Debridement Type: Debridement Level of Consciousness (Pre-procedure): Awake and Alert Pre-procedure Verification/Time Out No Taken: Start Time: 14:22 Pain Control: Lidocaine 4% T opical Solution T Area Debrided (L x W): otal 1.1 (cm) x 1.3 (cm) = 1.43 (cm) Tissue and other material debrided: Non-Viable, Slough, Slough Level: Non-Viable Tissue Debridement Description: Selective/Open Wound Instrument: Curette, N/A Bleeding: None End Time: 14:24 Procedural Pain: 0 Post Procedural Pain: 0 Response to Treatment: Procedure was tolerated well Level of Consciousness (Post- Awake and Alert procedure): Post Debridement Measurements of Total Wound Length: (cm) 1.1 Width: (cm) 1.3 Depth:  (cm) 0.1 Volume: (cm) 0.112 Character of Wound/Ulcer Post Debridement: Improved Post Procedure Diagnosis Same as Pre-procedure Electronic Signature(s) Signed: 01/28/2021 4:58:58 PM By: [name] RN, BSN Signed: 01/29/2021 4:35:55 PM By: Geralyn Corwin DO Entered By: [name] on 01/28/2021 14:25:50 -------------------------------------------------------------------------------- HPI Details Patient Name: Date of Service: [name]. 01/28/2021 1:15 PM Medical Record Number: [id number] Patient Account Number: [id number] Date of Birth/Sex: Treating RN: Jan 23, 1933 (87 y.o. [name] Primary Care Provider: Shirlean Mylar Other Clinician: Referring Provider: Treating Provider/Extender: Donato Heinz in Treatment: 0 History of Present Illness HPI Description: Admission 01/28/2021 [name] is an 87 year old male with a past medical history of essential hypertension that presents to the clinic for a 50-month history of nonhealing wound to his right ankle. He states that he thinks the wound started From his shoes rubbing the back of his ankle. He switched shoes however the wound never healed. He is currently been keeping the area covered and applying antibiotic ointment. He denies signs of infection. Electronic Signature(s) Signed: 01/29/2021 4:35:55 PM By: Geralyn Corwin DO Entered By: Geralyn Corwin on 01/29/2021 16:32:07 -------------------------------------------------------------------------------- Physical Exam Details Patient Name: Date of Service: [name] 01/28/2021 1:15 PM Medical Record Number: [id number] Patient Account Number: [id number] Date of Birth/Sex: Treating RN: 1932-11-27 (87 y.o. [name] Primary Care Provider: Shirlean Mylar Other Clinician: Referring Provider: Treating Provider/Extender: Donato Heinz in Treatment: 0 Constitutional respirations regular, non-labored and within target  range for patient.. Cardiovascular 2+ dorsalis pedis/posterior tibialis pulses. Psychiatric pleasant and cooperative. Notes Right posterior ankle: Open wound with slough throughout. No surrounding evidence of infection. Electronic Signature(s) Signed: 01/29/2021 4:35:55 PM By: Geralyn Corwin DO Entered By: Geralyn Corwin on 01/29/2021 16:32:44 -------------------------------------------------------------------------------- Physician Orders Details Patient Name: Date of Service: [name]. 01/28/2021 1:15 PM Medical Record Number: [id number] Patient Account Number: [id number] Date of Birth/Sex: Treating RN: [birth year] (87 y.o. [name] Primary Care Provider: Shirlean Mylar Other Clinician: Referring Provider: Treating Provider/Extender:  Lorenza Cambridge Weeks in Treatment: 0 Verbal / Phone Orders: No Diagnosis Coding ICD-10 Coding Code Description S91.301A Unspecified open wound, right foot, initial encounter I10 Essential (primary) hypertension Follow-up Appointments ppointment in 1 week. - with Dr. Mikey Bussing Return A Bathing/ Shower/ Hygiene May shower and wash wound with soap and water. Wound Treatment Wound #1 - Achilles Wound Laterality: Right, Lateral Prim Dressing: Santyl Ointment 1 x Per Day/30 Days ary Discharge Instructions: Apply nickel thick amount to wound bed as instructed Secondary Dressing: Zetuvit Plus Silicone Border Dressing 4x4 (in/in) (DME) (Generic) 1 x Per Day/30 Days Discharge Instructions: Apply silicone border over primary dressing as directed. Patient Medications llergies: prednisone A Notifications Medication Indication Start End 01/28/2021 Santyl DOSE 1 - topical 250 unit/gram ointment - 1 application daily for 30 days Electronic Signature(s) Signed: 01/29/2021 4:35:55 PM By: Geralyn Corwin DO Previous Signature: 01/28/2021 3:14:08 PM Version By: Geralyn Corwin DO Entered By: Geralyn Corwin on 01/29/2021  16:33:09 -------------------------------------------------------------------------------- Problem List Details Patient Name: Date of Service: [name]. 01/28/2021 1:15 PM Medical Record Number: [id number] Patient Account Number: [id number] Date of Birth/Sex: Treating RN: 04-14-1933 (87 y.o. [name] Primary Care Provider: Shirlean Mylar Other Clinician: Referring Provider: Treating Provider/Extender: Donato Heinz in Treatment: 0 Active Problems ICD-10 Encounter Encounter Code Description Active Date MDM Diagnosis S91.301A Unspecified open wound, right foot, initial encounter 01/28/2021 No Yes I10 Essential (primary) hypertension 01/28/2021 No Yes Inactive Problems Resolved Problems Electronic Signature(s) Signed: 01/29/2021 4:35:55 PM By: Geralyn Corwin DO Entered By: Geralyn Corwin on 01/29/2021 16:28:40 -------------------------------------------------------------------------------- Progress Note Details Patient Name: Date of Service: [name] 01/28/2021 1:15 PM Medical Record Number: [id number] Patient Account Number: [id number] Date of Birth/Sex: Treating RN: 1933/05/01 (87 y.o. [name] Primary Care Provider: Shirlean Mylar Other Clinician: Referring Provider: Treating Provider/Extender: Donato Heinz in Treatment: 0 Subjective Chief Complaint Information obtained from Patient Right foot wound History of Present Illness (HPI) Admission 01/28/2021 Mr. Alrick Cubbage is an 87 year old male with a past medical history of essential hypertension that presents to the clinic for a 72-month history of nonhealing wound to his right ankle. He states that he thinks the wound started From his shoes rubbing the back of his ankle. He switched shoes however the wound never healed. He is currently been keeping the area covered and applying antibiotic ointment. He denies signs of infection. Patient History Information  obtained from Patient. Allergies prednisone (Reaction: hematuria) Family History Cancer - Siblings, Diabetes - Siblings, Kidney Disease - Siblings, No family history of Heart Disease, Hereditary Spherocytosis, Hypertension, Lung Disease, Seizures, Stroke, Thyroid Problems, Tuberculosis. Social History Former smoker - quit age 34, Marital Status - Married, Alcohol Use - Never, Drug Use - No History, Caffeine Use - Daily - coffee. Medical History Eyes Patient has history of Cataracts - bil removed Denies history of Glaucoma, Optic Neuritis Ear/Nose/Mouth/Throat Denies history of Chronic sinus problems/congestion, Middle ear problems Cardiovascular Patient has history of Hypertension Musculoskeletal Patient has history of Osteoarthritis Oncologic Denies history of Received Chemotherapy, Received Radiation Psychiatric Denies history of Anorexia/bulimia, Confinement Anxiety Hospitalization/Surgery History - bil rotator cuff surgeries. Medical A Surgical History Notes nd Ear/Nose/Mouth/Throat extremely hard of hearing Neurologic hx cervical fractures Review of Systems (ROS) Constitutional Symptoms (General Health) Denies complaints or symptoms of Fatigue, Fever, Chills, Marked Weight Change. Eyes Complains or has symptoms of Glasses / Contacts - reading. Denies complaints or symptoms of Dry Eyes, Vision Changes. Ear/Nose/Mouth/Throat Denies complaints or symptoms  of Chronic sinus problems or rhinitis. Respiratory Denies complaints or symptoms of Chronic or frequent coughs, Shortness of Breath. Cardiovascular Denies complaints or symptoms of Chest pain. Gastrointestinal Denies complaints or symptoms of Frequent diarrhea, Nausea, Vomiting. Endocrine Denies complaints or symptoms of Heat/cold intolerance. Genitourinary Denies complaints or symptoms of Frequent urination. Integumentary (Skin) Complains or has symptoms of Wounds - right heel. Musculoskeletal Complains or has  symptoms of Muscle Weakness. Neurologic Denies complaints or symptoms of Numbness/parasthesias. Psychiatric Denies complaints or symptoms of Claustrophobia, Suicidal. Objective Constitutional respirations regular, non-labored and within target range for patient.. Vitals Time Taken: 1:19 PM, Height: 60 in, Source: Stated, Weight: 122 lbs, Source: Stated, BMI: 23.8, Temperature: 98.2 F, Pulse: 81 bpm, Respiratory Rate: 18 breaths/min, Blood Pressure: 162/89 mmHg. Cardiovascular 2+ dorsalis pedis/posterior tibialis pulses. Psychiatric pleasant and cooperative. General Notes: Right posterior ankle: Open wound with slough throughout. No surrounding evidence of infection. Integumentary (Hair, Skin) Wound #1 status is Open. Original cause of wound was Gradually Appeared. The date acquired was: 05/16/2020. The wound is located on the Right,Lateral Achilles. The wound measures 1.1cm length x 1.3cm width x 0.1cm depth; 1.123cm^2 area and 0.112cm^3 volume. There is Fat Layer (Subcutaneous Tissue) exposed. There is no tunneling or undermining noted. There is a medium amount of serous drainage noted. The wound margin is flat and intact. There is small (1-33%) pink granulation within the wound bed. There is a large (67-100%) amount of necrotic tissue within the wound bed including Adherent Slough. Assessment Active Problems ICD-10 Unspecified open wound, right foot, initial encounter Essential (primary) hypertension Patient presents with a 79-month history of nonhealing wound. It is thought to be due to trauma. There is slough throughout and this is likely the reason it has not been able to heal. I debrided nonviable tissue however the debris is tightly adherent. I recommended Santyl. Follow-up in 1 week. Procedures Wound #1 Pre-procedure diagnosis of Wound #1 is an Atypical located on the Right,Lateral Achilles . There was a Selective/Open Wound Non-Viable Tissue Debridement with a total area of 1.43  sq cm performed by Geralyn Corwin, DO. With the following instrument(s): Curette to remove Non-Viable tissue/material. Material removed includes Slough after achieving pain control using Lidocaine 4% T opical Solution. No specimens were taken.There was no bleeding. The procedure was tolerated well with a pain level of 0 throughout and a pain level of 0 following the procedure. Post Debridement Measurements: 1.1cm length x 1.3cm width x 0.1cm depth; 0.112cm^3 volume. Character of Wound/Ulcer Post Debridement is improved. Post procedure Diagnosis Wound #1: Same as Pre-Procedure Plan Follow-up Appointments: Return Appointment in 1 week. - with Dr. Mikey Bussing Bathing/ Shower/ Hygiene: May shower and wash wound with soap and water. The following medication(s) was prescribed: Santyl topical 250 unit/gram ointment 1 1 application daily for 30 days starting 01/28/2021 WOUND #1: - Achilles Wound Laterality: Right, Lateral Prim Dressing: Santyl Ointment 1 x Per Day/30 Days ary Discharge Instructions: Apply nickel thick amount to wound bed as instructed Secondary Dressing: Zetuvit Plus Silicone Border Dressing 4x4 (in/in) (DME) (Generic) 1 x Per Day/30 Days Discharge Instructions: Apply silicone border over primary dressing as directed. 1. In office sharp debridement 2. Santyl 3. Follow-up in 1 week Electronic Signature(s) Signed: 01/29/2021 4:35:55 PM By: Geralyn Corwin DO Entered By: Geralyn Corwin on 01/29/2021 16:34:46 -------------------------------------------------------------------------------- HxROS Details Patient Name: Date of Service: [name]. 01/28/2021 1:15 PM Medical Record Number: [id number] Patient Account Number: [id number] Date of Birth/Sex: Treating RN: [birth year] (87 y.o. [name] Primary  Care Provider: Shirlean Mylar Other Clinician: Referring Provider: Treating Provider/Extender: Donato Heinz in Treatment: 0 Information Obtained  From Patient Constitutional Symptoms (General Health) Complaints and Symptoms: Negative for: Fatigue; Fever; Chills; Marked Weight Change Eyes Complaints and Symptoms: Positive for: Glasses / Contacts - reading Negative for: Dry Eyes; Vision Changes Medical History: Positive for: Cataracts - bil removed Negative for: Glaucoma; Optic Neuritis Ear/Nose/Mouth/Throat Complaints and Symptoms: Negative for: Chronic sinus problems or rhinitis Medical History: Negative for: Chronic sinus problems/congestion; Middle ear problems Past Medical History Notes: extremely hard of hearing Respiratory Complaints and Symptoms: Negative for: Chronic or frequent coughs; Shortness of Breath Cardiovascular Complaints and Symptoms: Negative for: Chest pain Medical History: Positive for: Hypertension Gastrointestinal Complaints and Symptoms: Negative for: Frequent diarrhea; Nausea; Vomiting Endocrine Complaints and Symptoms: Negative for: Heat/cold intolerance Genitourinary Complaints and Symptoms: Negative for: Frequent urination Integumentary (Skin) Complaints and Symptoms: Positive for: Wounds - right heel Musculoskeletal Complaints and Symptoms: Positive for: Muscle Weakness Medical History: Positive for: Osteoarthritis Neurologic Complaints and Symptoms: Negative for: Numbness/parasthesias Medical History: Past Medical History Notes: hx cervical fractures Psychiatric Complaints and Symptoms: Negative for: Claustrophobia; Suicidal Medical History: Negative for: Anorexia/bulimia; Confinement Anxiety Hematologic/Lymphatic Immunological Oncologic Medical History: Negative for: Received Chemotherapy; Received Radiation HBO Extended History Items Eyes: Cataracts Immunizations Pneumococcal Vaccine: Received Pneumococcal Vaccination: Yes Received Pneumococcal Vaccination On or After 60th Birthday: Yes Implantable Devices None Hospitalization / Surgery History Type of  Hospitalization/Surgery bil rotator cuff surgeries Family and Social History Cancer: Yes - Siblings; Diabetes: Yes - Siblings; Heart Disease: No; Hereditary Spherocytosis: No; Hypertension: No; Kidney Disease: Yes - Siblings; Lung Disease: No; Seizures: No; Stroke: No; Thyroid Problems: No; Tuberculosis: No; Former smoker - quit age 49; Marital Status - Married; Alcohol Use: Never; Drug Use: No History; Caffeine Use: Daily - coffee; Financial Concerns: No; Food, Clothing or Shelter Needs: No; Support System Lacking: No; Transportation Concerns: No Electronic Signature(s) Signed: 01/28/2021 4:58:58 PM By: [name] RN, BSN Signed: 01/29/2021 4:35:55 PM By: Geralyn Corwin DO Entered By: [name] on 01/28/2021 13:32:54 -------------------------------------------------------------------------------- SuperBill Details Patient Name: Date of Service: [name] 01/28/2021 Medical Record Number: [id number] Patient Account Number: [id number] Date of Birth/Sex: Treating RN: 12-24-1932 (87 y.o. [name] Primary Care Provider: Shirlean Mylar Other Clinician: Referring Provider: Treating Provider/Extender: Donato Heinz in Treatment: 0 Diagnosis Coding ICD-10 Codes Code Description S91.301A Unspecified open wound, right foot, initial encounter I10 Essential (primary) hypertension Facility Procedures CPT4 Code: [id number] Description: 99213 - WOUND CARE VISIT-LEV 3 EST PT Modifier: 25 Quantity: 1 CPT4 Code: [id number] Description: 97597 - DEBRIDE WOUND 1ST 20 SQ CM OR < ICD-10 Diagnosis Description S91.301A Unspecified open wound, right foot, initial encounter I10 Essential (primary) hypertension Modifier: Quantity: 1 Physician Procedures : CPT4 Code Description Modifier [id number] WC PHYS LEVEL 3 NEW PT ICD-10 Diagnosis Description S91.301A Unspecified open wound, right foot, initial encounter I10 Essential (primary) hypertension Quantity: 1 : [id number]  97597 - WC PHYS DEBR WO ANESTH 20 SQ CM ICD-10 Diagnosis Description S91.301A Unspecified open wound, right foot, initial encounter I10 Essential (primary) hypertension Quantity: 1 Electronic Signature(s) Signed: 01/30/2021 5:43:52 PM By: [name] RN, BSN Signed: 01/31/2021 9:08:06 AM By: Geralyn Corwin DO Previous Signature: 01/29/2021 4:35:55 PM Version By: Geralyn Corwin DO Entered By: [name] on 01/30/2021 16:41:53

## 2021-02-05 ENCOUNTER — Encounter (HOSPITAL_BASED_OUTPATIENT_CLINIC_OR_DEPARTMENT_OTHER): Payer: Medicare Other | Admitting: Internal Medicine

## 2021-02-05 ENCOUNTER — Other Ambulatory Visit: Payer: Self-pay

## 2021-02-05 DIAGNOSIS — M1990 Unspecified osteoarthritis, unspecified site: Secondary | ICD-10-CM | POA: Diagnosis not present

## 2021-02-05 DIAGNOSIS — Z833 Family history of diabetes mellitus: Secondary | ICD-10-CM | POA: Diagnosis not present

## 2021-02-05 DIAGNOSIS — S91301A Unspecified open wound, right foot, initial encounter: Secondary | ICD-10-CM | POA: Diagnosis not present

## 2021-02-06 NOTE — Progress Notes (Signed)
[name], [name] ([id number]) Visit Report for 02/05/2021 Arrival Information Details Patient Name: Date of Service: [name], [name] 02/05/2021 11:00 A M Medical Record Number: [id number] Patient Account Number: [id number] Date of Birth/Sex: Treating RN: 23-May-1932 (87 y.[name]. Tammy Sours Primary Care Terrian Ridlon: Shirlean Mylar Other Clinician: Referring Marketia Stallsmith: Treating Aviona Martenson/Extender: Donato Heinz in Treatment: 1 Visit Information History Since Last Visit Added or deleted any medications: No Patient Arrived: Ambulatory Any new allergies or adverse reactions: No Arrival Time: 11:27 Had a fall or experienced change in No Accompanied By: wife activities of daily living that may affect Transfer Assistance: None risk of falls: Patient Identification Verified: Yes Signs or symptoms of abuse/neglect since last visito No Secondary Verification Process Completed: Yes Hospitalized since last visit: No Patient Requires Transmission-Based Precautions: No Implantable device outside of the clinic excluding No Patient Has Alerts: Yes cellular tissue based products placed in the center Patient Alerts: R ABI=1.12, L ABI=1.13 since last visit: Has Dressing in Place as Prescribed: Yes Pain Present Now: No Electronic Signature(s) Signed: 02/05/2021 12:59:59 PM By: Karl Ito Entered By: Karl Ito on 02/05/2021 11:28:00 -------------------------------------------------------------------------------- Encounter Discharge Information Details Patient Name: Date of Service: [name] Chimera. 02/05/2021 11:00 A M Medical Record Number: [id number] Patient Account Number: [id number] Date of Birth/Sex: Treating RN: [birth year] (65 y.[name]. [name] Primary Care Aaleigha Bozza: Shirlean Mylar Other Clinician: Referring Aianna Fahs: Treating Shacora Zynda/Extender: Donato Heinz in Treatment: 1 Encounter Discharge Information Items Post Procedure  Vitals Discharge Condition: Stable Temperature (F): 98.3 Ambulatory Status: Ambulatory Pulse (bpm): 84 Discharge Destination: Home Respiratory Rate (breaths/min): 18 Transportation: Private Auto Blood Pressure (mmHg): 161/92 Accompanied By: spouse Schedule Follow-up Appointment: Yes Clinical Summary of Care: Patient Declined Electronic Signature(s) Signed: 02/06/2021 5:46:28 PM By: [name] RN, BSN Entered By: [name] on 02/05/2021 12:09:35 -------------------------------------------------------------------------------- Lower Extremity Assessment Details Patient Name: Date of Service: [name] Chimera. 02/05/2021 11:00 A M Medical Record Number: [id number] Patient Account Number: [id number] Date of Birth/Sex: Treating RN: [birth year] (72 y.[name]. Tammy Sours Primary Care Paulo Keimig: Shirlean Mylar Other Clinician: Referring Jazzmyn Filion: Treating Caera Enwright/Extender: Donato Heinz in Treatment: 1 Edema Assessment Assessed: [Left: No] [Right: Yes] Edema: [Left: N] [Right: [name]] Calf Left: Right: Point of Measurement: From Medial Instep 29 cm Ankle Left: Right: Point of Measurement: From Medial Instep 18 cm Vascular Assessment Pulses: Dorsalis Pedis Palpable: [Right:Yes] Electronic Signature(s) Signed: 02/05/2021 5:46:45 PM By: Shawn Stall RN, BSN Entered By: Shawn Stall on 02/05/2021 11:36:59 -------------------------------------------------------------------------------- Multi Wound Chart Details Patient Name: Date of Service: [name] Chimera. 02/05/2021 11:00 A M Medical Record Number: [id number] Patient Account Number: [id number] Date of Birth/Sex: Treating RN: [birth year] (46 y.[name]. Tammy Sours Primary Care Fallou Hulbert: Shirlean Mylar Other Clinician: Referring Laticia Vannostrand: Treating Dean Goldner/Extender: Donato Heinz in Treatment: 1 Vital Signs Height(in): 60 Pulse(bpm): 84 Weight(lbs): 122 Blood Pressure(mmHg):  161/92 Body Mass Index(BMI): 24 Temperature(F): 98.3 Respiratory Rate(breaths/min): 18 Photos: [1:Right, Lateral Achilles] [N/A:N/A N/A] Wound Location: [1:Gradually Appeared] [N/A:N/A] Wounding Event: [1:Atypical] [N/A:N/A] Primary Etiology: [1:Cataracts, Hypertension,] [N/A:N/A] Comorbid History: [1:Osteoarthritis 05/16/2020] [N/A:N/A] Date Acquired: [1:1] [N/A:N/A] Weeks of Treatment: [1:Open] [N/A:N/A] Wound Status: [1:0.7x0.3x0.1] [N/A:N/A] Measurements L x W x D (cm) [1:0.165] [N/A:N/A] A (cm) : rea [1:0.016] [N/A:N/A] Volume (cm) : [1:85.30%] [N/A:N/A] % Reduction in A [1:rea: 85.70%] [N/A:N/A] % Reduction in Volume: [1:Full Thickness Without Exposed] [N/A:N/A] Classification: [1:Support Structures Medium] [N/A:N/A] Exudate A mount: [1:Serous] [N/A:N/A] Exudate Type: [1:amber] [N/A:N/A] Exudate Color: [1:Flat and  Intact] [N/A:N/A] Wound Margin: [1:None Present (0%)] [N/A:N/A] Granulation A mount: [1:Large (67-100%)] [N/A:N/A] Necrotic A mount: [1:Fat Layer (Subcutaneous Tissue): Yes N/A] Exposed Structures: [1:Fascia: No Tendon: No Muscle: No Joint: No Bone: No Small (1-33%)] [N/A:N/A] Epithelialization: [1:Debridement - Excisional] [N/A:N/A] Debridement: Pre-procedure Verification/Time Out 11:55 [N/A:N/A] Taken: [1:Lidocaine 4% Topical Solution] [N/A:N/A] Pain Control: [1:Subcutaneous, Slough] [N/A:N/A] Tissue Debrided: [1:Skin/Subcutaneous Tissue] [N/A:N/A] Level: [1:0.21] [N/A:N/A] Debridement A (sq cm): [1:rea Curette] [N/A:N/A] Instrument: [1:Minimum] [N/A:N/A] Bleeding: [1:Pressure] [N/A:N/A] Hemostasis A chieved: [1:3] [N/A:N/A] Procedural Pain: [1:2] [N/A:N/A] Post Procedural Pain: [1:Procedure was tolerated well] [N/A:N/A] Debridement Treatment Response: [1:0.7x0.3x0.1] [N/A:N/A] Post Debridement Measurements L x W x D (cm) [1:0.016] [N/A:N/A] Post Debridement Volume: (cm) [1:Debridement] [N/A:N/A] Treatment Notes Wound #1 (Achilles) Wound Laterality:  Right, Lateral Cleanser Peri-Wound Care Topical Primary Dressing Santyl Ointment Discharge Instruction: Apply nickel thick amount to wound bed as instructed Secondary Dressing Zetuvit Plus Silicone Border Dressing 4x4 (in/in) Discharge Instruction: Apply silicone border over primary dressing as directed. Secured With Compression Wrap Compression Stockings Facilities manager) Signed: 02/05/2021 1:03:17 PM By: Geralyn Corwin DO Signed: 02/05/2021 5:46:45 PM By: Shawn Stall RN, BSN Entered By: Geralyn Corwin on 02/05/2021 12:59:28 -------------------------------------------------------------------------------- Multi-Disciplinary Care Plan Details Patient Name: Date of Service: [name] Chimera. 02/05/2021 11:00 A M Medical Record Number: [id number] Patient Account Number: [id number] Date of Birth/Sex: Treating RN: 09-17-1932 (62 y.[name]. Tammy Sours Primary Care Jylan Loeza: Shirlean Mylar Other Clinician: Referring Aayden Cefalu: Treating Gurfateh Mcclain/Extender: Donato Heinz in Treatment: 1 Multidisciplinary Care Plan reviewed with physician Active Inactive Wound/Skin Impairment Nursing Diagnoses: Impaired tissue integrity Knowledge deficit related to ulceration/compromised skin integrity Goals: Patient/caregiver will verbalize understanding of skin care regimen Date Initiated: 01/28/2021 Target Resolution Date: 02/25/2021 Goal Status: Active Interventions: Assess patient/caregiver ability to obtain necessary supplies Assess patient/caregiver ability to perform ulcer/skin care regimen upon admission and as needed Assess ulceration(s) every visit Provide education on ulcer and skin care Notes: Electronic Signature(s) Signed: 02/05/2021 5:46:45 PM By: Shawn Stall RN, BSN Entered By: Shawn Stall on 02/05/2021 11:40:08 -------------------------------------------------------------------------------- Pain Assessment Details Patient Name: Date of  Service: [name] Chimera. 02/05/2021 11:00 A M Medical Record Number: [id number] Patient Account Number: [id number] Date of Birth/Sex: Treating RN: 1932-08-18 (61 y.[name]. Tammy Sours Primary Care Obbie Lewallen: Shirlean Mylar Other Clinician: Referring Davanta Meuser: Treating Jeffrie Lofstrom/Extender: Donato Heinz in Treatment: 1 Active Problems Location of Pain Severity and Description of Pain Patient Has Paino No Site Locations Pain Management and Medication Current Pain Management: Electronic Signature(s) Signed: 02/05/2021 12:59:59 PM By: Karl Ito Signed: 02/05/2021 5:46:45 PM By: Shawn Stall RN, BSN Entered By: Karl Ito on 02/05/2021 11:28:30 -------------------------------------------------------------------------------- Patient/Caregiver Education Details Patient Name: Date of Service: [name], [name] LIN J. 9/27/2022andnbsp11:00 A M Medical Record Number: [id number] Patient Account Number: [id number] Date of Birth/Gender: Treating RN: 01-05-1933 (85 y.[name]. Tammy Sours Primary Care Physician: Shirlean Mylar Other Clinician: Referring Physician: Treating Physician/Extender: Donato Heinz in Treatment: 1 Education Assessment Education Provided To: Patient Education Topics Provided Wound/Skin Impairment: Handouts: Skin Care Do's and Dont's Methods: Explain/Verbal Responses: Reinforcements needed Electronic Signature(s) Signed: 02/05/2021 5:46:45 PM By: Shawn Stall RN, BSN Entered By: Shawn Stall on 02/05/2021 11:40:24 -------------------------------------------------------------------------------- Wound Assessment Details Patient Name: Date of Service: [name] Chimera. 02/05/2021 11:00 A M Medical Record Number: [id number] Patient Account Number: [id number] Date of Birth/Sex: Treating RN: 07-13-1932 (53 y.[name]. Tammy Sours Primary Care Bryttani Blew: Shirlean Mylar Other Clinician: Referring Cheralyn Oliver: Treating Mayo Owczarzak/Extender:  Donato Heinz  in Treatment: 1 Wound Status Wound Number: 1 Primary Etiology: Atypical Wound Location: Right, Lateral Achilles Wound Status: Open Wounding Event: Gradually Appeared Comorbid History: Cataracts, Hypertension, Osteoarthritis Date Acquired: 05/16/2020 Weeks Of Treatment: 1 Clustered Wound: No Photos Wound Measurements Length: (cm) 0.7 Width: (cm) 0.3 Depth: (cm) 0.1 Area: (cm) 0.165 Volume: (cm) 0.016 % Reduction in Area: 85.3% % Reduction in Volume: 85.7% Epithelialization: Small (1-33%) Tunneling: No Undermining: No Wound Description Classification: Full Thickness Without Exposed Support Structures Wound Margin: Flat and Intact Exudate Amount: Medium Exudate Type: Serous Exudate Color: amber Foul Odor After Cleansing: No Slough/Fibrino Yes Wound Bed Granulation Amount: None Present (0%) Exposed Structure Necrotic Amount: Large (67-100%) Fascia Exposed: No Necrotic Quality: Adherent Slough Fat Layer (Subcutaneous Tissue) Exposed: Yes Tendon Exposed: No Muscle Exposed: No Joint Exposed: No Bone Exposed: No Treatment Notes Wound #1 (Achilles) Wound Laterality: Right, Lateral Cleanser Peri-Wound Care Topical Primary Dressing Santyl Ointment Discharge Instruction: Apply nickel thick amount to wound bed as instructed Secondary Dressing Zetuvit Plus Silicone Border Dressing 4x4 (in/in) Discharge Instruction: Apply silicone border over primary dressing as directed. Secured With Compression Wrap Compression Stockings Facilities manager) Signed: 02/05/2021 5:46:45 PM By: Shawn Stall RN, BSN Entered By: Shawn Stall on 02/05/2021 11:38:47 -------------------------------------------------------------------------------- Vitals Details Patient Name: Date of Service: [name] Chimera. 02/05/2021 11:00 A M Medical Record Number: [id number] Patient Account Number: [id number] Date of Birth/Sex: Treating RN: 1932/09/12 (21  y.[name]. Harlon Flor, Yvonne Kendall Primary Care Berneice Zettlemoyer: Shirlean Mylar Other Clinician: Referring Thekla Colborn: Treating Dantavious Snowball/Extender: Donato Heinz in Treatment: 1 Vital Signs Time Taken: 11:28 Temperature (F): 98.3 Height (in): 60 Pulse (bpm): 84 Weight (lbs): 122 Respiratory Rate (breaths/min): 18 Body Mass Index (BMI): 23.8 Blood Pressure (mmHg): 161/92 Reference Range: 80 - 120 mg / dl Electronic Signature(s) Signed: 02/05/2021 12:59:59 PM By: Karl Ito Entered By: Karl Ito on 02/05/2021 11:28:25

## 2021-02-06 NOTE — Progress Notes (Signed)
JABIN, TAPP ([id number]) Visit Report for 02/05/2021 Chief Complaint Document Details Patient Name: Date of Service: [name], [name] 02/05/2021 11:00 A M Medical Record Number: [id number] Patient Account Number: [id number] Date of Birth/Sex: Treating RN: [birth year] (87 y.o. Tammy Sours Primary Care Provider: Shirlean Mylar Other Clinician: Referring Provider: Treating Provider/Extender: Donato Heinz in Treatment: 1 Information Obtained from: Patient Chief Complaint Right foot wound Electronic Signature(s) Signed: 02/05/2021 1:03:17 PM By: Geralyn Corwin DO Entered By: Geralyn Corwin on 02/05/2021 13:00:01 -------------------------------------------------------------------------------- Debridement Details Patient Name: Date of Service: [name]. 02/05/2021 11:00 A M Medical Record Number: [id number] Patient Account Number: [id number] Date of Birth/Sex: Treating RN: Nov 11, 1932 (87 y.o. [name] Primary Care Provider: Shirlean Mylar Other Clinician: Referring Provider: Treating Provider/Extender: Donato Heinz in Treatment: 1 Debridement Performed for Assessment: Wound #1 Right,Lateral Achilles Performed By: Physician Geralyn Corwin, DO Debridement Type: Debridement Level of Consciousness (Pre-procedure): Awake and Alert Pre-procedure Verification/Time Out Yes - 11:55 Taken: Start Time: 12:00 Pain Control: Lidocaine 4% T opical Solution T Area Debrided (L x W): otal 0.7 (cm) x 0.3 (cm) = 0.21 (cm) Tissue and other material debrided: Viable, Non-Viable, Slough, Subcutaneous, Slough Level: Skin/Subcutaneous Tissue Debridement Description: Excisional Instrument: Curette Bleeding: Minimum Hemostasis Achieved: Pressure End Time: 12:03 Procedural Pain: 3 Post Procedural Pain: 2 Response to Treatment: Procedure was tolerated well Level of Consciousness (Post- Awake and Alert procedure): Post Debridement  Measurements of Total Wound Length: (cm) 0.7 Width: (cm) 0.3 Depth: (cm) 0.1 Volume: (cm) 0.016 Character of Wound/Ulcer Post Debridement: Improved Post Procedure Diagnosis Same as Pre-procedure Electronic Signature(s) Signed: 02/05/2021 1:03:17 PM By: Geralyn Corwin DO Signed: 02/06/2021 5:46:28 PM By: [name] RN, BSN Entered By: [name] on 02/05/2021 12:02:55 -------------------------------------------------------------------------------- HPI Details Patient Name: Date of Service: [name]. 02/05/2021 11:00 A M Medical Record Number: [id number] Patient Account Number: [id number] Date of Birth/Sex: Treating RN: 05/03/1933 (87 y.o. Tammy Sours Primary Care Provider: Shirlean Mylar Other Clinician: Referring Provider: Treating Provider/Extender: Donato Heinz in Treatment: 1 History of Present Illness HPI Description: Admission 01/28/2021 [name] is an 87 year old male with a past medical history of essential hypertension that presents to the clinic for a 15-month history of nonhealing wound to his right ankle. He states that he thinks the wound started From his shoes rubbing the back of his ankle. He switched shoes however the wound never healed. He is currently been keeping the area covered and applying antibiotic ointment. He denies signs of infection. 9/27; patient presents for follow-up. He has been using Santyl on the wound bed daily. He has no issues or complaints today. He denies signs of infection. Electronic Signature(s) Signed: 02/05/2021 1:03:17 PM By: Geralyn Corwin DO Entered By: Geralyn Corwin on 02/05/2021 13:00:31 -------------------------------------------------------------------------------- Physical Exam Details Patient Name: Date of Service: [name], [name] 02/05/2021 11:00 A M Medical Record Number: [id number] Patient Account Number: [id number] Date of Birth/Sex: Treating RN: June 24, 1932 (87 y.o. Tammy Sours Primary Care Provider: Shirlean Mylar Other Clinician: Referring Provider: Treating Provider/Extender: Donato Heinz in Treatment: 1 Constitutional respirations regular, non-labored and within target range for patient.. Cardiovascular 2+ dorsalis pedis/posterior tibialis pulses. Psychiatric pleasant and cooperative. Notes Right posterior ankle: Open wound with slough throughout. Post debridement there is some granulation tissue present. No surrounding evidence of infection. Electronic Signature(s) Signed: 02/05/2021 1:03:17 PM By: Geralyn Corwin DO Entered By: Geralyn Corwin on 02/05/2021 13:01:37 -------------------------------------------------------------------------------- Physician  Orders Details Patient Name: Date of Service: [name], [name] 02/05/2021 11:00 A M Medical Record Number: [id number] Patient Account Number: [id number] Date of Birth/Sex: Treating RN: 1932-08-16 (87 y.o. [name] Primary Care Provider: Shirlean Mylar Other Clinician: Referring Provider: Treating Provider/Extender: Donato Heinz in Treatment: 1 Verbal / Phone Orders: No Diagnosis Coding ICD-10 Coding Code Description I10 Essential (primary) hypertension S91.301D Unspecified open wound, right foot, subsequent encounter Follow-up Appointments ppointment in 1 week. - with Dr. Mikey Bussing Return A Bathing/ Shower/ Hygiene May shower and wash wound with soap and water. Wound Treatment Wound #1 - Achilles Wound Laterality: Right, Lateral Prim Dressing: Santyl Ointment 1 x Per Day/30 Days ary Discharge Instructions: Apply nickel thick amount to wound bed as instructed Secondary Dressing: Zetuvit Plus Silicone Border Dressing 4x4 (in/in) (Generic) 1 x Per Day/30 Days Discharge Instructions: Apply silicone border over primary dressing as directed. Electronic Signature(s) Signed: 02/05/2021 1:03:17 PM By: Geralyn Corwin DO Entered By: Geralyn Corwin on 02/05/2021 13:01:53 -------------------------------------------------------------------------------- Problem List Details Patient Name: Date of Service: [name]. 02/05/2021 11:00 A M Medical Record Number: [id number] Patient Account Number: [id number] Date of Birth/Sex: Treating RN: December 03, 1932 (87 y.o. Tammy Sours Primary Care Provider: Shirlean Mylar Other Clinician: Referring Provider: Treating Provider/Extender: Donato Heinz in Treatment: 1 Active Problems ICD-10 Encounter Code Description Active Date MDM Diagnosis I10 Essential (primary) hypertension 01/28/2021 No Yes S91.301D Unspecified open wound, right foot, subsequent encounter 02/05/2021 No Yes Inactive Problems ICD-10 Code Description Active Date Inactive Date S91.301A Unspecified open wound, right foot, initial encounter 01/28/2021 01/29/2021 Resolved Problems Electronic Signature(s) Signed: 02/05/2021 1:03:17 PM By: Geralyn Corwin DO Entered By: Geralyn Corwin on 02/05/2021 12:59:20 -------------------------------------------------------------------------------- Progress Note Details Patient Name: Date of Service: [name]. 02/05/2021 11:00 A M Medical Record Number: [id number] Patient Account Number: [id number] Date of Birth/Sex: Treating RN: [birth year] (87 y.o. Tammy Sours Primary Care Provider: Shirlean Mylar Other Clinician: Referring Provider: Treating Provider/Extender: Donato Heinz in Treatment: 1 Subjective Chief Complaint Information obtained from Patient Right foot wound History of Present Illness (HPI) Admission 01/28/2021 [name] is an 87 year old male with a past medical history of essential hypertension that presents to the clinic for a 3-month history of nonhealing wound to his right ankle. He states that he thinks the wound started From his shoes rubbing the back of his ankle. He switched shoes however the  wound never healed. He is currently been keeping the area covered and applying antibiotic ointment. He denies signs of infection. 9/27; patient presents for follow-up. He has been using Santyl on the wound bed daily. He has no issues or complaints today. He denies signs of infection. Patient History Information obtained from Patient. Family History Cancer - Siblings, Diabetes - Siblings, Kidney Disease - Siblings, No family history of Heart Disease, Hereditary Spherocytosis, Hypertension, Lung Disease, Seizures, Stroke, Thyroid Problems, Tuberculosis. Social History Former smoker - quit age 58, Marital Status - Married, Alcohol Use - Never, Drug Use - No History, Caffeine Use - Daily - coffee. Medical History Eyes Patient has history of Cataracts - bil removed Denies history of Glaucoma, Optic Neuritis Ear/Nose/Mouth/Throat Denies history of Chronic sinus problems/congestion, Middle ear problems Cardiovascular Patient has history of Hypertension Musculoskeletal Patient has history of Osteoarthritis Oncologic Denies history of Received Chemotherapy, Received Radiation Psychiatric Denies history of Anorexia/bulimia, Confinement Anxiety Hospitalization/Surgery History - bil rotator cuff surgeries. Medical A Surgical History Notes nd Ear/Nose/Mouth/Throat extremely hard of  hearing Neurologic hx cervical fractures Objective Constitutional respirations regular, non-labored and within target range for patient.. Vitals Time Taken: 11:28 AM, Height: 60 in, Weight: 122 lbs, BMI: 23.8, Temperature: 98.3 F, Pulse: 84 bpm, Respiratory Rate: 18 breaths/min, Blood Pressure: 161/92 mmHg. Cardiovascular 2+ dorsalis pedis/posterior tibialis pulses. Psychiatric pleasant and cooperative. General Notes: Right posterior ankle: Open wound with slough throughout. Post debridement there is some granulation tissue present. No surrounding evidence of infection. Integumentary (Hair, Skin) Wound #1  status is Open. Original cause of wound was Gradually Appeared. The date acquired was: 05/16/2020. The wound has been in treatment 1 weeks. The wound is located on the Right,Lateral Achilles. The wound measures 0.7cm length x 0.3cm width x 0.1cm depth; 0.165cm^2 area and 0.016cm^3 volume. There is Fat Layer (Subcutaneous Tissue) exposed. There is no tunneling or undermining noted. There is a medium amount of serous drainage noted. The wound margin is flat and intact. There is no granulation within the wound bed. There is a large (67-100%) amount of necrotic tissue within the wound bed including Adherent Slough. Assessment Active Problems ICD-10 Essential (primary) hypertension Unspecified open wound, right foot, subsequent encounter Patient's wound has shown improvement in size since last clinic visit. I debrided nonviable tissue and there is some granulation tissue present. There is still tightly adherent slough present. I recommended continuing Santyl daily to the wound bed. No signs of infection on exam. Follow-up in 1 week Procedures Wound #1 Pre-procedure diagnosis of Wound #1 is an Atypical located on the Right,Lateral Achilles . There was a Excisional Skin/Subcutaneous Tissue Debridement with a total area of 0.21 sq cm performed by Geralyn Corwin, DO. With the following instrument(s): Curette to remove Viable and Non-Viable tissue/material. Material removed includes Subcutaneous Tissue and Slough and after achieving pain control using Lidocaine 4% T opical Solution. No specimens were taken. A time out was conducted at 11:55, prior to the start of the procedure. A Minimum amount of bleeding was controlled with Pressure. The procedure was tolerated well with a pain level of 3 throughout and a pain level of 2 following the procedure. Post Debridement Measurements: 0.7cm length x 0.3cm width x 0.1cm depth; 0.016cm^3 volume. Character of Wound/Ulcer Post Debridement is improved. Post procedure  Diagnosis Wound #1: Same as Pre-Procedure Plan Follow-up Appointments: Return Appointment in 1 week. - with Dr. Mikey Bussing Bathing/ Shower/ Hygiene: May shower and wash wound with soap and water. WOUND #1: - Achilles Wound Laterality: Right, Lateral Prim Dressing: Santyl Ointment 1 x Per Day/30 Days ary Discharge Instructions: Apply nickel thick amount to wound bed as instructed Secondary Dressing: Zetuvit Plus Silicone Border Dressing 4x4 (in/in) (Generic) 1 x Per Day/30 Days Discharge Instructions: Apply silicone border over primary dressing as directed. 1. Santyl daily 2. Foam border dressing 3. Follow-up in 1 week Electronic Signature(s) Signed: 02/05/2021 1:03:17 PM By: Geralyn Corwin DO Entered By: Geralyn Corwin on 02/05/2021 13:02:44 -------------------------------------------------------------------------------- HxROS Details Patient Name: Date of Service: [name]. 02/05/2021 11:00 A M Medical Record Number: [id number] Patient Account Number: [id number] Date of Birth/Sex: Treating RN: Jun 04, 1932 (87 y.o. Tammy Sours Primary Care Provider: Shirlean Mylar Other Clinician: Referring Provider: Treating Provider/Extender: Donato Heinz in Treatment: 1 Information Obtained From Patient Eyes Medical History: Positive for: Cataracts - bil removed Negative for: Glaucoma; Optic Neuritis Ear/Nose/Mouth/Throat Medical History: Negative for: Chronic sinus problems/congestion; Middle ear problems Past Medical History Notes: extremely hard of hearing Cardiovascular Medical History: Positive for: Hypertension Musculoskeletal Medical History: Positive for: Osteoarthritis Neurologic Medical  History: Past Medical History Notes: hx cervical fractures Oncologic Medical History: Negative for: Received Chemotherapy; Received Radiation Psychiatric Medical History: Negative for: Anorexia/bulimia; Confinement Anxiety HBO Extended History  Items Eyes: Cataracts Immunizations Pneumococcal Vaccine: Received Pneumococcal Vaccination: Yes Received Pneumococcal Vaccination On or After 60th Birthday: Yes Implantable Devices None Hospitalization / Surgery History Type of Hospitalization/Surgery bil rotator cuff surgeries Family and Social History Cancer: Yes - Siblings; Diabetes: Yes - Siblings; Heart Disease: No; Hereditary Spherocytosis: No; Hypertension: No; Kidney Disease: Yes - Siblings; Lung Disease: No; Seizures: No; Stroke: No; Thyroid Problems: No; Tuberculosis: No; Former smoker - quit age 89; Marital Status - Married; Alcohol Use: Never; Drug Use: No History; Caffeine Use: Daily - coffee; Financial Concerns: No; Food, Clothing or Shelter Needs: No; Support System Lacking: No; Transportation Concerns: No Electronic Signature(s) Signed: 02/05/2021 1:03:17 PM By: Geralyn Corwin DO Signed: 02/05/2021 5:46:45 PM By: Shawn Stall RN, BSN Entered By: Geralyn Corwin on 02/05/2021 13:00:47 -------------------------------------------------------------------------------- SuperBill Details Patient Name: Date of Service: [name] 02/05/2021 Medical Record Number: [id number] Patient Account Number: [id number] Date of Birth/Sex: Treating RN: [birth year] (87 y.o. [name] Primary Care Provider: Shirlean Mylar Other Clinician: Referring Provider: Treating Provider/Extender: Donato Heinz in Treatment: 1 Diagnosis Coding ICD-10 Codes Code Description S91.301A Unspecified open wound, right foot, initial encounter I10 Essential (primary) hypertension Facility Procedures CPT4 Code: [id number] Description: 11042 - DEB SUBQ TISSUE 20 SQ CM/< ICD-10 Diagnosis Description S91.301A Unspecified open wound, right foot, initial encounter Modifier: Quantity: 1 Physician Procedures : CPT4 Code Description Modifier [id number] 11042 - WC PHYS SUBQ TISS 20 SQ CM ICD-10 Diagnosis Description S91.301A  Unspecified open wound, right foot, initial encounter Quantity: 1 Electronic Signature(s) Signed: 02/05/2021 1:03:17 PM By: Geralyn Corwin DO Entered By: Geralyn Corwin on 02/05/2021 13:02:50

## 2021-02-11 DIAGNOSIS — G629 Polyneuropathy, unspecified: Secondary | ICD-10-CM | POA: Diagnosis not present

## 2021-02-14 ENCOUNTER — Other Ambulatory Visit: Payer: Self-pay

## 2021-02-14 ENCOUNTER — Encounter (HOSPITAL_BASED_OUTPATIENT_CLINIC_OR_DEPARTMENT_OTHER): Payer: Medicare Other | Attending: Internal Medicine | Admitting: Internal Medicine

## 2021-02-14 DIAGNOSIS — I10 Essential (primary) hypertension: Secondary | ICD-10-CM | POA: Insufficient documentation

## 2021-02-14 DIAGNOSIS — S91301D Unspecified open wound, right foot, subsequent encounter: Secondary | ICD-10-CM

## 2021-02-14 DIAGNOSIS — X58XXXA Exposure to other specified factors, initial encounter: Secondary | ICD-10-CM | POA: Diagnosis not present

## 2021-02-14 DIAGNOSIS — S91301A Unspecified open wound, right foot, initial encounter: Secondary | ICD-10-CM | POA: Insufficient documentation

## 2021-02-14 DIAGNOSIS — Z87891 Personal history of nicotine dependence: Secondary | ICD-10-CM | POA: Diagnosis not present

## 2021-02-19 NOTE — Progress Notes (Signed)
[name] ([id number]) Visit Report for 02/14/2021 Arrival Information Details Patient Name: Date of Service: [name], [name] 02/14/2021 10:30 A M Medical Record Number: [id number] Patient Account Number: [id number] Date of Birth/Sex: Treating RN: [birth year] (87 y.o. [name] Primary Care Isha Seefeld: Shirlean Mylar Other Clinician: Referring Adrienne Trombetta: Treating Kennidi Yoshida/Extender: Donato Heinz in Treatment: 2 Visit Information History Since Last Visit Added or deleted any medications: No Patient Arrived: Ambulatory Any new allergies or adverse reactions: No Arrival Time: 10:42 Had a fall or experienced change in No Accompanied By: wife activities of daily living that may affect Transfer Assistance: None risk of falls: Patient Identification Verified: Yes Signs or symptoms of abuse/neglect since last visito No Secondary Verification Process Completed: Yes Hospitalized since last visit: No Patient Requires Transmission-Based Precautions: No Implantable device outside of the clinic excluding No Patient Has Alerts: Yes cellular tissue based products placed in the center Patient Alerts: R ABI=1.12, L ABI=1.13 since last visit: Has Dressing in Place as Prescribed: Yes Pain Present Now: No Electronic Signature(s) Signed: 02/14/2021 5:58:36 PM By: Antonieta Iba Entered By: Antonieta Iba on 02/14/2021 10:47:01 -------------------------------------------------------------------------------- Encounter Discharge Information Details Patient Name: Date of Service: [name]. 02/14/2021 10:30 A M Medical Record Number: [id number] Patient Account Number: [id number] Date of Birth/Sex: Treating RN: Jan 22, 1933 (87 y.o. [name] Primary Care Corby Villasenor: Shirlean Mylar Other Clinician: Referring Jorah Hua: Treating Sovereign Ramiro/Extender: Donato Heinz in Treatment: 2 Encounter Discharge Information Items Post Procedure Vitals Discharge  Condition: Stable Temperature (F): 97.8 Ambulatory Status: Ambulatory Pulse (bpm): 74 Discharge Destination: Home Respiratory Rate (breaths/min): 18 Transportation: Private Auto Blood Pressure (mmHg): 162/80 Accompanied By: wife Schedule Follow-up Appointment: Yes Clinical Summary of Care: Provided on 02/14/2021 Form Type Recipient Paper Patient Patient Electronic Signature(s) Signed: 02/14/2021 5:58:36 PM By: Antonieta Iba Entered By: Antonieta Iba on 02/14/2021 11:21:46 -------------------------------------------------------------------------------- Lower Extremity Assessment Details Patient Name: Date of Service: [name] 02/14/2021 10:30 A M Medical Record Number: [id number] Patient Account Number: [id number] Date of Birth/Sex: Treating RN: 1932-07-18 (87 y.o. [name] Primary Care Calob Baskette: Shirlean Mylar Other Clinician: Referring Waverly Chavarria: Treating Zailyn Thoennes/Extender: Donato Heinz in Treatment: 2 Edema Assessment Assessed: [Left: No] [Right: Yes] Edema: [Left: N] [Right: o] Calf Left: Right: Point of Measurement: From Medial Instep 28.4 cm Ankle Left: Right: Point of Measurement: From Medial Instep 18 cm Vascular Assessment Pulses: Dorsalis Pedis Palpable: [Right:Yes] Electronic Signature(s) Signed: 02/14/2021 5:58:36 PM By: Antonieta Iba Entered By: Antonieta Iba on 02/14/2021 10:51:43 -------------------------------------------------------------------------------- Multi Wound Chart Details Patient Name: Date of Service: [name]. 02/14/2021 10:30 A M Medical Record Number: [id number] Patient Account Number: [id number] Date of Birth/Sex: Treating RN: 21-Feb-1933 (87 y.o. Charlean Merl, Lauren Primary Care Mahum Betten: Shirlean Mylar Other Clinician: Referring Malyna Budney: Treating Akima Slaugh/Extender: Donato Heinz in Treatment: 2 Vital Signs Height(in): 60 Pulse(bpm): 74 Weight(lbs): 122 Blood  Pressure(mmHg): 162/80 Body Mass Index(BMI): 24 Temperature(F): 97.8 Respiratory Rate(breaths/min): 18 Photos: [1:Right, Lateral Achilles] [N/A:N/A N/A] Wound Location: [1:Gradually Appeared] [N/A:N/A] Wounding Event: [1:Atypical] [N/A:N/A] Primary Etiology: [1:Cataracts, Hypertension,] [N/A:N/A] Comorbid History: [1:Osteoarthritis 05/16/2020] [N/A:N/A] Date Acquired: [1:2] [N/A:N/A] Weeks of Treatment: [1:Open] [N/A:N/A] Wound Status: [1:1x1.4x0.3] [N/A:N/A] Measurements L x W x D (cm) [1:1.1] [N/A:N/A] A (cm) : rea [1:0.33] [N/A:N/A] Volume (cm) : [1:2.00%] [N/A:N/A] % Reduction in A [1:rea: -194.60%] [N/A:N/A] % Reduction in Volume: [1:Full Thickness Without Exposed] [N/A:N/A] Classification: [1:Support Structures Medium] [N/A:N/A] Exudate A mount: [1:Serous] [N/A:N/A] Exudate Type: [1:amber] [N/A:N/A] Exudate  Color: [1:Distinct, outline attached] [N/A:N/A] Wound Margin: [1:Medium (34-66%)] [N/A:N/A] Granulation A mount: [1:Pink] [N/A:N/A] Granulation Quality: [1:Medium (34-66%)] [N/A:N/A] Necrotic A mount: [1:Fat Layer (Subcutaneous Tissue): Yes N/A] Exposed Structures: [1:Fascia: No Tendon: No Muscle: No Joint: No Bone: No Small (1-33%)] [N/A:N/A] Epithelialization: [1:Debridement - Excisional] [N/A:N/A] Debridement: Pre-procedure Verification/Time Out 11:00 [N/A:N/A] Taken: [1:Other] [N/A:N/A] Pain Control: [1:Subcutaneous, Slough] [N/A:N/A] Tissue Debrided: [1:Skin/Subcutaneous Tissue] [N/A:N/A] Level: [1:1.4] [N/A:N/A] Debridement A (sq cm): [1:rea Curette] [N/A:N/A] Instrument: [1:Minimum] [N/A:N/A] Bleeding: [1:Pressure] [N/A:N/A] Hemostasis A chieved: [1:Procedure was tolerated well] [N/A:N/A] Debridement Treatment Response: [1:1x1.4x0.3] [N/A:N/A] Post Debridement Measurements L x W x D (cm) [1:0.33] [N/A:N/A] Post Debridement Volume: (cm) [1:Debridement] [N/A:N/A] Treatment Notes Wound #1 (Achilles) Wound Laterality: Right, Lateral Cleanser Soap and  Water Discharge Instruction: May shower and wash wound with dial antibacterial soap and water prior to dressing change. Peri-Wound Care Skin Prep Discharge Instruction: Use skin prep as directed Topical Primary Dressing Santyl Ointment Discharge Instruction: Apply nickel thick amount to wound bed as instructed Secondary Dressing Zetuvit Plus Silicone Border Dressing 4x4 (in/in) Discharge Instruction: Apply silicone border over primary dressing as directed. Secured With Compression Wrap Compression Stockings Facilities manager) Signed: 02/14/2021 1:23:11 PM By: Geralyn Corwin DO Signed: 02/19/2021 5:02:53 PM By: Fonnie Mu RN Entered By: Geralyn Corwin on 02/14/2021 11:26:06 -------------------------------------------------------------------------------- Multi-Disciplinary Care Plan Details Patient Name: Date of Service: [name]. 02/14/2021 10:30 A M Medical Record Number: [id number] Patient Account Number: [id number] Date of Birth/Sex: Treating RN: [birth year] (87 y.o. [name] Primary Care Seiya Silsby: Shirlean Mylar Other Clinician: Referring Florean Hoobler: Treating Xzavier Swinger/Extender: Donato Heinz in Treatment: 2 Multidisciplinary Care Plan reviewed with physician Active Inactive Wound/Skin Impairment Nursing Diagnoses: Impaired tissue integrity Knowledge deficit related to ulceration/compromised skin integrity Goals: Patient/caregiver will verbalize understanding of skin care regimen Date Initiated: 01/28/2021 Target Resolution Date: 02/25/2021 Goal Status: Active Interventions: Assess patient/caregiver ability to obtain necessary supplies Assess patient/caregiver ability to perform ulcer/skin care regimen upon admission and as needed Assess ulceration(s) every visit Provide education on ulcer and skin care Notes: Electronic Signature(s) Signed: 02/14/2021 5:58:36 PM By: Antonieta Iba Entered By: Antonieta Iba on  02/14/2021 10:41:59 -------------------------------------------------------------------------------- Pain Assessment Details Patient Name: Date of Service: [name]. 02/14/2021 10:30 A M Medical Record Number: [id number] Patient Account Number: [id number] Date of Birth/Sex: Treating RN: 07/19/1932 (87 y.o. [name] Primary Care Alisabeth Selkirk: Shirlean Mylar Other Clinician: Referring Terald Jump: Treating Leray Garverick/Extender: Donato Heinz in Treatment: 2 Active Problems Location of Pain Severity and Description of Pain Patient Has Paino No Site Locations Pain Management and Medication Current Pain Management: Electronic Signature(s) Signed: 02/14/2021 5:58:36 PM By: Antonieta Iba Entered By: Antonieta Iba on 02/14/2021 10:47:29 -------------------------------------------------------------------------------- Patient/Caregiver Education Details Patient Name: Date of Service: [name] 10/6/2022andnbsp10:30 A M Medical Record Number: [id number] Patient Account Number: [id number] Date of Birth/Gender: Treating RN: 03-19-1933 (87 y.o. [name] Primary Care Physician: Shirlean Mylar Other Clinician: Referring Physician: Treating Physician/Extender: Donato Heinz in Treatment: 2 Education Assessment Education Provided To: Patient Education Topics Provided Wound/Skin Impairment: Methods: Demonstration, Explain/Verbal, Printed Responses: State content correctly Electronic Signature(s) Signed: 02/14/2021 5:58:36 PM By: Antonieta Iba Entered By: Antonieta Iba on 02/14/2021 10:42:19 -------------------------------------------------------------------------------- Wound Assessment Details Patient Name: Date of Service: [name]. 02/14/2021 10:30 A M Medical Record Number: [id number] Patient Account Number: [id number] Date of Birth/Sex: Treating RN: 03-28-1933 (87 y.o. [name] Primary Care Keyonni Percival:  Shirlean Mylar Other Clinician: Referring Jamilah Jean: Treating  Brindley Madarang/Extender: Donato Heinz in Treatment: 2 Wound Status Wound Number: 1 Primary Etiology: Atypical Wound Location: Right, Lateral Achilles Wound Status: Open Wounding Event: Gradually Appeared Comorbid History: Cataracts, Hypertension, Osteoarthritis Date Acquired: 05/16/2020 Weeks Of Treatment: 2 Clustered Wound: No Photos Wound Measurements Length: (cm) 1 Width: (cm) 1.4 Depth: (cm) 0.3 Area: (cm) 1.1 Volume: (cm) 0.33 % Reduction in Area: 2% % Reduction in Volume: -194.6% Epithelialization: Small (1-33%) Tunneling: No Undermining: No Wound Description Classification: Full Thickness Without Exposed Support Structures Wound Margin: Distinct, outline attached Exudate Amount: Medium Exudate Type: Serous Exudate Color: amber Foul Odor After Cleansing: No Slough/Fibrino Yes Wound Bed Granulation Amount: Medium (34-66%) Exposed Structure Granulation Quality: Pink Fascia Exposed: No Necrotic Amount: Medium (34-66%) Fat Layer (Subcutaneous Tissue) Exposed: Yes Tendon Exposed: No Muscle Exposed: No Joint Exposed: No Bone Exposed: No Treatment Notes Wound #1 (Achilles) Wound Laterality: Right, Lateral Cleanser Soap and Water Discharge Instruction: May shower and wash wound with dial antibacterial soap and water prior to dressing change. Peri-Wound Care Skin Prep Discharge Instruction: Use skin prep as directed Topical Primary Dressing Santyl Ointment Discharge Instruction: Apply nickel thick amount to wound bed as instructed Secondary Dressing Zetuvit Plus Silicone Border Dressing 4x4 (in/in) Discharge Instruction: Apply silicone border over primary dressing as directed. Secured With Compression Wrap Compression Stockings Facilities manager) Signed: 02/14/2021 5:58:36 PM By: Antonieta Iba Entered By: Antonieta Iba on 02/14/2021  10:53:36 -------------------------------------------------------------------------------- Vitals Details Patient Name: Date of Service: [name]. 02/14/2021 10:30 A M Medical Record Number: [id number] Patient Account Number: [id number] Date of Birth/Sex: Treating RN: 1932-05-23 (87 y.o. [name] Primary Care Inaya Gillham: Shirlean Mylar Other Clinician: Referring Deyonte Cadden: Treating Seva Chancy/Extender: Donato Heinz in Treatment: 2 Vital Signs Time Taken: 10:47 Temperature (F): 97.8 Height (in): 60 Pulse (bpm): 74 Weight (lbs): 122 Respiratory Rate (breaths/min): 18 Body Mass Index (BMI): 23.8 Blood Pressure (mmHg): 162/80 Reference Range: 80 - 120 mg / dl Electronic Signature(s) Signed: 02/14/2021 5:58:36 PM By: Antonieta Iba Entered By: Antonieta Iba on 02/14/2021 10:47:23

## 2021-02-19 NOTE — Progress Notes (Signed)
[name], [name] ([id number]) Visit Report for 02/14/2021 Chief Complaint Document Details Patient Name: Date of Service: [name], [name] 02/14/2021 10:30 A M Medical Record Number: [id number] Patient Account Number: [id number] Date of Birth/Sex: Treating RN: 09/10/1932 (87 y.o. [name], [name] Primary Care Provider: Shirlean [name] Other Clinician: Referring Provider: Treating Provider/Extender: [name] in Treatment: 2 Information Obtained from: Patient Chief Complaint Right foot wound Electronic Signature(s) Signed: 02/14/2021 1:23:11 PM By: [name] Corwin DO Entered By: [name] on 02/14/2021 11:26:17 -------------------------------------------------------------------------------- Debridement Details Patient Name: Date of Service: [name]. 02/14/2021 10:30 A M Medical Record Number: [id number] Patient Account Number: [id number] Date of Birth/Sex: Treating RN: May 12, 1933 (87 y.o. [name] Primary Care Provider: Shirlean [name] Other Clinician: Referring Provider: Treating Provider/Extender: [name] in Treatment: 2 Debridement Performed for Assessment: Wound #1 Right,Lateral Achilles Performed By: Physician [name] Corwin, DO Debridement Type: Debridement Level of Consciousness (Pre-procedure): Awake and Alert Pre-procedure Verification/Time Out Yes - 11:00 Taken: Start Time: 11:01 Pain Control: Other : benzocaine 20% T Area Debrided (L x W): otal 1 (cm) x 1.4 (cm) = 1.4 (cm) Tissue and other material debrided: Non-Viable, Slough, Subcutaneous, Slough Level: Skin/Subcutaneous Tissue Debridement Description: Excisional Instrument: Curette Bleeding: Minimum Hemostasis Achieved: Pressure End Time: 11:05 Response to Treatment: Procedure was tolerated well Level of Consciousness (Post- Awake and Alert procedure): Post Debridement Measurements of Total Wound Length: (cm) 1 Width: (cm) 1.4 Depth:  (cm) 0.3 Volume: (cm) 0.33 Character of Wound/Ulcer Post Debridement: Stable Post Procedure Diagnosis Same as Pre-procedure Electronic Signature(s) Signed: 02/14/2021 1:23:11 PM By: [name] Corwin DO Signed: 02/14/2021 5:58:36 PM By: [name] Entered By: [name] on 02/14/2021 11:05:58 -------------------------------------------------------------------------------- HPI Details Patient Name: Date of Service: [name]. 02/14/2021 10:30 A M Medical Record Number: [id number] Patient Account Number: [id number] Date of Birth/Sex: Treating RN: 1932-07-30 (87 y.o. [name] Primary Care Provider: Shirlean [name] Other Clinician: Referring Provider: Treating Provider/Extender: [name] in Treatment: 2 History of Present Illness HPI Description: Admission 01/28/2021 Mr. Alastor Kneale is an 87 year old male with a past medical history of essential hypertension that presents to the clinic for a 55-month history of nonhealing wound to his right ankle. He states that he thinks the wound started From his shoes rubbing the back of his ankle. He switched shoes however the wound never healed. He is currently been keeping the area covered and applying antibiotic ointment. He denies signs of infection. 9/27; patient presents for follow-up. He has been using Santyl on the wound bed daily. He has no issues or complaints today. He denies signs of infection. 10/6; patient presents for follow-up. He has been using Santyl on the wound bed daily. He continues to have chronic pain to this area. He denies signs of infection. Electronic Signature(s) Signed: 02/14/2021 1:23:11 PM By: [name] Corwin DO Entered By: [name] on 02/14/2021 11:26:40 -------------------------------------------------------------------------------- Physical Exam Details Patient Name: Date of Service: [name], [name] 02/14/2021 10:30 A M Medical Record Number: [id number] Patient  Account Number: [id number] Date of Birth/Sex: Treating RN: 1932-08-30 (87 y.o. [name], [name] Primary Care Provider: Shirlean [name] Other Clinician: Referring Provider: Treating Provider/Extender: [name] in Treatment: 2 Constitutional respirations regular, non-labored and within target range for patient.. Cardiovascular 2+ dorsalis pedis/posterior tibialis pulses. Psychiatric pleasant and cooperative. Notes Right posterior ankle: Open wound with scant granulation tissue and slough. No evidence of active infection. Electronic Signature(s) Signed: 02/14/2021 1:23:11 PM  By: [name] Corwin DO Entered By: [name] on 02/14/2021 11:27:21 -------------------------------------------------------------------------------- Physician Orders Details Patient Name: Date of Service: [name]. 02/14/2021 10:30 A M Medical Record Number: [id number] Patient Account Number: [id number] Date of Birth/Sex: Treating RN: 1932/08/25 (87 y.o. [name] Primary Care Provider: Shirlean [name] Other Clinician: Referring Provider: Treating Provider/Extender: [name] in Treatment: 2 Verbal / Phone Orders: No Diagnosis Coding ICD-10 Coding Code Description S91.301D Unspecified open wound, right foot, subsequent encounter I10 Essential (primary) hypertension Follow-up Appointments ppointment in 2 weeks. - with Dr. Mikey Bussing Return A Other: - Please obtain results for vascular testing of ABI with TBI. Can be faxed to [phone] Bathing/ Shower/ Hygiene May shower and wash wound with soap and water. Wound Treatment Wound #1 - Achilles Wound Laterality: Right, Lateral Cleanser: Soap and Water 1 x Per Day/30 Days Discharge Instructions: May shower and wash wound with dial antibacterial soap and water prior to dressing change. Peri-Wound Care: Skin Prep 1 x Per Day/30 Days Discharge Instructions: Use skin prep as directed Prim  Dressing: Santyl Ointment 1 x Per Day/30 Days ary Discharge Instructions: Apply nickel thick amount to wound bed as instructed Secondary Dressing: Zetuvit Plus Silicone Border Dressing 4x4 (in/in) (Generic) 1 x Per Day/30 Days Discharge Instructions: Apply silicone border over primary dressing as directed. Electronic Signature(s) Signed: 02/14/2021 1:23:11 PM By: [name] Corwin DO Entered By: [name] on 02/14/2021 11:27:44 -------------------------------------------------------------------------------- Problem List Details Patient Name: Date of Service: [name]. 02/14/2021 10:30 A M Medical Record Number: [id number] Patient Account Number: [id number] Date of Birth/Sex: Treating RN: Apr 20, 1933 (87 y.o. [name] Primary Care Provider: Shirlean [name] Other Clinician: Referring Provider: Treating Provider/Extender: [name] in Treatment: 2 Active Problems ICD-10 Encounter Code Description Active Date MDM Diagnosis S91.301D Unspecified open wound, right foot, subsequent encounter 02/05/2021 No Yes I10 Essential (primary) hypertension 01/28/2021 No Yes Inactive Problems ICD-10 Code Description Active Date Inactive Date S91.301A Unspecified open wound, right foot, initial encounter 01/28/2021 01/29/2021 Resolved Problems Electronic Signature(s) Signed: 02/14/2021 1:23:11 PM By: [name] Corwin DO Entered By: [name] on 02/14/2021 11:26:00 -------------------------------------------------------------------------------- Progress Note Details Patient Name: Date of Service: [name]. 02/14/2021 10:30 A M Medical Record Number: [id number] Patient Account Number: [id number] Date of Birth/Sex: Treating RN: 1932-06-13 (86 y.o. [name], [name] Primary Care Provider: Shirlean [name] Other Clinician: Referring Provider: Treating Provider/Extender: [name] in Treatment: 2 Subjective Chief  Complaint Information obtained from Patient Right foot wound History of Present Illness (HPI) Admission 01/28/2021 Mr. Jhett Fretwell is an 87 year old male with a past medical history of essential hypertension that presents to the clinic for a 78-month history of nonhealing wound to his right ankle. He states that he thinks the wound started From his shoes rubbing the back of his ankle. He switched shoes however the wound never healed. He is currently been keeping the area covered and applying antibiotic ointment. He denies signs of infection. 9/27; patient presents for follow-up. He has been using Santyl on the wound bed daily. He has no issues or complaints today. He denies signs of infection. 10/6; patient presents for follow-up. He has been using Santyl on the wound bed daily. He continues to have chronic pain to this area. He denies signs of infection. Patient History Information obtained from Patient. Family History Cancer - Siblings, Diabetes - Siblings, Kidney Disease - Siblings, No family history of Heart Disease, Hereditary Spherocytosis, Hypertension, Lung Disease, Seizures, Stroke,  Thyroid Problems, Tuberculosis. Social History Former smoker - quit age 44, Marital Status - Married, Alcohol Use - Never, Drug Use - No History, Caffeine Use - Daily - coffee. Medical History Eyes Patient has history of Cataracts - bil removed Denies history of Glaucoma, Optic Neuritis Ear/Nose/Mouth/Throat Denies history of Chronic sinus problems/congestion, Middle ear problems Cardiovascular Patient has history of Hypertension Musculoskeletal Patient has history of Osteoarthritis Oncologic Denies history of Received Chemotherapy, Received Radiation Psychiatric Denies history of Anorexia/bulimia, Confinement Anxiety Hospitalization/Surgery History - bil rotator cuff surgeries. Medical A Surgical History Notes nd Ear/Nose/Mouth/Throat extremely hard of hearing Neurologic hx cervical  fractures Objective Constitutional respirations regular, non-labored and within target range for patient.. Vitals Time Taken: 10:47 AM, Height: 60 in, Weight: 122 lbs, BMI: 23.8, Temperature: 97.8 F, Pulse: 74 bpm, Respiratory Rate: 18 breaths/min, Blood Pressure: 162/80 mmHg. Cardiovascular 2+ dorsalis pedis/posterior tibialis pulses. Psychiatric pleasant and cooperative. General Notes: Right posterior ankle: Open wound with scant granulation tissue and slough. No evidence of active infection. Integumentary (Hair, Skin) Wound #1 status is Open. Original cause of wound was Gradually Appeared. The date acquired was: 05/16/2020. The wound has been in treatment 2 weeks. The wound is located on the Right,Lateral Achilles. The wound measures 1cm length x 1.4cm width x 0.3cm depth; 1.1cm^2 area and 0.33cm^3 volume. There is Fat Layer (Subcutaneous Tissue) exposed. There is no tunneling or undermining noted. There is a medium amount of serous drainage noted. The wound margin is distinct with the outline attached to the wound base. There is medium (34-66%) pink granulation within the wound bed. There is a medium (34-66%) amount of necrotic tissue within the wound bed. Assessment Active Problems ICD-10 Unspecified open wound, right foot, subsequent encounter Essential (primary) hypertension Patient's wound has shown improvement in appearance since last clinic visit. I debrided nonviable tissue. I recommended continuing Santyl to the wound bed. Per our records we have ABIs of 1.12 on the right however no TBI's listed. Per EMR there are no records. Patient states he has had this study done in the past. I asked him to fax over the results. No obvious signs of infection on exam. If study has not been done we will order this at next clinic visit. Procedures Wound #1 Pre-procedure diagnosis of Wound #1 is an Atypical located on the Right,Lateral Achilles . There was a Excisional Skin/Subcutaneous Tissue  Debridement with a total area of 1.4 sq cm performed by [name] Corwin, DO. With the following instrument(s): Curette to remove Non-Viable tissue/material. Material removed includes Subcutaneous Tissue and Slough and after achieving pain control using Other (benzocaine 20%). No specimens were taken. A time out was conducted at 11:00, prior to the start of the procedure. A Minimum amount of bleeding was controlled with Pressure. The procedure was tolerated well. Post Debridement Measurements: 1cm length x 1.4cm width x 0.3cm depth; 0.33cm^3 volume. Character of Wound/Ulcer Post Debridement is stable. Post procedure Diagnosis Wound #1: Same as Pre-Procedure Plan Follow-up Appointments: Return Appointment in 2 weeks. - with Dr. Mikey Bussing Other: - Please obtain results for vascular testing of ABI with TBI. Can be faxed to [phone] Bathing/ Shower/ Hygiene: May shower and wash wound with soap and water. WOUND #1: - Achilles Wound Laterality: Right, Lateral Cleanser: Soap and Water 1 x Per Day/30 Days Discharge Instructions: May shower and wash wound with dial antibacterial soap and water prior to dressing change. Peri-Wound Care: Skin Prep 1 x Per Day/30 Days Discharge Instructions: Use skin prep as directed Prim Dressing: Santyl Ointment  1 x Per Day/30 Days ary Discharge Instructions: Apply nickel thick amount to wound bed as instructed Secondary Dressing: Zetuvit Plus Silicone Border Dressing 4x4 (in/in) (Generic) 1 x Per Day/30 Days Discharge Instructions: Apply silicone border over primary dressing as directed. 1. In office sharp debridement 2. Santyl daily 3. Follow-up in 2 weeks Electronic Signature(s) Signed: 02/14/2021 1:23:11 PM By: [name] Corwin DO Entered By: [name] on 02/14/2021 11:29:41 -------------------------------------------------------------------------------- HxROS Details Patient Name: Date of Service: [name]. 02/14/2021 10:30 A M Medical  Record Number: [id number] Patient Account Number: [id number] Date of Birth/Sex: Treating RN: April 04, 1933 (87 y.o. [name], [name] Primary Care Provider: Shirlean [name] Other Clinician: Referring Provider: Treating Provider/Extender: [name] in Treatment: 2 Information Obtained From Patient Eyes Medical History: Positive for: Cataracts - bil removed Negative for: Glaucoma; Optic Neuritis Ear/Nose/Mouth/Throat Medical History: Negative for: Chronic sinus problems/congestion; Middle ear problems Past Medical History Notes: extremely hard of hearing Cardiovascular Medical History: Positive for: Hypertension Musculoskeletal Medical History: Positive for: Osteoarthritis Neurologic Medical History: Past Medical History Notes: hx cervical fractures Oncologic Medical History: Negative for: Received Chemotherapy; Received Radiation Psychiatric Medical History: Negative for: Anorexia/bulimia; Confinement Anxiety HBO Extended History Items Eyes: Cataracts Immunizations Pneumococcal Vaccine: Received Pneumococcal Vaccination: Yes Received Pneumococcal Vaccination On or After 60th Birthday: Yes Implantable Devices None Hospitalization / Surgery History Type of Hospitalization/Surgery bil rotator cuff surgeries Family and Social History Cancer: Yes - Siblings; Diabetes: Yes - Siblings; Heart Disease: No; Hereditary Spherocytosis: No; Hypertension: No; Kidney Disease: Yes - Siblings; Lung Disease: No; Seizures: No; Stroke: No; Thyroid Problems: No; Tuberculosis: No; Former smoker - quit age 47; Marital Status - Married; Alcohol Use: Never; Drug Use: No History; Caffeine Use: Daily - coffee; Financial Concerns: No; Food, Clothing or Shelter Needs: No; Support System Lacking: No; Transportation Concerns: No Electronic Signature(s) Signed: 02/14/2021 1:23:11 PM By: [name] Corwin DO Signed: 02/19/2021 5:02:53 PM By: Fonnie Mu RN Entered By:  [name] on 02/14/2021 11:26:45 -------------------------------------------------------------------------------- SuperBill Details Patient Name: Date of Service: [name] 02/14/2021 Medical Record Number: [id number] Patient Account Number: [id number] Date of Birth/Sex: Treating RN: November 10, 1932 (87 y.o. [name] Primary Care Provider: Shirlean [name] Other Clinician: Referring Provider: Treating Provider/Extender: [name] in Treatment: 2 Diagnosis Coding ICD-10 Codes Code Description I10 Essential (primary) hypertension S91.301D Unspecified open wound, right foot, subsequent encounter Facility Procedures CPT4 Code: [id number] Description: 11042 - DEB SUBQ TISSUE 20 SQ CM/< ICD-10 Diagnosis Description S91.301D Unspecified open wound, right foot, subsequent encounter Modifier: Quantity: 1 Physician Procedures : CPT4 Code Description Modifier [id number] 11042 - WC PHYS SUBQ TISS 20 SQ CM ICD-10 Diagnosis Description S91.301D Unspecified open wound, right foot, subsequent encounter Quantity: 1 Electronic Signature(s) Signed: 02/14/2021 1:23:11 PM By: [name] Corwin DO Signed: 02/14/2021 1:23:11 PM By: [name] Corwin DO Entered By: [name] on 02/14/2021 11:29:47

## 2021-02-28 ENCOUNTER — Other Ambulatory Visit: Payer: Self-pay

## 2021-02-28 ENCOUNTER — Encounter (HOSPITAL_BASED_OUTPATIENT_CLINIC_OR_DEPARTMENT_OTHER): Payer: Medicare Other | Admitting: Internal Medicine

## 2021-02-28 DIAGNOSIS — S91301D Unspecified open wound, right foot, subsequent encounter: Secondary | ICD-10-CM

## 2021-02-28 DIAGNOSIS — Z87891 Personal history of nicotine dependence: Secondary | ICD-10-CM | POA: Diagnosis not present

## 2021-02-28 DIAGNOSIS — S91301A Unspecified open wound, right foot, initial encounter: Secondary | ICD-10-CM | POA: Diagnosis not present

## 2021-02-28 DIAGNOSIS — I10 Essential (primary) hypertension: Secondary | ICD-10-CM | POA: Diagnosis not present

## 2021-03-01 DIAGNOSIS — S91009A Unspecified open wound, unspecified ankle, initial encounter: Secondary | ICD-10-CM | POA: Diagnosis not present

## 2021-03-05 NOTE — Progress Notes (Signed)
[name], [name] ([id number]) Visit Report for 02/28/2021 Arrival Information Details Patient Name: Date of Service: [name], [name] 02/28/2021 10:15 A M Medical Record Number: [id number] Patient Account Number: [id number] Date of Birth/Sex: Treating RN: 03/14/1933 (87 y.o. Charlean Merl, Lauren Primary Care Kindred Reidinger: Shirlean Mylar Other Clinician: Referring Agata Lucente: Treating Zair Borawski/Extender: Donato Heinz in Treatment: 4 Visit Information History Since Last Visit Added or deleted any medications: No Patient Arrived: Ambulatory Any new allergies or adverse reactions: No Arrival Time: 10:27 Had a fall or experienced change in No Accompanied By: wife activities of daily living that may affect Transfer Assistance: None risk of falls: Patient Identification Verified: Yes Signs or symptoms of abuse/neglect since last visito No Secondary Verification Process Completed: Yes Hospitalized since last visit: No Patient Requires Transmission-Based Precautions: No Implantable device outside of the clinic excluding No Patient Has Alerts: Yes cellular tissue based products placed in the center Patient Alerts: R ABI=1.12, L ABI=1.13 since last visit: Has Dressing in Place as Prescribed: Yes Pain Present Now: Yes Electronic Signature(s) Signed: 03/05/2021 2:37:05 PM By: Karl Ito Entered By: Karl Ito on 02/28/2021 10:28:47 -------------------------------------------------------------------------------- Encounter Discharge Information Details Patient Name: Date of Service: [name] 02/28/2021 10:15 A M Medical Record Number: [id number] Patient Account Number: [id number] Date of Birth/Sex: Treating RN: Sep 27, 1932 (87 y.o. Charlean Merl, Lauren Primary Care Evyn Putzier: Shirlean Mylar Other Clinician: Referring Bethzy Hauck: Treating Andrw Mcguirt/Extender: Donato Heinz in Treatment: 4 Encounter Discharge Information Items Post Procedure  Vitals Discharge Condition: Stable Temperature (F): 98.7 Ambulatory Status: Ambulatory Pulse (bpm): 74 Discharge Destination: Home Respiratory Rate (breaths/min): 17 Transportation: Private Auto Blood Pressure (mmHg): 134/74 Accompanied By: [facility] Schedule Follow-up Appointment: Yes Clinical Summary of Care: Patient Declined Electronic Signature(s) Signed: 03/05/2021 5:23:14 PM By: Fonnie Mu RN Entered By: Fonnie Mu on 02/28/2021 10:58:58 -------------------------------------------------------------------------------- Lower Extremity Assessment Details Patient Name: Date of Service: [name] 02/28/2021 10:15 A M Medical Record Number: [id number] Patient Account Number: [id number] Date of Birth/Sex: Treating RN: 03-24-1933 (87 y.o. Charlean Merl, Lauren Primary Care Nilesh Stegall: Shirlean Mylar Other Clinician: Referring Jennalynn Rivard: Treating Azlin Zilberman/Extender: Donato Heinz in Treatment: 4 Edema Assessment Assessed: Kyra Searles: No] Franne Forts: Yes] Edema: [Left: N] [Right: o] Calf Left: Right: Point of Measurement: From Medial Instep 28.4 cm Ankle Left: Right: Point of Measurement: From Medial Instep 18 cm Vascular Assessment Pulses: Dorsalis Pedis Palpable: [Right:Yes] Posterior Tibial Palpable: [Right:Yes] Electronic Signature(s) Signed: 03/05/2021 5:23:14 PM By: Fonnie Mu RN Entered By: Fonnie Mu on 02/28/2021 10:42:49 -------------------------------------------------------------------------------- Multi Wound Chart Details Patient Name: Date of Service: [name]. 02/28/2021 10:15 A M Medical Record Number: [id number] Patient Account Number: [id number] Date of Birth/Sex: Treating RN: [birth year] (87 y.o. Charlean Merl, Lauren Primary Care Tanis Burnley: Shirlean Mylar Other Clinician: Referring Darina Hartwell: Treating Terralyn Matsumura/Extender: Donato Heinz in Treatment: 4 Vital Signs Height(in):  60 Pulse(bpm): 79 Weight(lbs): 122 Blood Pressure(mmHg): 158/86 Body Mass Index(BMI): 24 Temperature(F): 97.6 Respiratory Rate(breaths/min): 18 Photos: [1:Right, Lateral Achilles] [N/A:N/A N/A] Wound Location: [1:Gradually Appeared] [N/A:N/A] Wounding Event: [1:Atypical] [N/A:N/A] Primary Etiology: [1:Cataracts, Hypertension,] [N/A:N/A] Comorbid History: [1:Osteoarthritis 05/16/2020] [N/A:N/A] Date Acquired: [1:4] [N/A:N/A] Weeks of Treatment: [1:Open] [N/A:N/A] Wound Status: [1:1.2x0.6x0.2] [N/A:N/A] Measurements L x W x D (cm) [1:0.565] [N/A:N/A] A (cm) : rea [1:0.113] [N/A:N/A] Volume (cm) : [1:49.70%] [N/A:N/A] % Reduction in A [1:rea: -0.90%] [N/A:N/A] % Reduction in Volume: [1:Full Thickness Without Exposed] [N/A:N/A] Classification: [1:Support Structures Medium] [N/A:N/A] Exudate A mount: [1:Serous] [N/A:N/A] Exudate Type: [1:amber] [N/A:N/A] Exudate Color: [  1:Distinct, outline attached] [N/A:N/A] Wound Margin: [1:Medium (34-66%)] [N/A:N/A] Granulation A mount: [1:Pink] [N/A:N/A] Granulation Quality: [1:Medium (34-66%)] [N/A:N/A] Necrotic A mount: [1:Fat Layer (Subcutaneous Tissue): Yes N/A] Exposed Structures: [1:Fascia: No Tendon: No Muscle: No Joint: No Bone: No Small (1-33%)] [N/A:N/A] Epithelialization: [1:Debridement - Excisional] [N/A:N/A] Debridement: Pre-procedure Verification/Time Out 10:45 [N/A:N/A] Taken: [1:Lidocaine] [N/A:N/A] Pain Control: [1:Subcutaneous, Slough] [N/A:N/A] Tissue Debrided: [1:Skin/Subcutaneous Tissue] [N/A:N/A] Level: [1:0.72] [N/A:N/A] Debridement A (sq cm): [1:rea Curette] [N/A:N/A] Instrument: [1:Minimum] [N/A:N/A] Bleeding: [1:Pressure] [N/A:N/A] Hemostasis A chieved: [1:0] [N/A:N/A] Procedural Pain: [1:0] [N/A:N/A] Post Procedural Pain: [1:Procedure was tolerated well] [N/A:N/A] Debridement Treatment Response: [1:1.2x0.6x0.2] [N/A:N/A] Post Debridement Measurements L x W x D (cm) [1:0.113] [N/A:N/A] Post Debridement  Volume: (cm) [1:Debridement] [N/A:N/A] Treatment Notes Wound #1 (Achilles) Wound Laterality: Right, Lateral Cleanser Soap and Water Discharge Instruction: May shower and wash wound with dial antibacterial soap and water prior to dressing change. Peri-Wound Care Skin Prep Discharge Instruction: Use skin prep as directed Topical Primary Dressing Hydrofera Blue Classic Foam, 2x2 in Discharge Instruction: Moisten with saline prior to applying to wound bed Santyl Ointment Discharge Instruction: Apply nickel thick amount to wound bed as instructed Secondary Dressing Zetuvit Plus Silicone Border Dressing 4x4 (in/in) Discharge Instruction: Apply silicone border over primary dressing as directed. Secured With Compression Wrap Compression Stockings Facilities manager) Signed: 02/28/2021 12:56:53 PM By: Geralyn Corwin DO Signed: 03/05/2021 5:23:14 PM By: Fonnie Mu RN Entered By: Geralyn Corwin on 02/28/2021 11:47:05 -------------------------------------------------------------------------------- Multi-Disciplinary Care Plan Details Patient Name: Date of Service: [name]. 02/28/2021 10:15 A M Medical Record Number: [id number] Patient Account Number: [id number] Date of Birth/Sex: Treating RN: April 14, 1933 (87 y.o. Charlean Merl, Lauren Primary Care Loomis Anacker: Shirlean Mylar Other Clinician: Referring Zayneb Baucum: Treating Ayde Record/Extender: Donato Heinz in Treatment: 4 Multidisciplinary Care Plan reviewed with physician Active Inactive Wound/Skin Impairment Nursing Diagnoses: Impaired tissue integrity Knowledge deficit related to ulceration/compromised skin integrity Goals: Patient/caregiver will verbalize understanding of skin care regimen Date Initiated: 01/28/2021 Target Resolution Date: 03/10/2021 Goal Status: Active Interventions: Assess patient/caregiver ability to obtain necessary supplies Assess patient/caregiver ability to  perform ulcer/skin care regimen upon admission and as needed Assess ulceration(s) every visit Provide education on ulcer and skin care Notes: Electronic Signature(s) Signed: 03/05/2021 5:23:14 PM By: Fonnie Mu RN Entered By: Fonnie Mu on 02/28/2021 10:50:53 -------------------------------------------------------------------------------- Pain Assessment Details Patient Name: Date of Service: [name] 02/28/2021 10:15 A M Medical Record Number: [id number] Patient Account Number: [id number] Date of Birth/Sex: Treating RN: 12/02/1932 (87 y.o. Charlean Merl, Lauren Primary Care Karenann Mcgrory: Shirlean Mylar Other Clinician: Referring Eloina Ergle: Treating Ivah Girardot/Extender: Donato Heinz in Treatment: 4 Active Problems Location of Pain Severity and Description of Pain Patient Has Paino Yes Site Locations Rate the pain. Rate the pain. Current Pain Level: 2 Pain Management and Medication Current Pain Management: Electronic Signature(s) Signed: 03/05/2021 2:37:05 PM By: Karl Ito Signed: 03/05/2021 5:23:14 PM By: Fonnie Mu RN Entered By: Karl Ito on 02/28/2021 10:29:27 -------------------------------------------------------------------------------- Patient/Caregiver Education Details Patient Name: Date of Service: [name] 10/20/2022andnbsp10:15 A M Medical Record Number: [id number] Patient Account Number: [id number] Date of Birth/Gender: Treating RN: 04-Jun-1932 (87 y.o. [name] Primary Care Physician: Shirlean Mylar Other Clinician: Referring Physician: Treating Physician/Extender: Donato Heinz in Treatment: 4 Education Assessment Education Provided To: Patient Education Topics Provided Wound/Skin Impairment: Methods: Explain/Verbal Responses: Reinforcements needed, State content correctly Electronic Signature(s) Signed: 03/05/2021 5:23:14 PM By: Fonnie Mu RN Entered  By: Fonnie Mu on 02/28/2021 10:51:09 --------------------------------------------------------------------------------  Wound Assessment Details Patient Name: Date of Service: [name], [name] 02/28/2021 10:15 A M Medical Record Number: [id number] Patient Account Number: [id number] Date of Birth/Sex: Treating RN: 05-Apr-1933 (87 y.o. Charlean Merl, Lauren Primary Care Jomel Whittlesey: Shirlean Mylar Other Clinician: Referring Shaianne Nucci: Treating Izaya Netherton/Extender: Donato Heinz in Treatment: 4 Wound Status Wound Number: 1 Primary Etiology: Atypical Wound Location: Right, Lateral Achilles Wound Status: Open Wounding Event: Gradually Appeared Comorbid History: Cataracts, Hypertension, Osteoarthritis Date Acquired: 05/16/2020 Weeks Of Treatment: 4 Clustered Wound: No Photos Wound Measurements Length: (cm) 1.2 Width: (cm) 0.6 Depth: (cm) 0.2 Area: (cm) 0.565 Volume: (cm) 0.113 % Reduction in Area: 49.7% % Reduction in Volume: -0.9% Epithelialization: Small (1-33%) Wound Description Classification: Full Thickness Without Exposed Support Structures Wound Margin: Distinct, outline attached Exudate Amount: Medium Exudate Type: Serous Exudate Color: amber Foul Odor After Cleansing: No Slough/Fibrino Yes Wound Bed Granulation Amount: Medium (34-66%) Exposed Structure Granulation Quality: Pink Fascia Exposed: No Necrotic Amount: Medium (34-66%) Fat Layer (Subcutaneous Tissue) Exposed: Yes Tendon Exposed: No Muscle Exposed: No Joint Exposed: No Bone Exposed: No Treatment Notes Wound #1 (Achilles) Wound Laterality: Right, Lateral Cleanser Soap and Water Discharge Instruction: May shower and wash wound with dial antibacterial soap and water prior to dressing change. Peri-Wound Care Skin Prep Discharge Instruction: Use skin prep as directed Topical Primary Dressing Hydrofera Blue Classic Foam, 2x2 in Discharge Instruction: Moisten with saline prior to  applying to wound bed Santyl Ointment Discharge Instruction: Apply nickel thick amount to wound bed as instructed Secondary Dressing Zetuvit Plus Silicone Border Dressing 4x4 (in/in) Discharge Instruction: Apply silicone border over primary dressing as directed. Secured With Compression Wrap Compression Stockings Facilities manager) Signed: 03/05/2021 2:37:05 PM By: Karl Ito Signed: 03/05/2021 5:23:14 PM By: Fonnie Mu RN Entered By: Karl Ito on 02/28/2021 10:33:14 -------------------------------------------------------------------------------- Vitals Details Patient Name: Date of Service: [name]. 02/28/2021 10:15 A M Medical Record Number: [id number] Patient Account Number: [id number] Date of Birth/Sex: Treating RN: [birth year] (87 y.o. Charlean Merl, Lauren Primary Care Zeyad Delaguila: Shirlean Mylar Other Clinician: Referring Latika Kronick: Treating [name] [name]/Extender: Donato Heinz in Treatment: 4 Vital Signs Time Taken: 10:28 Temperature (F): 97.6 Height (in): 60 Pulse (bpm): 79 Weight (lbs): 122 Respiratory Rate (breaths/min): 18 Body Mass Index (BMI): 23.8 Blood Pressure (mmHg): 158/86 Reference Range: 80 - 120 mg / dl Electronic Signature(s) Signed: 03/05/2021 2:37:05 PM By: Karl Ito Entered By: Karl Ito on 02/28/2021 10:29:12

## 2021-03-05 NOTE — Progress Notes (Signed)
[name] ([id number]) Visit Report for 02/28/2021 Chief Complaint Document Details Patient Name: Date of Service: [name] 02/28/2021 10:15 A M Medical Record Number: [id number] Patient Account Number: [id number] Date of Birth/Sex: Treating RN: [birth year] (87 y.o. [name] Primary Care Provider: Shirlean [name] Other Clinician: Referring Provider: Treating Provider/Extender: [name] in Treatment: 4 Information Obtained from: Patient Chief Complaint Right foot wound Electronic Signature(s) Signed: 02/28/2021 12:56:53 PM By: Geralyn Corwin DO Entered By: Geralyn Corwin on 02/28/2021 11:47:11 -------------------------------------------------------------------------------- Debridement Details Patient Name: Date of Service: [name] 02/28/2021 10:15 A M Medical Record Number: [id number] Patient Account Number: [id number] Date of Birth/Sex: Treating RN: [birth year] (87 y.o. [name] Primary Care Provider: Shirlean [name] Other Clinician: Referring Provider: Treating Provider/Extender: [name] in Treatment: 4 Debridement Performed for Assessment: Wound #1 Right,Lateral Achilles Performed By: Physician Geralyn Corwin, DO Debridement Type: Debridement Level of Consciousness (Pre-procedure): Awake and Alert Pre-procedure Verification/Time Out Yes - 10:45 Taken: Start Time: 10:45 Pain Control: Lidocaine T Area Debrided (L x W): otal 1.2 (cm) x 0.6 (cm) = 0.72 (cm) Tissue and other material debrided: Viable, Non-Viable, Slough, Subcutaneous, Skin: Dermis , Skin: Epidermis, Slough Level: Skin/Subcutaneous Tissue Debridement Description: Excisional Instrument: Curette Bleeding: Minimum Hemostasis Achieved: Pressure End Time: 10:45 Procedural Pain: 0 Post Procedural Pain: 0 Response to Treatment: Procedure was tolerated well Level of Consciousness (Post- Awake and Alert procedure): Post  Debridement Measurements of Total Wound Length: (cm) 1.2 Width: (cm) 0.6 Depth: (cm) 0.2 Volume: (cm) 0.113 Character of Wound/Ulcer Post Debridement: Improved Post Procedure Diagnosis Same as Pre-procedure Electronic Signature(s) Signed: 02/28/2021 12:56:53 PM By: Geralyn Corwin DO Signed: 03/05/2021 5:23:14 PM By: Fonnie Mu RN Entered By: Fonnie Mu on 02/28/2021 10:46:35 -------------------------------------------------------------------------------- HPI Details Patient Name: Date of Service: [name]. 02/28/2021 10:15 A M Medical Record Number: [id number] Patient Account Number: [id number] Date of Birth/Sex: Treating RN: [birth year] (87 y.o. [name] Primary Care Provider: Shirlean [name] Other Clinician: Referring Provider: Treating Provider/Extender: [name] in Treatment: 4 History of Present Illness HPI Description: Admission 01/28/2021 [name] is an 87 year old male with a past medical history of essential hypertension that presents to the clinic for a 104-month history of nonhealing wound to his right ankle. He states that he thinks the wound started From his shoes rubbing the back of his ankle. He switched shoes however the wound never healed. He is currently been keeping the area covered and applying antibiotic ointment. He denies signs of infection. 9/27; patient presents for follow-up. He has been using Santyl on the wound bed daily. He has no issues or complaints today. He denies signs of infection. 10/6; patient presents for follow-up. He has been using Santyl on the wound bed daily. He continues to have chronic pain to this area. He denies signs of infection. 10/20; patient presents for follow-up. He has been using Santyl to the wound beds daily. He has no issues or complaints today. He denies signs of infection. Electronic Signature(s) Signed: 02/28/2021 12:56:53 PM By: Geralyn Corwin DO Entered By:  Geralyn Corwin on 02/28/2021 11:47:32 -------------------------------------------------------------------------------- Physical Exam Details Patient Name: Date of Service: [name] 02/28/2021 10:15 A M Medical Record Number: [id number] Patient Account Number: [id number] Date of Birth/Sex: Treating RN: 14-Sep-1932 (87 y.o. [name] Primary Care Provider: Shirlean [name] Other Clinician: Referring Provider: Treating Provider/Extender: [name] in Treatment: 4 Constitutional respirations regular, non-labored and  within target range for patient.. Cardiovascular 2+ dorsalis pedis/posterior tibialis pulses. Psychiatric pleasant and cooperative. Notes Right posterior ankle: Open wound with scant granulation tissue and slough. No evidence of active infection. Electronic Signature(s) Signed: 02/28/2021 12:56:53 PM By: Geralyn Corwin DO Entered By: Geralyn Corwin on 02/28/2021 12:53:51 -------------------------------------------------------------------------------- Physician Orders Details Patient Name: Date of Service: [name] 02/28/2021 10:15 A M Medical Record Number: [id number] Patient Account Number: [id number] Date of Birth/Sex: Treating RN: 17-Nov-1932 (87 y.o. [name] Primary Care Provider: Shirlean [name] Other Clinician: Referring Provider: Treating Provider/Extender: [name] in Treatment: 4 Verbal / Phone Orders: No Diagnosis Coding ICD-10 Coding Code Description S91.301D Unspecified open wound, right foot, subsequent encounter I10 Essential (primary) hypertension Follow-up Appointments ppointment in 2 weeks. - with Dr. Mikey Bussing Return A Other: Bathing/ Shower/ Hygiene May shower and wash wound with soap and water. Wound Treatment Wound #1 - Achilles Wound Laterality: Right, Lateral Cleanser: Soap and Water 1 x Per Day/15 Days Discharge Instructions: May shower and wash wound  with dial antibacterial soap and water prior to dressing change. Peri-Wound Care: Skin Prep 1 x Per Day/15 Days Discharge Instructions: Use skin prep as directed Prim Dressing: Hydrofera Blue Classic Foam, 2x2 in 1 x Per Day/15 Days ary Discharge Instructions: Moisten with saline prior to applying to wound bed Prim Dressing: Santyl Ointment 1 x Per Day/15 Days ary Discharge Instructions: Apply nickel thick amount to wound bed as instructed Secondary Dressing: Zetuvit Plus Silicone Border Dressing 4x4 (in/in) (DME) (Generic) 1 x Per Day/15 Days Discharge Instructions: Apply silicone border over primary dressing as directed. Electronic Signature(s) Signed: 02/28/2021 12:56:53 PM By: Geralyn Corwin DO Entered By: Geralyn Corwin on 02/28/2021 12:54:06 -------------------------------------------------------------------------------- Problem List Details Patient Name: Date of Service: [name]. 02/28/2021 10:15 A M Medical Record Number: [id number] Patient Account Number: [id number] Date of Birth/Sex: Treating RN: 1933-02-19 (87 y.o. [name] Primary Care Provider: Shirlean [name] Other Clinician: Referring Provider: Treating Provider/Extender: [name] in Treatment: 4 Active Problems ICD-10 Encounter Code Description Active Date MDM Diagnosis S91.301D Unspecified open wound, right foot, subsequent encounter 02/05/2021 No Yes I10 Essential (primary) hypertension 01/28/2021 No Yes Inactive Problems ICD-10 Code Description Active Date Inactive Date S91.301A Unspecified open wound, right foot, initial encounter 01/28/2021 01/29/2021 Resolved Problems Electronic Signature(s) Signed: 02/28/2021 12:56:53 PM By: Geralyn Corwin DO Entered By: Geralyn Corwin on 02/28/2021 11:47:00 -------------------------------------------------------------------------------- Progress Note Details Patient Name: Date of Service: [name] 02/28/2021  10:15 A M Medical Record Number: [id number] Patient Account Number: [id number] Date of Birth/Sex: Treating RN: 1932/08/12 (87 y.o. [name] Primary Care Provider: Shirlean [name] Other Clinician: Referring Provider: Treating Provider/Extender: [name] in Treatment: 4 Subjective Chief Complaint Information obtained from Patient Right foot wound History of Present Illness (HPI) Admission 01/28/2021 [name] is an 87 year old male with a past medical history of essential hypertension that presents to the clinic for a 31-month history of nonhealing wound to his right ankle. He states that he thinks the wound started From his shoes rubbing the back of his ankle. He switched shoes however the wound never healed. He is currently been keeping the area covered and applying antibiotic ointment. He denies signs of infection. 9/27; patient presents for follow-up. He has been using Santyl on the wound bed daily. He has no issues or complaints today. He denies signs of infection. 10/6; patient presents for follow-up. He has been using Santyl on the  wound bed daily. He continues to have chronic pain to this area. He denies signs of infection. 10/20; patient presents for follow-up. He has been using Santyl to the wound beds daily. He has no issues or complaints today. He denies signs of infection. Patient History Information obtained from Patient. Family History Cancer - Siblings, Diabetes - Siblings, Kidney Disease - Siblings, No family history of Heart Disease, Hereditary Spherocytosis, Hypertension, Lung Disease, Seizures, Stroke, Thyroid Problems, Tuberculosis. Social History Former smoker - quit age 85, Marital Status - Married, Alcohol Use - Never, Drug Use - No History, Caffeine Use - Daily - coffee. Medical History Eyes Patient has history of Cataracts - bil removed Denies history of Glaucoma, Optic Neuritis Ear/Nose/Mouth/Throat Denies history of  Chronic sinus problems/congestion, Middle ear problems Cardiovascular Patient has history of Hypertension Musculoskeletal Patient has history of Osteoarthritis Oncologic Denies history of Received Chemotherapy, Received Radiation Psychiatric Denies history of Anorexia/bulimia, Confinement Anxiety Hospitalization/Surgery History - bil rotator cuff surgeries. Medical A Surgical History Notes nd Ear/Nose/Mouth/Throat extremely hard of hearing Neurologic hx cervical fractures Objective Constitutional respirations regular, non-labored and within target range for patient.. Vitals Time Taken: 10:28 AM, Height: 60 in, Weight: 122 lbs, BMI: 23.8, Temperature: 97.6 F, Pulse: 79 bpm, Respiratory Rate: 18 breaths/min, Blood Pressure: 158/86 mmHg. Cardiovascular 2+ dorsalis pedis/posterior tibialis pulses. Psychiatric pleasant and cooperative. General Notes: Right posterior ankle: Open wound with scant granulation tissue and slough. No evidence of active infection. Integumentary (Hair, Skin) Wound #1 status is Open. Original cause of wound was Gradually Appeared. The date acquired was: 05/16/2020. The wound has been in treatment 4 weeks. The wound is located on the Right,Lateral Achilles. The wound measures 1.2cm length x 0.6cm width x 0.2cm depth; 0.565cm^2 area and 0.113cm^3 volume. There is Fat Layer (Subcutaneous Tissue) exposed. There is a medium amount of serous drainage noted. The wound margin is distinct with the outline attached to the wound base. There is medium (34-66%) pink granulation within the wound bed. There is a medium (34-66%) amount of necrotic tissue within the wound bed. Assessment Active Problems ICD-10 Unspecified open wound, right foot, subsequent encounter Essential (primary) hypertension Patient's wound has shown improvement in size since last clinic visit. I debrided nonviable tissue. No signs of infection on exam. I recommended continuing Santyl but will add  Hydrofera Blue to help with further debridement. Follow-up in 1 week Procedures Wound #1 Pre-procedure diagnosis of Wound #1 is an Atypical located on the Right,Lateral Achilles . There was a Excisional Skin/Subcutaneous Tissue Debridement with a total area of 0.72 sq cm performed by Geralyn Corwin, DO. With the following instrument(s): Curette to remove Viable and Non-Viable tissue/material. Material removed includes Subcutaneous Tissue, Slough, Skin: Dermis, and Skin: Epidermis after achieving pain control using Lidocaine. No specimens were taken. A time out was conducted at 10:45, prior to the start of the procedure. A Minimum amount of bleeding was controlled with Pressure. The procedure was tolerated well with a pain level of 0 throughout and a pain level of 0 following the procedure. Post Debridement Measurements: 1.2cm length x 0.6cm width x 0.2cm depth; 0.113cm^3 volume. Character of Wound/Ulcer Post Debridement is improved. Post procedure Diagnosis Wound #1: Same as Pre-Procedure Plan Follow-up Appointments: Return Appointment in 2 weeks. - with Dr. Mikey Bussing Other: Bathing/ Shower/ Hygiene: May shower and wash wound with soap and water. WOUND #1: - Achilles Wound Laterality: Right, Lateral Cleanser: Soap and Water 1 x Per Day/15 Days Discharge Instructions: May shower and wash wound with dial antibacterial soap  and water prior to dressing change. Peri-Wound Care: Skin Prep 1 x Per Day/15 Days Discharge Instructions: Use skin prep as directed Prim Dressing: Hydrofera Blue Classic Foam, 2x2 in 1 x Per Day/15 Days ary Discharge Instructions: Moisten with saline prior to applying to wound bed Prim Dressing: Santyl Ointment 1 x Per Day/15 Days ary Discharge Instructions: Apply nickel thick amount to wound bed as instructed Secondary Dressing: Zetuvit Plus Silicone Border Dressing 4x4 (in/in) (DME) (Generic) 1 x Per Day/15 Days Discharge Instructions: Apply silicone border over  primary dressing as directed. 1. In office sharp debridement 2. Santyl with Hydrofera Blue with daily dressing changes 3. Follow-up in 1 week Electronic Signature(s) Signed: 02/28/2021 12:56:53 PM By: Geralyn Corwin DO Entered By: Geralyn Corwin on 02/28/2021 12:56:06 -------------------------------------------------------------------------------- HxROS Details Patient Name: Date of Service: [name]. 02/28/2021 10:15 A M Medical Record Number: [id number] Patient Account Number: [id number] Date of Birth/Sex: Treating RN: 1933-04-05 (87 y.o. [name] Primary Care Provider: Shirlean [name] Other Clinician: Referring Provider: Treating Provider/Extender: [name] in Treatment: 4 Information Obtained From Patient Eyes Medical History: Positive for: Cataracts - bil removed Negative for: Glaucoma; Optic Neuritis Ear/Nose/Mouth/Throat Medical History: Negative for: Chronic sinus problems/congestion; Middle ear problems Past Medical History Notes: extremely hard of hearing Cardiovascular Medical History: Positive for: Hypertension Musculoskeletal Medical History: Positive for: Osteoarthritis Neurologic Medical History: Past Medical History Notes: hx cervical fractures Oncologic Medical History: Negative for: Received Chemotherapy; Received Radiation Psychiatric Medical History: Negative for: Anorexia/bulimia; Confinement Anxiety HBO Extended History Items Eyes: Cataracts Immunizations Pneumococcal Vaccine: Received Pneumococcal Vaccination: Yes Received Pneumococcal Vaccination On or After 60th Birthday: Yes Implantable Devices None Hospitalization / Surgery History Type of Hospitalization/Surgery bil rotator cuff surgeries Family and Social History Cancer: Yes - Siblings; Diabetes: Yes - Siblings; Heart Disease: No; Hereditary Spherocytosis: No; Hypertension: No; Kidney Disease: Yes - Siblings; Lung Disease: No;  Seizures: No; Stroke: No; Thyroid Problems: No; Tuberculosis: No; Former smoker - quit age 20; Marital Status - Married; Alcohol Use: Never; Drug Use: No History; Caffeine Use: Daily - coffee; Financial Concerns: No; Food, Clothing or Shelter Needs: No; Support System Lacking: No; Transportation Concerns: No Electronic Signature(s) Signed: 02/28/2021 12:56:53 PM By: Geralyn Corwin DO Signed: 03/05/2021 5:23:14 PM By: Fonnie Mu RN Entered By: Geralyn Corwin on 02/28/2021 11:47:41 -------------------------------------------------------------------------------- SuperBill Details Patient Name: Date of Service: [name] 02/28/2021 Medical Record Number: [id number] Patient Account Number: [id number] Date of Birth/Sex: Treating RN: 1933-04-13 (87 y.o. [name] Primary Care Provider: Shirlean [name] Other Clinician: Referring Provider: Treating Provider/Extender: [name] in Treatment: 4 Diagnosis Coding ICD-10 Codes Code Description I10 Essential (primary) hypertension S91.301D Unspecified open wound, right foot, subsequent encounter Facility Procedures CPT4 Code: [id number] Description: 11042 - DEB SUBQ TISSUE 20 SQ CM/< ICD-10 Diagnosis Description S91.301D Unspecified open wound, right foot, subsequent encounter Modifier: Quantity: 1 Physician Procedures : CPT4 Code Description Modifier [id number] 11042 - WC PHYS SUBQ TISS 20 SQ CM 1 ICD-10 Diagnosis Description S91.301D Unspecified open wound, right foot, subsequent encounter Quantity: Electronic Signature(s) Signed: 02/28/2021 12:56:53 PM By: Geralyn Corwin DO Entered By: Geralyn Corwin on 02/28/2021 12:56:17

## 2021-03-07 ENCOUNTER — Other Ambulatory Visit: Payer: Self-pay

## 2021-03-07 ENCOUNTER — Encounter (HOSPITAL_BASED_OUTPATIENT_CLINIC_OR_DEPARTMENT_OTHER): Payer: Medicare Other | Admitting: Internal Medicine

## 2021-03-07 DIAGNOSIS — S91301A Unspecified open wound, right foot, initial encounter: Secondary | ICD-10-CM | POA: Diagnosis not present

## 2021-03-07 DIAGNOSIS — I10 Essential (primary) hypertension: Secondary | ICD-10-CM | POA: Diagnosis not present

## 2021-03-07 DIAGNOSIS — G629 Polyneuropathy, unspecified: Secondary | ICD-10-CM | POA: Diagnosis not present

## 2021-03-07 DIAGNOSIS — S91301D Unspecified open wound, right foot, subsequent encounter: Secondary | ICD-10-CM | POA: Diagnosis not present

## 2021-03-07 DIAGNOSIS — Z87891 Personal history of nicotine dependence: Secondary | ICD-10-CM | POA: Diagnosis not present

## 2021-03-08 ENCOUNTER — Encounter (HOSPITAL_BASED_OUTPATIENT_CLINIC_OR_DEPARTMENT_OTHER): Payer: Medicare Other | Admitting: Internal Medicine

## 2021-03-08 DIAGNOSIS — S91009A Unspecified open wound, unspecified ankle, initial encounter: Secondary | ICD-10-CM | POA: Diagnosis not present

## 2021-03-08 NOTE — Progress Notes (Signed)
JARED, WHORLEY ([id number]) Visit Report for 03/07/2021 Arrival Information Details Patient Name: Date of Service: [name], [name] 03/07/2021 10:15 A M Medical Record Number: [id number] Patient Account Number: [id number] Date of Birth/Sex: Treating RN: 29-May-1932 (87 y.o. Charlean Merl, Lauren Primary Care Patsy Varma: Shirlean Mylar Other Clinician: Referring Shekera Beavers: Treating Charie Pinkus/Extender: Donato Heinz in Treatment: 5 Visit Information History Since Last Visit Added or deleted any medications: No Patient Arrived: Ambulatory Any new allergies or adverse reactions: No Arrival Time: 11:00 Had a fall or experienced change in No Accompanied By: wife activities of daily living that may affect Transfer Assistance: None risk of falls: Patient Identification Verified: Yes Signs or symptoms of abuse/neglect since last visito No Secondary Verification Process Completed: Yes Hospitalized since last visit: No Patient Requires Transmission-Based Precautions: No Implantable device outside of the clinic excluding No Patient Has Alerts: Yes cellular tissue based products placed in the center Patient Alerts: R ABI=1.12, L ABI=1.13 since last visit: Has Dressing in Place as Prescribed: Yes Pain Present Now: Yes Electronic Signature(s) Signed: 03/08/2021 12:25:45 PM By: Fonnie Mu RN Entered By: Fonnie Mu on 03/07/2021 11:01:19 -------------------------------------------------------------------------------- Encounter Discharge Information Details Patient Name: Date of Service: [name]. 03/07/2021 10:15 A M Medical Record Number: [id number] Patient Account Number: [id number] Date of Birth/Sex: Treating RN: February 03, 1933 (87 y.o. [name] Primary Care Eviana Sibilia: Shirlean Mylar Other Clinician: Referring Juris Gosnell: Treating Beverly Ferner/Extender: Donato Heinz in Treatment: 5 Encounter Discharge Information Items Post Procedure  Vitals Discharge Condition: Stable Temperature (F): 98.7 Ambulatory Status: Ambulatory Pulse (bpm): 83 Discharge Destination: Home Respiratory Rate (breaths/min): 17 Transportation: Other Blood Pressure (mmHg): 175/97 Accompanied By: wife Schedule Follow-up Appointment: Yes Clinical Summary of Care: Provided on 03/07/2021 Form Type Recipient Paper Patient Patient Electronic Signature(s) Signed: 03/07/2021 11:34:30 AM By: Antonieta Iba Entered By: Antonieta Iba on 03/07/2021 11:34:30 -------------------------------------------------------------------------------- Lower Extremity Assessment Details Patient Name: Date of Service: [name], [name] 03/07/2021 10:15 A M Medical Record Number: [id number] Patient Account Number: [id number] Date of Birth/Sex: Treating RN: 14-Mar-1933 (87 y.o. Charlean Merl, Lauren Primary Care Montgomery Rothlisberger: Shirlean Mylar Other Clinician: Referring Malorie Bigford: Treating Yochanan Eddleman/Extender: Donato Heinz in Treatment: 5 Edema Assessment Assessed: Kyra Searles: No] [Right: Yes] Edema: [Left: N] [Right: o] Calf Left: Right: Point of Measurement: From Medial Instep 30 cm Ankle Left: Right: Point of Measurement: From Medial Instep 18.7 cm Vascular Assessment Pulses: Dorsalis Pedis Palpable: [Right:Yes] Posterior Tibial Palpable: [Right:Yes] Electronic Signature(s) Signed: 03/08/2021 12:25:45 PM By: Fonnie Mu RN Entered By: Fonnie Mu on 03/07/2021 10:45:30 -------------------------------------------------------------------------------- Multi Wound Chart Details Patient Name: Date of Service: [name]. 03/07/2021 10:15 A M Medical Record Number: [id number] Patient Account Number: [id number] Date of Birth/Sex: Treating RN: 1932-09-04 (87 y.o. Charlean Merl, Lauren Primary Care Starling Christofferson: Shirlean Mylar Other Clinician: Referring Ashley Montminy: Treating Kyros Salzwedel/Extender: Donato Heinz in Treatment:  5 Vital Signs Height(in): 60 Pulse(bpm): 83 Weight(lbs): 122 Blood Pressure(mmHg): 175/97 Body Mass Index(BMI): 24 Temperature(F): 98.7 Respiratory Rate(breaths/min): 17 Photos: [1:No Photos Right, Lateral Achilles] [N/A:N/A N/A] Wound Location: [1:Gradually Appeared] [N/A:N/A] Wounding Event: [1:Atypical] [N/A:N/A] Primary Etiology: [1:Cataracts, Hypertension,] [N/A:N/A] Comorbid History: [1:Osteoarthritis 05/16/2020] [N/A:N/A] Date Acquired: [1:0] [N/A:N/A] Weeks of Treatment: [1:Open] [N/A:N/A] Wound Status: [1:1.2x1.3x0.4] [N/A:N/A] Measurements L x W x D (cm) [1:1.225] [N/A:N/A] A (cm) : rea [1:0.49] [N/A:N/A] Volume (cm) : [1:-9.10%] [N/A:N/A] % Reduction in Area: [1:-337.50%] [N/A:N/A] % Reduction in Volume: [1:Full Thickness Without Exposed] [N/A:N/A] Classification: [1:Support Structures Medium] [N/A:N/A] Exudate A mount: [1:Serous] [  N/A:N/A] Exudate Type: [1:amber] [N/A:N/A] Exudate Color: [1:Distinct, outline attached] [N/A:N/A] Wound Margin: [1:Medium (34-66%)] [N/A:N/A] Granulation A mount: [1:Pink] [N/A:N/A] Granulation Quality: [1:Medium (34-66%)] [N/A:N/A] Necrotic A mount: [1:Fat Layer (Subcutaneous Tissue): Yes N/A] Exposed Structures: [1:Fascia: No Tendon: No Muscle: No Joint: No Bone: No Small (1-33%)] [N/A:N/A] Epithelialization: [1:Debridement - Excisional] [N/A:N/A] Debridement: Pre-procedure Verification/Time Out 11:12 [N/A:N/A] Taken: [1:Lidocaine] [N/A:N/A] Pain Control: [1:Subcutaneous, Slough] [N/A:N/A] Tissue Debrided: [1:Skin/Subcutaneous Tissue] [N/A:N/A] Level: [1:1.56] [N/A:N/A] Debridement A (sq cm): [1:rea Curette] [N/A:N/A] Instrument: [1:Minimum] [N/A:N/A] Bleeding: [1:Pressure] [N/A:N/A] Hemostasis A chieved: [1:0] [N/A:N/A] Procedural Pain: [1:0] [N/A:N/A] Post Procedural Pain: [1:Procedure was tolerated well] [N/A:N/A] Debridement Treatment Response: [1:1.2x1.3x0.4] [N/A:N/A] Post Debridement Measurements L x W x D (cm)  [1:0.49] [N/A:N/A] Post Debridement Volume: (cm) [1:Debridement] [N/A:N/A] Treatment Notes Wound #1 (Achilles) Wound Laterality: Right, Lateral Cleanser Soap and Water Discharge Instruction: May shower and wash wound with dial antibacterial soap and water prior to dressing change. Peri-Wound Care Skin Prep Discharge Instruction: Use skin prep as directed Topical Primary Dressing IODOFLEX 0.9% Cadexomer Iodine Pad 4x6 cm Discharge Instruction: Apply to wound bed as instructed Secondary Dressing Zetuvit Plus Silicone Border Dressing 4x4 (in/in) Discharge Instruction: Apply silicone border over primary dressing as directed. Secured With Compression Wrap Compression Stockings Add-Ons Electronic Signature(s) Signed: 03/07/2021 12:24:47 PM By: Geralyn Corwin DO Signed: 03/08/2021 12:25:45 PM By: Fonnie Mu RN Entered By: Geralyn Corwin on 03/07/2021 12:19:13 -------------------------------------------------------------------------------- Multi-Disciplinary Care Plan Details Patient Name: Date of Service: [name]. 03/07/2021 10:15 A M Medical Record Number: [id number] Patient Account Number: [id number] Date of Birth/Sex: Treating RN: [birth year] (87 y.o. [name] Primary Care Lazlo Tunney: Shirlean Mylar Other Clinician: Referring Marco Raper: Treating Tasia Liz/Extender: Donato Heinz in Treatment: 5 Multidisciplinary Care Plan reviewed with physician Active Inactive Wound/Skin Impairment Nursing Diagnoses: Impaired tissue integrity Knowledge deficit related to ulceration/compromised skin integrity Goals: Patient/caregiver will verbalize understanding of skin care regimen Date Initiated: 01/28/2021 Target Resolution Date: 04/11/2021 Goal Status: Active Interventions: Assess patient/caregiver ability to obtain necessary supplies Assess patient/caregiver ability to perform ulcer/skin care regimen upon admission and as needed Assess  ulceration(s) every visit Provide education on ulcer and skin care Notes: 03/07/21: Patient/Caregiver performing dressing changes, new treatment this week. Electronic Signature(s) Signed: 03/07/2021 11:25:01 AM By: Antonieta Iba Entered By: Antonieta Iba on 03/07/2021 11:25:01 -------------------------------------------------------------------------------- Pain Assessment Details Patient Name: Date of Service: [name], [name] 03/07/2021 10:15 A M Medical Record Number: [id number] Patient Account Number: [id number] Date of Birth/Sex: Treating RN: 1932-12-09 (87 y.o. Charlean Merl, Lauren Primary Care Henok Heacock: Shirlean Mylar Other Clinician: Referring Damarko Stitely: Treating Nicolet Griffy/Extender: Donato Heinz in Treatment: 5 Active Problems Location of Pain Severity and Description of Pain Patient Has Paino Yes Site Locations Pain Location: Pain Location: Pain in Ulcers With Dressing Change: Yes Duration of the Pain. Constant / Intermittento Constant Rate the pain. Current Pain Level: 3 Worst Pain Level: 10 Least Pain Level: 0 Tolerable Pain Level: 3 Character of Pain Describe the Pain: Aching Pain Management and Medication Current Pain Management: Medication: No Cold Application: No Rest: No Massage: No Activity: No T.E.N.S.: No Heat Application: No Leg drop or elevation: No Is the Current Pain Management Adequate: Adequate How does your wound impact your activities of daily livingo Sleep: No Bathing: No Appetite: No Relationship With Others: No Bladder Continence: No Emotions: No Bowel Continence: No Work: No Toileting: No Drive: No Dressing: No Hobbies: No Electronic Signature(s) Signed: 03/08/2021 12:25:45 PM By: Fonnie Mu RN Entered By: Fonnie Mu on  03/07/2021 10:43:46 -------------------------------------------------------------------------------- Patient/Caregiver Education Details Patient Name: Date of  Service: [name], [name] 10/27/2022andnbsp10:15 A M Medical Record Number: [id number] Patient Account Number: [id number] Date of Birth/Gender: Treating RN: 05-14-1932 (87 y.o. [name] Primary Care Physician: Shirlean Mylar Other Clinician: Referring Physician: Treating Physician/Extender: Donato Heinz in Treatment: 5 Education Assessment Education Provided To: Patient Education Topics Provided Wound/Skin Impairment: Methods: Demonstration, Explain/Verbal, Printed Responses: State content correctly Electronic Signature(s) Signed: 03/07/2021 4:35:44 PM By: Antonieta Iba Entered By: Antonieta Iba on 03/07/2021 11:32:05 -------------------------------------------------------------------------------- Wound Assessment Details Patient Name: Date of Service: [name] 03/07/2021 10:15 A M Medical Record Number: [id number] Patient Account Number: [id number] Date of Birth/Sex: Treating RN: May 30, 1932 (87 y.o. Charlean Merl, Lauren Primary Care Althia Egolf: Shirlean Mylar Other Clinician: Referring Emilyn Ruble: Treating Rox Mcgriff/Extender: Donato Heinz in Treatment: 5 Wound Status Wound Number: 1 Primary Etiology: Atypical Wound Location: Right, Lateral Achilles Wound Status: Open Wounding Event: Gradually Appeared Comorbid History: Cataracts, Hypertension, Osteoarthritis Date Acquired: 05/16/2020 Weeks Of Treatment: 0 Clustered Wound: No Wound Measurements Length: (cm) 1.2 Width: (cm) 1.3 Depth: (cm) 0.4 Area: (cm) 1.225 Volume: (cm) 0.49 % Reduction in Area: -9.1% % Reduction in Volume: -337.5% Epithelialization: Small (1-33%) Tunneling: No Undermining: No Wound Description Classification: Full Thickness Without Exposed Support Structures Wound Margin: Distinct, outline attached Exudate Amount: Medium Exudate Type: Serous Exudate Color: amber Foul Odor After Cleansing: No Slough/Fibrino Yes Wound Bed Granulation  Amount: Medium (34-66%) Exposed Structure Granulation Quality: Pink Fascia Exposed: No Necrotic Amount: Medium (34-66%) Fat Layer (Subcutaneous Tissue) Exposed: Yes Tendon Exposed: No Muscle Exposed: No Joint Exposed: No Bone Exposed: No Treatment Notes Wound #1 (Achilles) Wound Laterality: Right, Lateral Cleanser Soap and Water Discharge Instruction: May shower and wash wound with dial antibacterial soap and water prior to dressing change. Peri-Wound Care Skin Prep Discharge Instruction: Use skin prep as directed Topical Primary Dressing IODOFLEX 0.9% Cadexomer Iodine Pad 4x6 cm Discharge Instruction: Apply to wound bed as instructed Secondary Dressing Zetuvit Plus Silicone Border Dressing 4x4 (in/in) Discharge Instruction: Apply silicone border over primary dressing as directed. Secured With Compression Wrap Compression Stockings Facilities manager) Signed: 03/08/2021 12:25:45 PM By: Fonnie Mu RN Entered By: Fonnie Mu on 03/07/2021 10:59:38 -------------------------------------------------------------------------------- Vitals Details Patient Name: Date of Service: [name]. 03/07/2021 10:15 A M Medical Record Number: [id number] Patient Account Number: [id number] Date of Birth/Sex: Treating RN: [birth year] (87 y.o. Charlean Merl, Lauren Primary Care Kishana Battey: Shirlean Mylar Other Clinician: Referring Evelia Waskey: Treating Zarin Knupp/Extender: Donato Heinz in Treatment: 5 Vital Signs Time Taken: 10:42 Temperature (F): 98.7 Height (in): 60 Pulse (bpm): 83 Weight (lbs): 122 Respiratory Rate (breaths/min): 17 Body Mass Index (BMI): 23.8 Blood Pressure (mmHg): 175/97 Reference Range: 80 - 120 mg / dl Electronic Signature(s) Signed: 03/08/2021 12:25:45 PM By: Fonnie Mu RN Entered By: Fonnie Mu on 03/07/2021 10:43:03

## 2021-03-08 NOTE — Progress Notes (Signed)
[name], [name] ([id number]) Visit Report for 03/07/2021 Chief Complaint Document Details Patient Name: Date of Service: [name], [name] 03/07/2021 10:15 A M Medical Record Number: [id number] Patient Account Number: [id number] Date of Birth/Sex: Treating RN: 1933-03-05 (87 y.o. [name] Primary Care Provider: Shirlean [name] Other Clinician: Referring Provider: Treating Provider/Extender: [name] in Treatment: 5 Information Obtained from: Patient Chief Complaint Right foot wound Electronic Signature(s) Signed: 03/07/2021 12:24:47 PM By: [name] Corwin DO Entered By: [name] on 03/07/2021 12:19:23 -------------------------------------------------------------------------------- Debridement Details Patient Name: Date of Service: [name] 03/07/2021 10:15 A M Medical Record Number: [id number] Patient Account Number: [id number] Date of Birth/Sex: Treating RN: [birth year] (87 y.o. [name] Primary Care Provider: Shirlean [name] Other Clinician: Referring Provider: Treating Provider/Extender: [name] in Treatment: 5 Debridement Performed for Assessment: Wound #1 Right,Lateral Achilles Performed By: Physician [name] Corwin, DO Debridement Type: Debridement Level of Consciousness (Pre-procedure): Awake and Alert Pre-procedure Verification/Time Out Yes - 11:12 Taken: Start Time: 11:12 Pain Control: Lidocaine T Area Debrided (L x W): otal 1.2 (cm) x 1.3 (cm) = 1.56 (cm) Tissue and other material debrided: Viable, Non-Viable, Slough, Subcutaneous, Skin: Dermis , Skin: Epidermis, Slough Level: Skin/Subcutaneous Tissue Debridement Description: Excisional Instrument: Curette Bleeding: Minimum Hemostasis Achieved: Pressure End Time: 11:12 Procedural Pain: 0 Post Procedural Pain: 0 Response to Treatment: Procedure was tolerated well Level of Consciousness (Post- Awake and Alert procedure): Post  Debridement Measurements of Total Wound Length: (cm) 1.2 Width: (cm) 1.3 Depth: (cm) 0.4 Volume: (cm) 0.49 Character of Wound/Ulcer Post Debridement: Improved Post Procedure Diagnosis Same as Pre-procedure Electronic Signature(s) Signed: 03/07/2021 12:24:47 PM By: [name] Corwin DO Signed: 03/08/2021 12:25:45 PM By: [name] Mu RN Entered By: [name] on 03/07/2021 11:13:47 -------------------------------------------------------------------------------- HPI Details Patient Name: Date of Service: [name]. 03/07/2021 10:15 A M Medical Record Number: [id number] Patient Account Number: [id number] Date of Birth/Sex: Treating RN: 01-19-1933 (87 y.o. [name] Primary Care Provider: Shirlean [name] Other Clinician: Referring Provider: Treating Provider/Extender: [name] in Treatment: 5 History of Present Illness HPI Description: Admission 01/28/2021 [name] is an 87 year old male with a past medical history of essential hypertension that presents to the clinic for a 38-month history of nonhealing wound to his right ankle. He states that he thinks the wound started From his shoes rubbing the back of his ankle. He switched shoes however the wound never healed. He is currently been keeping the area covered and applying antibiotic ointment. He denies signs of infection. 9/27; patient presents for follow-up. He has been using Santyl on the wound bed daily. He has no issues or complaints today. He denies signs of infection. 10/6; patient presents for follow-up. He has been using Santyl on the wound bed daily. He continues to have chronic pain to this area. He denies signs of infection. 10/20; patient presents for follow-up. He has been using Santyl to the wound beds daily. He has no issues or complaints today. He denies signs of infection. 10/27; patient presents for follow-up. He has been using Santyl and Hydrofera Blue to the wound  beds. He denies signs of infection. Electronic Signature(s) Signed: 03/07/2021 12:24:47 PM By: [name] Corwin DO Entered By: [name] on 03/07/2021 12:20:10 -------------------------------------------------------------------------------- Physical Exam Details Patient Name: Date of Service: [name] 03/07/2021 10:15 A M Medical Record Number: [id number] Patient Account Number: [id number] Date of Birth/Sex: Treating RN: [birth year] (87 y.o. [name] Primary Care  Provider: Shirlean [name] Other Clinician: Referring Provider: Treating Provider/Extender: [name] in Treatment: 5 Constitutional respirations regular, non-labored and within target range for patient.. Cardiovascular 2+ dorsalis pedis/posterior tibialis pulses. Psychiatric pleasant and cooperative. Notes Right posterior ankle: Open wound with granulation tissue, slough and tightly adhered nonviable tissue. No evidence of active infection. Electronic Signature(s) Signed: 03/07/2021 12:24:47 PM By: [name] Corwin DO Entered By: [name] on 03/07/2021 12:20:54 -------------------------------------------------------------------------------- Physician Orders Details Patient Name: Date of Service: [name]. 03/07/2021 10:15 A M Medical Record Number: [id number] Patient Account Number: [id number] Date of Birth/Sex: Treating RN: May 14, 1932 (87 y.o. [name] Primary Care Provider: Shirlean [name] Other Clinician: Referring Provider: Treating Provider/Extender: [name] in Treatment: 5 Verbal / Phone Orders: No Diagnosis Coding Follow-up Appointments ppointment in 1 week. - Dr. Mikey [name] Return A Bathing/ Shower/ Hygiene May shower and wash wound with soap and water. Off-Loading Other: - keep pressure off of right lateral achilles Wound Treatment Wound #1 - Achilles Wound Laterality: Right, Lateral Cleanser: Soap and Water  Every Other Day/15 Days Discharge Instructions: May shower and wash wound with dial antibacterial soap and water prior to dressing change. Peri-Wound Care: Skin Prep (DME) (Generic) Every Other Day/15 Days Discharge Instructions: Use skin prep as directed Prim Dressing: IODOFLEX 0.9% Cadexomer Iodine Pad 4x6 cm (DME) (Generic) Every Other Day/15 Days ary Discharge Instructions: Apply to wound bed as instructed Secondary Dressing: Zetuvit Plus Silicone Border Dressing 4x4 (in/in) (DME) (Generic) Every Other Day/15 Days Discharge Instructions: Apply silicone border over primary dressing as directed. Electronic Signature(s) Signed: 03/07/2021 12:24:47 PM By: [name] Corwin DO Entered By: [name] on 03/07/2021 12:21:09 -------------------------------------------------------------------------------- Problem List Details Patient Name: Date of Service: [name]. 03/07/2021 10:15 A M Medical Record Number: [id number] Patient Account Number: [id number] Date of Birth/Sex: Treating RN: 10-29-1932 (87 y.o. [name] Primary Care Provider: Shirlean [name] Other Clinician: Referring Provider: Treating Provider/Extender: [name] in Treatment: 5 Active Problems ICD-10 Encounter Encounter Code Description Active Date MDM Diagnosis S91.301D Unspecified open wound, right foot, subsequent encounter 02/05/2021 No Yes I10 Essential (primary) hypertension 01/28/2021 No Yes Inactive Problems ICD-10 Code Description Active Date Inactive Date S91.301A Unspecified open wound, right foot, initial encounter 01/28/2021 01/29/2021 Resolved Problems Electronic Signature(s) Signed: 03/07/2021 12:24:47 PM By: [name] Corwin DO Entered By: [name] on 03/07/2021 12:19:07 -------------------------------------------------------------------------------- Progress Note Details Patient Name: Date of Service: [name] 03/07/2021 10:15 A M Medical  Record Number: [id number] Patient Account Number: [id number] Date of Birth/Sex: Treating RN: 02/01/1933 (87 y.o. [name] Primary Care Provider: Shirlean [name] Other Clinician: Referring Provider: Treating Provider/Extender: [name] in Treatment: 5 Subjective Chief Complaint Information obtained from Patient Right foot wound History of Present Illness (HPI) Admission 01/28/2021 [name] is an 87 year old male with a past medical history of essential hypertension that presents to the clinic for a 24-month history of nonhealing wound to his right ankle. He states that he thinks the wound started From his shoes rubbing the back of his ankle. He switched shoes however the wound never healed. He is currently been keeping the area covered and applying antibiotic ointment. He denies signs of infection. 9/27; patient presents for follow-up. He has been using Santyl on the wound bed daily. He has no issues or complaints today. He denies signs of infection. 10/6; patient presents for follow-up. He has been using Santyl on the wound bed daily. He continues  to have chronic pain to this area. He denies signs of infection. 10/20; patient presents for follow-up. He has been using Santyl to the wound beds daily. He has no issues or complaints today. He denies signs of infection. 10/27; patient presents for follow-up. He has been using Santyl and Hydrofera Blue to the wound beds. He denies signs of infection. Patient History Information obtained from Patient. Family History Cancer - Siblings, Diabetes - Siblings, Kidney Disease - Siblings, No family history of Heart Disease, Hereditary Spherocytosis, Hypertension, Lung Disease, Seizures, Stroke, Thyroid Problems, Tuberculosis. Social History Former smoker - quit age 24, Marital Status - Married, Alcohol Use - Never, Drug Use - No History, Caffeine Use - Daily - coffee. Medical History Eyes Patient has history of  Cataracts - bil removed Denies history of Glaucoma, Optic Neuritis Ear/Nose/Mouth/Throat Denies history of Chronic sinus problems/congestion, Middle ear problems Cardiovascular Patient has history of Hypertension Musculoskeletal Patient has history of Osteoarthritis Oncologic Denies history of Received Chemotherapy, Received Radiation Psychiatric Denies history of Anorexia/bulimia, Confinement Anxiety Hospitalization/Surgery History - bil rotator cuff surgeries. Medical A Surgical History Notes nd Ear/Nose/Mouth/Throat extremely hard of hearing Neurologic hx cervical fractures Objective Constitutional respirations regular, non-labored and within target range for patient.. Vitals Time Taken: 10:42 AM, Height: 60 in, Weight: 122 lbs, BMI: 23.8, Temperature: 98.7 F, Pulse: 83 bpm, Respiratory Rate: 17 breaths/min, Blood Pressure: 175/97 mmHg. Cardiovascular 2+ dorsalis pedis/posterior tibialis pulses. Psychiatric pleasant and cooperative. General Notes: Right posterior ankle: Open wound with granulation tissue, slough and tightly adhered nonviable tissue. No evidence of active infection. Integumentary (Hair, Skin) Wound #1 status is Open. Original cause of wound was Gradually Appeared. The date acquired was: 05/16/2020. The wound is located on the Right,Lateral Achilles. The wound measures 1.2cm length x 1.3cm width x 0.4cm depth; 1.225cm^2 area and 0.49cm^3 volume. There is Fat Layer (Subcutaneous Tissue) exposed. There is no tunneling or undermining noted. There is a medium amount of serous drainage noted. The wound margin is distinct with the outline attached to the wound base. There is medium (34-66%) pink granulation within the wound bed. There is a medium (34-66%) amount of necrotic tissue within the wound bed. Assessment Active Problems ICD-10 Unspecified open wound, right foot, subsequent encounter Essential (primary) hypertension Patient's wound has shown improvement in  appearance since last clinic visit. I debrided nonviable tissue. There is more granulation tissue present. There are some tightly adhered nonviable tissue. At this time I would like to switch to Iodoflex to help clean this up more. No signs of infection on exam. Follow-up in 1 week. Procedures Wound #1 Pre-procedure diagnosis of Wound #1 is an Atypical located on the Right,Lateral Achilles . There was a Excisional Skin/Subcutaneous Tissue Debridement with a total area of 1.56 sq cm performed by [name] Corwin, DO. With the following instrument(s): Curette to remove Viable and Non-Viable tissue/material. Material removed includes Subcutaneous Tissue, Slough, Skin: Dermis, and Skin: Epidermis after achieving pain control using Lidocaine. No specimens were taken. A time out was conducted at 11:12, prior to the start of the procedure. A Minimum amount of bleeding was controlled with Pressure. The procedure was tolerated well with a pain level of 0 throughout and a pain level of 0 following the procedure. Post Debridement Measurements: 1.2cm length x 1.3cm width x 0.4cm depth; 0.49cm^3 volume. Character of Wound/Ulcer Post Debridement is improved. Post procedure Diagnosis Wound #1: Same as Pre-Procedure Plan Follow-up Appointments: Return Appointment in 1 week. - Dr. Mikey [name] Bathing/ Shower/ Hygiene: May shower and wash wound  with soap and water. Off-Loading: Other: - keep pressure off of right lateral achilles WOUND #1: - Achilles Wound Laterality: Right, Lateral Cleanser: Soap and Water Every Other Day/15 Days Discharge Instructions: May shower and wash wound with dial antibacterial soap and water prior to dressing change. Peri-Wound Care: Skin Prep (DME) (Generic) Every Other Day/15 Days Discharge Instructions: Use skin prep as directed Prim Dressing: IODOFLEX 0.9% Cadexomer Iodine Pad 4x6 cm (DME) (Generic) Every Other Day/15 Days ary Discharge Instructions: Apply to wound bed as  instructed Secondary Dressing: Zetuvit Plus Silicone Border Dressing 4x4 (in/in) (DME) (Generic) Every Other Day/15 Days Discharge Instructions: Apply silicone border over primary dressing as directed. 1. In office sharp debridement 2. Offloading pad 3. Iodoflex 4. Follow-up in 1 week Electronic Signature(s) Signed: 03/07/2021 12:24:47 PM By: [name] Corwin DO Entered By: [name] on 03/07/2021 12:24:14 -------------------------------------------------------------------------------- HxROS Details Patient Name: Date of Service: [name]. 03/07/2021 10:15 A M Medical Record Number: [id number] Patient Account Number: [id number] Date of Birth/Sex: Treating RN: [birth year] (87 y.o. [name] Primary Care Provider: Shirlean [name] Other Clinician: Referring Provider: Treating Provider/Extender: [name] in Treatment: 5 Information Obtained From Patient Eyes Medical History: Positive for: Cataracts - bil removed Negative for: Glaucoma; Optic Neuritis Ear/Nose/Mouth/Throat Medical History: Negative for: Chronic sinus problems/congestion; Middle ear problems Past Medical History Notes: extremely hard of hearing Cardiovascular Medical History: Positive for: Hypertension Musculoskeletal Medical History: Positive for: Osteoarthritis Neurologic Medical History: Past Medical History Notes: hx cervical fractures Oncologic Medical History: Negative for: Received Chemotherapy; Received Radiation Psychiatric Medical History: Negative for: Anorexia/bulimia; Confinement Anxiety HBO Extended History Items Eyes: Cataracts Immunizations Pneumococcal Vaccine: Received Pneumococcal Vaccination: Yes Received Pneumococcal Vaccination On or After 60th Birthday: Yes Implantable Devices None Hospitalization / Surgery History Type of Hospitalization/Surgery bil rotator cuff surgeries Family and Social History Cancer: Yes - Siblings;  Diabetes: Yes - Siblings; Heart Disease: No; Hereditary Spherocytosis: No; Hypertension: No; Kidney Disease: Yes - Siblings; Lung Disease: No; Seizures: No; Stroke: No; Thyroid Problems: No; Tuberculosis: No; Former smoker - quit age 31; Marital Status - Married; Alcohol Use: Never; Drug Use: No History; Caffeine Use: Daily - coffee; Financial Concerns: No; Food, Clothing or Shelter Needs: No; Support System Lacking: No; Transportation Concerns: No Electronic Signature(s) Signed: 03/07/2021 12:24:47 PM By: [name] Corwin DO Signed: 03/08/2021 12:25:45 PM By: [name] Mu RN Entered By: [name] on 03/07/2021 12:20:16 -------------------------------------------------------------------------------- SuperBill Details Patient Name: Date of Service: [name] 03/07/2021 Medical Record Number: [id number] Patient Account Number: [id number] Date of Birth/Sex: Treating RN: 10-26-1932 (87 y.o. [name] Primary Care Provider: Shirlean [name] Other Clinician: Referring Provider: Treating Provider/Extender: [name] in Treatment: 5 Diagnosis Coding ICD-10 Codes Code Description I10 Essential (primary) hypertension S91.301D Unspecified open wound, right foot, subsequent encounter Facility Procedures CPT4 Code: [id number] Description: 11042 - DEB SUBQ TISSUE 20 SQ CM/< ICD-10 Diagnosis Description S91.301D Unspecified open wound, right foot, subsequent encounter Modifier: Quantity: 1 Physician Procedures : CPT4 Code Description Modifier [id number] 11042 - WC PHYS SUBQ TISS 20 SQ CM ICD-10 Diagnosis Description S91.301D Unspecified open wound, right foot, subsequent encounter Quantity: 1 Electronic Signature(s) Signed: 03/07/2021 12:24:47 PM By: [name] Corwin DO Previous Signature: 03/07/2021 11:33:22 AM Version By: Antonieta Iba Entered By: [name] on 03/07/2021 12:24:23

## 2021-03-14 ENCOUNTER — Encounter (HOSPITAL_BASED_OUTPATIENT_CLINIC_OR_DEPARTMENT_OTHER): Payer: Medicare Other | Attending: Internal Medicine | Admitting: Internal Medicine

## 2021-03-14 ENCOUNTER — Other Ambulatory Visit: Payer: Self-pay

## 2021-03-14 DIAGNOSIS — X58XXXD Exposure to other specified factors, subsequent encounter: Secondary | ICD-10-CM | POA: Insufficient documentation

## 2021-03-14 DIAGNOSIS — I10 Essential (primary) hypertension: Secondary | ICD-10-CM

## 2021-03-14 DIAGNOSIS — Z87891 Personal history of nicotine dependence: Secondary | ICD-10-CM | POA: Insufficient documentation

## 2021-03-14 DIAGNOSIS — L89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, stage 3: Secondary | ICD-10-CM

## 2021-03-14 DIAGNOSIS — G8929 Other chronic pain: Secondary | ICD-10-CM | POA: Diagnosis not present

## 2021-03-14 DIAGNOSIS — I83023 Varicose veins of left lower extremity with ulcer of ankle: Secondary | ICD-10-CM | POA: Insufficient documentation

## 2021-03-14 DIAGNOSIS — S91301D Unspecified open wound, right foot, subsequent encounter: Secondary | ICD-10-CM

## 2021-03-19 NOTE — Progress Notes (Signed)
[name], [name] ([id number]) Visit Report for 03/14/2021 Chief Complaint Document Details Patient Name: Date of Service: [name], [name] 03/14/2021 1:00 PM Medical Record Number: [id number] Patient Account Number: [id number] Date of Birth/Sex: Treating RN: 09/04/1932 (87 y.o. [name], [name] Primary Care Provider: Shirlean Mylar Other Clinician: Referring Provider: Treating Provider/Extender: Donato Heinz in Treatment: 6 Information Obtained from: Patient Chief Complaint Right foot wound Electronic Signature(s) Signed: 03/14/2021 2:41:37 PM By: Geralyn Corwin DO Entered By: Geralyn Corwin on 03/14/2021 14:33:20 -------------------------------------------------------------------------------- Debridement Details Patient Name: Date of Service: [name]. 03/14/2021 1:00 PM Medical Record Number: [id number] Patient Account Number: [id number] Date of Birth/Sex: Treating RN: [birth year] (87 y.o. [name] Primary Care Provider: Shirlean Mylar Other Clinician: Referring Provider: Treating Provider/Extender: Donato Heinz in Treatment: 6 Debridement Performed for Assessment: Wound #1 Right,Lateral Achilles Performed By: Physician Geralyn Corwin, DO Debridement Type: Debridement Level of Consciousness (Pre-procedure): Awake and Alert Pre-procedure Verification/Time Out Yes - 13:43 Taken: Start Time: 13:44 Pain Control: Lidocaine 5% topical ointment T Area Debrided (L x W): otal 1.2 (cm) x 0.8 (cm) = 0.96 (cm) Tissue and other material debrided: Non-Viable, Slough, Subcutaneous, Slough Level: Skin/Subcutaneous Tissue Debridement Description: Excisional Instrument: Curette Bleeding: Minimum Hemostasis Achieved: Pressure End Time: 13:47 Response to Treatment: Procedure was tolerated well Level of Consciousness (Post- Awake and Alert procedure): Post Debridement Measurements of Total Wound Length: (cm) 1.2 Width: (cm)  0.8 Depth: (cm) 0.3 Volume: (cm) 0.226 Character of Wound/Ulcer Post Debridement: Stable Post Procedure Diagnosis Same as Pre-procedure Electronic Signature(s) Signed: 03/14/2021 2:41:37 PM By: Geralyn Corwin DO Signed: 03/14/2021 6:31:34 PM By: Antonieta Iba Entered By: Antonieta Iba on 03/14/2021 13:48:02 -------------------------------------------------------------------------------- HPI Details Patient Name: Date of Service: [name]. 03/14/2021 1:00 PM Medical Record Number: [id number] Patient Account Number: [id number] Date of Birth/Sex: Treating RN: 1932-07-17 (87 y.o. [name] Primary Care Provider: Shirlean Mylar Other Clinician: Referring Provider: Treating Provider/Extender: Donato Heinz in Treatment: 6 History of Present Illness HPI Description: Admission 01/28/2021 [name] is an 86 year old male with a past medical history of essential hypertension that presents to the clinic for a 60-month history of nonhealing wound to his right ankle. He states that he thinks the wound started From his shoes rubbing the back of his ankle. He switched shoes however the wound never healed. He is currently been keeping the area covered and applying antibiotic ointment. He denies signs of infection. 9/27; patient presents for follow-up. He has been using Santyl on the wound bed daily. He has no issues or complaints today. He denies signs of infection. 10/6; patient presents for follow-up. He has been using Santyl on the wound bed daily. He continues to have chronic pain to this area. He denies signs of infection. 10/20; patient presents for follow-up. He has been using Santyl to the wound beds daily. He has no issues or complaints today. He denies signs of infection. 10/27; patient presents for follow-up. He has been using Santyl and Hydrofera Blue to the wound beds. He denies signs of infection. 11/3; patient presents for follow-up. He has been  using Iodoflex without issues. He denies signs of infection. Electronic Signature(s) Signed: 03/14/2021 2:41:37 PM By: Geralyn Corwin DO Entered By: Geralyn Corwin on 03/14/2021 14:33:56 -------------------------------------------------------------------------------- Physical Exam Details Patient Name: Date of Service: [name] 03/14/2021 1:00 PM Medical Record Number: [id number] Patient Account Number: [id number] Date of Birth/Sex: Treating RN: [birth year] (87 y.o. [name], [name]  Primary Care Provider: Shirlean Mylar Other Clinician: Referring Provider: Treating Provider/Extender: Donato Heinz in Treatment: 6 Constitutional respirations regular, non-labored and within target range for patient.. Cardiovascular 2+ dorsalis pedis/posterior tibialis pulses. Psychiatric pleasant and cooperative. Notes Right posterior ankle: Open wound with granulation tissue, slough and tightly adhered nonviable tissue. No evidence of active infection. Electronic Signature(s) Signed: 03/14/2021 2:41:37 PM By: Geralyn Corwin DO Entered By: Geralyn Corwin on 03/14/2021 14:35:29 -------------------------------------------------------------------------------- Physician Orders Details Patient Name: Date of Service: [name]. 03/14/2021 1:00 PM Medical Record Number: [id number] Patient Account Number: [id number] Date of Birth/Sex: Treating RN: [birth year] (87 y.o. 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 - Dr. Mikey Bussing Return A Cellular or Tissue Based  Products Cellular or Tissue Based Product Type: - Run IVR for Organogenesis Products (Apligraf) and EpiFix Bathing/ Shower/ Hygiene May shower and wash wound with soap and water. Off-Loading Other: - keep pressure off of right lateral achilles Wound Treatment Wound #1 - Achilles Wound Laterality: Right, Lateral Cleanser: Soap and Water Every Other Day/15 Days Discharge Instructions: May shower and wash wound with dial antibacterial soap and water prior to dressing change. Peri-Wound Care: Skin Prep (Generic) Every Other Day/15 Days Discharge Instructions: Use skin prep as directed Prim Dressing: IODOFLEX 0.9% Cadexomer Iodine Pad 4x6 cm (Generic) Every Other Day/15 Days ary Discharge Instructions: Apply to wound bed as instructed Secondary Dressing: Zetuvit Plus Silicone Border Dressing 4x4 (in/in) (Generic) Every Other Day/15 Days Discharge Instructions: Apply silicone border over primary dressing as directed. Electronic Signature(s) Signed: 03/14/2021 2:41:37 PM By: Geralyn Corwin DO Entered By: Geralyn Corwin on 03/14/2021 14:36:03 -------------------------------------------------------------------------------- Problem List Details Patient Name: Date of Service: [name]. 03/14/2021 1:00 PM Medical Record Number: [id number] Patient Account Number: [id number] Date of Birth/Sex: Treating RN: 04/13/1933 (87 y.o. 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, stage 3 03/14/2021 No Yes I10 Essential (primary) hypertension 01/28/2021 No Yes S91.301D Unspecified open wound, right foot, subsequent encounter 02/05/2021 No Yes Inactive Problems ICD-10 Code Description Active Date Inactive Date S91.301A Unspecified open wound, right  foot, initial encounter 01/28/2021 01/29/2021 Resolved Problems Electronic Signature(s) Signed: 03/14/2021 2:41:37 PM By: Geralyn Corwin DO Entered By: Geralyn Corwin on 03/14/2021 14:22:01 -------------------------------------------------------------------------------- Progress Note Details Patient Name: Date of Service: [name]. 03/14/2021 1:00 PM Medical Record Number: [id number] Patient Account Number: [id number] Date of Birth/Sex: Treating RN: 11-20-1932 (87 y.o. [name], [name] Primary Care Provider: Shirlean Mylar Other Clinician: Referring Provider: Treating Provider/Extender: Donato Heinz in Treatment: 6 Subjective Chief Complaint Information obtained from Patient Right foot wound History of Present Illness (HPI) Admission 01/28/2021 [name] is an 87 year old male with a past medical history of essential hypertension that presents to the clinic for a 28-month history of nonhealing wound to his right ankle. He states that he thinks the wound started From his shoes rubbing the back of his ankle. He switched shoes however the wound never healed. He is currently been keeping the area covered and applying antibiotic  ointment. He denies signs of infection. 9/27; patient presents for follow-up. He has been using Santyl on the wound bed daily. He has no issues or complaints today. He denies signs of infection. 10/6; patient presents for follow-up. He has been using Santyl on the wound bed daily. He continues to have chronic pain to this area. He denies signs of infection. 10/20; patient presents for follow-up. He has been using Santyl to the wound beds daily. He has no issues or complaints today. He denies signs of infection. 10/27; patient presents for follow-up. He has been using Santyl and Hydrofera Blue to the wound beds. He denies signs of infection. 11/3; patient presents for follow-up. He has been using Iodoflex without issues. He denies signs  of infection. Patient History Information obtained from Patient. Family History Cancer - Siblings, Diabetes - Siblings, Kidney Disease - Siblings, No family history of Heart Disease, Hereditary Spherocytosis, Hypertension, Lung Disease, Seizures, Stroke, Thyroid Problems, Tuberculosis. Social History Former smoker - quit age 52, Marital Status - Married, Alcohol Use - Never, Drug Use - No History, Caffeine Use - Daily - coffee. Medical History Eyes Patient has history of Cataracts - bil removed Denies history of Glaucoma, Optic Neuritis Ear/Nose/Mouth/Throat Denies history of Chronic sinus problems/congestion, Middle ear problems Cardiovascular Patient has history of Hypertension Musculoskeletal Patient has history of Osteoarthritis Oncologic Denies history of Received Chemotherapy, Received Radiation Psychiatric Denies history of Anorexia/bulimia, Confinement Anxiety Hospitalization/Surgery History - bil rotator cuff surgeries. Medical A Surgical History Notes nd Ear/Nose/Mouth/Throat extremely hard of hearing Neurologic hx cervical fractures Objective Constitutional respirations regular, non-labored and within target range for patient.. Vitals Time Taken: 1:27 PM, Height: 60 in, Weight: 122 lbs, BMI: 23.8, Temperature: 98.6 F, Pulse: 80 bpm, Respiratory Rate: 17 breaths/min, Blood Pressure: 152/89 mmHg. Cardiovascular 2+ dorsalis pedis/posterior tibialis pulses. Psychiatric pleasant and cooperative. General Notes: Right posterior ankle: Open wound with granulation tissue, slough and tightly adhered nonviable tissue. No evidence of active infection. Integumentary (Hair, Skin) Wound #1 status is Open. Original cause of wound was Gradually Appeared. The date acquired was: 05/16/2020. The wound has been in treatment 1 weeks. The wound is located on the Right,Lateral Achilles. The wound measures 1.2cm length x 0.8cm width x 0.3cm depth; 0.754cm^2 area and 0.226cm^3 volume.  There is Fat Layer (Subcutaneous Tissue) exposed. There is no tunneling or undermining noted. There is a medium amount of serous drainage noted. The wound margin is distinct with the outline attached to the wound base. There is large (67-100%) pink granulation within the wound bed. There is a small (1-33%) amount of necrotic tissue within the wound bed including Adherent Slough. Assessment Active Problems ICD-10 Pressure ulcer of unspecified ankle, stage 3 Essential (primary) hypertension Unspecified open wound, right foot, subsequent encounter Patient's wound has shown improvement in size and appearance since last clinic visit. I debrided nonviable tissue. No signs of infection on exam. I recommended continuing Iodosorb. We will run organogenesis products and epifix through insurance to see if he qualifies for a skin substitute. I think he would benefit greatly from this. If this is not approved then we will try TheraSkin. Procedures Wound #1 Pre-procedure diagnosis of Wound #1 is an Atypical located on the Right,Lateral Achilles . There was a Excisional Skin/Subcutaneous Tissue Debridement with a total area of 0.96 sq cm performed by Geralyn Corwin, DO. With the following instrument(s): Curette to remove Non-Viable tissue/material. Material removed includes Subcutaneous Tissue and Slough and after achieving pain control using Lidocaine 5% topical ointment. No specimens were  taken. A time out was conducted at 13:43, prior to the start of the procedure. A Minimum amount of bleeding was controlled with Pressure. The procedure was tolerated well. Post Debridement Measurements: 1.2cm length x 0.8cm width x 0.3cm depth; 0.226cm^3 volume. Character of Wound/Ulcer Post Debridement is stable. Post procedure Diagnosis Wound #1: Same as Pre-Procedure Plan Follow-up Appointments: Return Appointment in 2 weeks. - Dr. Mikey Bussing Cellular or Tissue Based Products: Cellular or Tissue Based Product Type: -  Run IVR for Organogenesis Products (Apligraf) and EpiFix Bathing/ Shower/ Hygiene: May shower and wash wound with soap and water. Off-Loading: Other: - keep pressure off of right lateral achilles WOUND #1: - Achilles Wound Laterality: Right, Lateral Cleanser: Soap and Water Every Other Day/15 Days Discharge Instructions: May shower and wash wound with dial antibacterial soap and water prior to dressing change. Peri-Wound Care: Skin Prep (Generic) Every Other Day/15 Days Discharge Instructions: Use skin prep as directed Prim Dressing: IODOFLEX 0.9% Cadexomer Iodine Pad 4x6 cm (Generic) Every Other Day/15 Days ary Discharge Instructions: Apply to wound bed as instructed Secondary Dressing: Zetuvit Plus Silicone Border Dressing 4x4 (in/in) (Generic) Every Other Day/15 Days Discharge Instructions: Apply silicone border over primary dressing as directed. 1. In office sharp debridement 2. Iodosorb 3. Follow-up in 2 weeks 4. Run skin substitute through insurance for approval Electronic Signature(s) Signed: 03/14/2021 2:41:37 PM By: Geralyn Corwin DO Entered By: Geralyn Corwin on 03/14/2021 14:40:46 -------------------------------------------------------------------------------- HxROS Details Patient Name: Date of Service: [name]. 03/14/2021 1:00 PM Medical Record Number: [id number] Patient Account Number: [id number] Date of Birth/Sex: Treating RN: [birth year] (87 y.o. [name], [name] Primary Care Provider: Shirlean Mylar Other Clinician: Referring Provider: Treating Provider/Extender: Donato Heinz in Treatment: 6 Information Obtained From Patient Eyes Medical History: Positive for: Cataracts - bil removed Negative for: Glaucoma; Optic Neuritis Ear/Nose/Mouth/Throat Medical History: Negative for: Chronic sinus problems/congestion; Middle ear problems Past Medical History Notes: extremely hard of hearing Cardiovascular Medical History: Positive  for: Hypertension Musculoskeletal Medical History: Positive for: Osteoarthritis Neurologic Medical History: Past Medical History Notes: hx cervical fractures Oncologic Medical History: Negative for: Received Chemotherapy; Received Radiation Psychiatric Medical History: Negative for: Anorexia/bulimia; Confinement Anxiety HBO Extended History Items Eyes: Cataracts Immunizations Pneumococcal Vaccine: Received Pneumococcal Vaccination: Yes Received Pneumococcal Vaccination On or After 60th Birthday: Yes Implantable Devices None Hospitalization / Surgery History Type of Hospitalization/Surgery bil rotator cuff surgeries Family and Social History Cancer: Yes - Siblings; Diabetes: Yes - Siblings; Heart Disease: No; Hereditary Spherocytosis: No; Hypertension: No; Kidney Disease: Yes - Siblings; Lung Disease: No; Seizures: No; Stroke: No; Thyroid Problems: No; Tuberculosis: No; Former smoker - quit age 59; Marital Status - Married; Alcohol Use: Never; Drug Use: No History; Caffeine Use: Daily - coffee; Financial Concerns: No; Food, Clothing or Shelter Needs: No; Support System Lacking: No; Transportation Concerns: No Electronic Signature(s) Signed: 03/14/2021 2:41:37 PM By: Geralyn Corwin DO Signed: 03/19/2021 4:45:49 PM By: Fonnie Mu RN Entered By: Geralyn Corwin on 03/14/2021 14:34:10 -------------------------------------------------------------------------------- SuperBill Details Patient Name: Date of Service: [name] 03/14/2021 Medical Record Number: [id number] Patient Account Number: [id number] Date of Birth/Sex: Treating RN: 1932/07/28 (87 y.o. 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

## 2021-03-19 NOTE — Progress Notes (Signed)
[name], [name] ([id number]) Visit Report for 03/14/2021 Arrival Information Details Patient Name: Date of Service: [name], [name] 03/14/2021 1:00 PM Medical Record Number: [id number] Patient Account Number: [id number] Date of Birth/Sex: Treating RN: 07/14/1932 (87 y.o. Charlean Merl, Lauren Primary Care Chaquana Nichols: Shirlean Mylar Other Clinician: Referring Tessy Pawelski: Treating Klinton Candelas/Extender: Donato Heinz in Treatment: 6 Visit Information History Since Last Visit Added or deleted any medications: No Patient Arrived: Ambulatory Any new allergies or adverse reactions: No Arrival Time: 13:26 Had a fall or experienced change in No Accompanied By: wife activities of daily living that may affect Transfer Assistance: None risk of falls: Patient Identification Verified: Yes Signs or symptoms of abuse/neglect since last visito No Secondary Verification Process Completed: Yes Hospitalized since last visit: No Patient Requires Transmission-Based Precautions: No Implantable device outside of the clinic excluding No Patient Has Alerts: Yes cellular tissue based products placed in the center Patient Alerts: R ABI=1.12, L ABI=1.13 since last visit: Has Dressing in Place as Prescribed: Yes Pain Present Now: No Electronic Signature(s) Signed: 03/18/2021 3:53:30 PM By: Karl Ito Entered By: Karl Ito on 03/14/2021 13:27:13 -------------------------------------------------------------------------------- Encounter Discharge Information Details Patient Name: Date of Service: [name]. 03/14/2021 1:00 PM Medical Record Number: [id number] Patient Account Number: [id number] Date of Birth/Sex: Treating RN: 1933/01/21 (87 y.o. [name] Primary Care Lain Tetterton: Shirlean Mylar Other Clinician: Referring Andrey Mccaskill: Treating Youssef Footman/Extender: Donato Heinz in Treatment: 6 Encounter Discharge Information Items Post Procedure Vitals Discharge  Condition: Stable Temperature (F): 98.6 Ambulatory Status: Ambulatory Pulse (bpm): 80 Discharge Destination: Home Respiratory Rate (breaths/min): 17 Transportation: Private Auto Blood Pressure (mmHg): 152/89 Schedule Follow-up Appointment: Yes Clinical Summary of Care: Provided on 03/14/2021 Form Type Recipient Paper Patient Patient Electronic Signature(s) Signed: 03/14/2021 6:31:34 PM By: Antonieta Iba Entered By: Antonieta Iba on 03/14/2021 13:51:19 -------------------------------------------------------------------------------- Lower Extremity Assessment Details Patient Name: Date of Service: [name], [name] 03/14/2021 1:00 PM Medical Record Number: [id number] Patient Account Number: [id number] Date of Birth/Sex: Treating RN: 1933/01/05 (87 y.o. [name] Primary Care Courtnee Myer: Shirlean Mylar Other Clinician: Referring Nayah Lukens: Treating Delan Ksiazek/Extender: Donato Heinz in Treatment: 6 Edema Assessment Assessed: [Left: No] [Right: Yes] Edema: [Left: N] [Right: o] Calf Left: Right: Point of Measurement: From Medial Instep 30 cm Ankle Left: Right: Point of Measurement: From Medial Instep 18.7 cm Vascular Assessment Pulses: Dorsalis Pedis Palpable: [Right:Yes] Electronic Signature(s) Signed: 03/14/2021 6:31:34 PM By: Antonieta Iba Entered By: Antonieta Iba on 03/14/2021 13:40:31 -------------------------------------------------------------------------------- Multi Wound Chart Details Patient Name: Date of Service: [name]. 03/14/2021 1:00 PM Medical Record Number: [id number] Patient Account Number: [id number] Date of Birth/Sex: Treating RN: Mar 03, 1933 (87 y.o. Charlean Merl, Lauren Primary Care Madysyn Hanken: Shirlean Mylar Other Clinician: Referring Melis Trochez: Treating Mitzie Marlar/Extender: Donato Heinz in Treatment: 6 Vital Signs Height(in): 60 Pulse(bpm): 80 Weight(lbs): 122 Blood Pressure(mmHg): 152/89 Body  Mass Index(BMI): 24 Temperature(F): 98.6 Respiratory Rate(breaths/min): 17 Photos: [1:Right, Lateral Achilles] [N/A:N/A N/A] Wound Location: [1:Gradually Appeared] [N/A:N/A] Wounding Event: [1:Atypical] [N/A:N/A] Primary Etiology: [1:Cataracts, Hypertension,] [N/A:N/A] Comorbid History: [1:Osteoarthritis 05/16/2020] [N/A:N/A] Date Acquired: [1:1] [N/A:N/A] Weeks of Treatment: [1:Open] [N/A:N/A] Wound Status: [1:1.2x0.8x0.3] [N/A:N/A] Measurements L x W x D (cm) [1:0.754] [N/A:N/A] A (cm) : rea [1:0.226] [N/A:N/A] Volume (cm) : [1:38.40%] [N/A:N/A] % Reduction in A [1:rea: 53.90%] [N/A:N/A] % Reduction in Volume: [1:Full Thickness Without Exposed] [N/A:N/A] Classification: [1:Support Structures Medium] [N/A:N/A] Exudate A mount: [1:Serous] [N/A:N/A] Exudate Type: [1:amber] [N/A:N/A] Exudate Color: [1:Distinct, outline attached] [N/A:N/A] Wound Margin: [  1:Large (67-100%)] [N/A:N/A] Granulation A mount: [1:Pink] [N/A:N/A] Granulation Quality: [1:Small (1-33%)] [N/A:N/A] Necrotic A mount: [1:Fat Layer (Subcutaneous Tissue): Yes N/A] Exposed Structures: [1:Fascia: No Tendon: No Muscle: No Joint: No Bone: No Small (1-33%)] [N/A:N/A] Epithelialization: [1:Debridement - Excisional] [N/A:N/A] Debridement: Pre-procedure Verification/Time Out 13:43 [N/A:N/A] Taken: [1:Lidocaine 5% topical ointment] [N/A:N/A] Pain Control: [1:Subcutaneous, Slough] [N/A:N/A] Tissue Debrided: [1:Skin/Subcutaneous Tissue] [N/A:N/A] Level: [1:0.96] [N/A:N/A] Debridement A (sq cm): [1:rea Curette] [N/A:N/A] Instrument: [1:Minimum] [N/A:N/A] Bleeding: [1:Pressure] [N/A:N/A] Hemostasis A chieved: [1:Procedure was tolerated well] [N/A:N/A] Debridement Treatment Response: [1:1.2x0.8x0.3] [N/A:N/A] Post Debridement Measurements L x W x D (cm) [1:0.226] [N/A:N/A] Post Debridement Volume: (cm) [1:Debridement] [N/A:N/A] Treatment Notes Wound #1 (Achilles) Wound Laterality: Right, Lateral Cleanser Soap and  Water Discharge Instruction: May shower and wash wound with dial antibacterial soap and water prior to dressing change. Peri-Wound Care Skin Prep Discharge Instruction: Use skin prep as directed Topical Primary Dressing IODOFLEX 0.9% Cadexomer Iodine Pad 4x6 cm Discharge Instruction: Apply to wound bed as instructed Secondary Dressing Zetuvit Plus Silicone Border Dressing 4x4 (in/in) Discharge Instruction: Apply silicone border over primary dressing as directed. Secured With Compression Wrap Compression Stockings Facilities manager) Signed: 03/14/2021 2:41:37 PM By: Geralyn Corwin DO Signed: 03/19/2021 4:45:49 PM By: Fonnie Mu RN Entered By: Geralyn Corwin on 03/14/2021 14:22:08 -------------------------------------------------------------------------------- Multi-Disciplinary Care Plan Details Patient Name: Date of Service: [name]. 03/14/2021 1:00 PM Medical Record Number: [id number] Patient Account Number: [id number] Date of Birth/Sex: Treating RN: Dec 09, 1932 (87 y.o. [name] Primary Care Luretha Eberly: Shirlean Mylar Other Clinician: Referring Mertice Uffelman: Treating Atanacio Melnyk/Extender: Donato Heinz in Treatment: 6 Multidisciplinary Care Plan reviewed with physician Active Inactive Wound/Skin Impairment Nursing Diagnoses: Impaired tissue integrity Knowledge deficit related to ulceration/compromised skin integrity Goals: Patient/caregiver will verbalize understanding of skin care regimen Date Initiated: 01/28/2021 Target Resolution Date: 04/11/2021 Goal Status: Active Interventions: Assess patient/caregiver ability to obtain necessary supplies Assess patient/caregiver ability to perform ulcer/skin care regimen upon admission and as needed Assess ulceration(s) every visit Provide education on ulcer and skin care Notes: 03/07/21: Patient/Caregiver performing dressing changes, new treatment this week. Electronic  Signature(s) Signed: 03/14/2021 6:31:34 PM By: Antonieta Iba Entered By: Antonieta Iba on 03/14/2021 13:42:32 -------------------------------------------------------------------------------- Pain Assessment Details Patient Name: Date of Service: SATOSHI, KALAS 03/14/2021 1:00 PM Medical Record Number: [id number] Patient Account Number: [id number] Date of Birth/Sex: Treating RN: 02/21/1933 (87 y.o. Charlean Merl, Lauren Primary Care Hasna Stefanik: Shirlean Mylar Other Clinician: Referring River Mckercher: Treating Cristan Scherzer/Extender: Donato Heinz in Treatment: 6 Active Problems Location of Pain Severity and Description of Pain Patient Has Paino No Site Locations Pain Management and Medication Current Pain Management: Electronic Signature(s) Signed: 03/18/2021 3:53:30 PM By: Karl Ito Signed: 03/19/2021 4:45:49 PM By: Fonnie Mu RN Entered By: Karl Ito on 03/14/2021 13:27:45 -------------------------------------------------------------------------------- Patient/Caregiver Education Details Patient Name: Date of Service: [name] 11/3/2022andnbsp1:00 PM Medical Record Number: [id number] Patient Account Number: [id number] Date of Birth/Gender: Treating RN: 03-28-1933 (87 y.o. [name] Primary Care Physician: Shirlean Mylar Other Clinician: Referring Physician: Treating Physician/Extender: Donato Heinz in Treatment: 6 Education Assessment Education Provided To: Patient Education Topics Provided Wound/Skin Impairment: Methods: Demonstration, Explain/Verbal, Printed Responses: State content correctly Electronic Signature(s) Signed: 03/14/2021 6:31:34 PM By: Antonieta Iba Entered By: Antonieta Iba on 03/14/2021 13:43:27 -------------------------------------------------------------------------------- Wound Assessment Details Patient Name: Date of Service: [name]. 03/14/2021 1:00 PM Medical Record  Number: [id number] Patient Account Number: [id number] Date of Birth/Sex: Treating RN: 24-Sep-1932 (87 y.o. Charlean Merl,  Lauren Primary Care Elsa Ploch: Shirlean Mylar Other Clinician: Referring Arfa Lamarca: Treating Tahirah Sara/Extender: Donato Heinz in Treatment: 6 Wound Status Wound Number: 1 Primary Etiology: Atypical Wound Location: Right, Lateral Achilles Wound Status: Open Wounding Event: Gradually Appeared Comorbid History: Cataracts, Hypertension, Osteoarthritis Date Acquired: 05/16/2020 Weeks Of Treatment: 1 Clustered Wound: No Photos Wound Measurements Length: (cm) 1.2 Width: (cm) 0.8 Depth: (cm) 0.3 Area: (cm) 0.754 Volume: (cm) 0.226 % Reduction in Area: 38.4% % Reduction in Volume: 53.9% Epithelialization: Small (1-33%) Tunneling: No Undermining: No Wound Description Classification: Full Thickness Without Exposed Support Structures Wound Margin: Distinct, outline attached Exudate Amount: Medium Exudate Type: Serous Exudate Color: amber Foul Odor After Cleansing: No Slough/Fibrino Yes Wound Bed Granulation Amount: Large (67-100%) Exposed Structure Granulation Quality: Pink Fascia Exposed: No Necrotic Amount: Small (1-33%) Fat Layer (Subcutaneous Tissue) Exposed: Yes Necrotic Quality: Adherent Slough Tendon Exposed: No Muscle Exposed: No Joint Exposed: No Bone Exposed: No Treatment Notes Wound #1 (Achilles) Wound Laterality: Right, Lateral Cleanser Soap and Water Discharge Instruction: May shower and wash wound with dial antibacterial soap and water prior to dressing change. Peri-Wound Care Skin Prep Discharge Instruction: Use skin prep as directed Topical Primary Dressing IODOFLEX 0.9% Cadexomer Iodine Pad 4x6 cm Discharge Instruction: Apply to wound bed as instructed Secondary Dressing Zetuvit Plus Silicone Border Dressing 4x4 (in/in) Discharge Instruction: Apply silicone border over primary dressing as directed. Secured  With Compression Wrap Compression Stockings Facilities manager) Signed: 03/14/2021 6:31:34 PM By: Antonieta Iba Signed: 03/19/2021 4:45:49 PM By: Fonnie Mu RN Entered By: Antonieta Iba on 03/14/2021 13:41:42 -------------------------------------------------------------------------------- Vitals Details Patient Name: Date of Service: [name]. 03/14/2021 1:00 PM Medical Record Number: [id number] Patient Account Number: [id number] Date of Birth/Sex: Treating RN: Dec 05, 1932 (87 y.o. Charlean Merl, Lauren Primary Care Zeth Buday: Shirlean Mylar Other Clinician: Referring Sante Biedermann: Treating Yaneliz Radebaugh/Extender: Donato Heinz in Treatment: 6 Vital Signs Time Taken: 13:27 Temperature (F): 98.6 Height (in): 60 Pulse (bpm): 80 Weight (lbs): 122 Respiratory Rate (breaths/min): 17 Body Mass Index (BMI): 23.8 Blood Pressure (mmHg): 152/89 Reference Range: 80 - 120 mg / dl Electronic Signature(s) Signed: 03/18/2021 3:53:30 PM By: Karl Ito Entered By: Karl Ito on 03/14/2021 13:27:38

## 2021-03-28 ENCOUNTER — Ambulatory Visit (HOSPITAL_COMMUNITY)
Admission: RE | Admit: 2021-03-28 | Discharge: 2021-03-28 | Disposition: A | Discharge status: Home / Self Care | Payer: Medicare Other | Source: Ambulatory Visit | Attending: Internal Medicine | Admitting: Internal Medicine

## 2021-03-28 ENCOUNTER — Other Ambulatory Visit: Payer: Self-pay

## 2021-03-28 ENCOUNTER — Encounter (HOSPITAL_BASED_OUTPATIENT_CLINIC_OR_DEPARTMENT_OTHER): Payer: Medicare Other | Admitting: Internal Medicine

## 2021-03-28 ENCOUNTER — Other Ambulatory Visit (HOSPITAL_COMMUNITY): Payer: Self-pay | Admitting: Internal Medicine

## 2021-03-28 DIAGNOSIS — I10 Essential (primary) hypertension: Secondary | ICD-10-CM | POA: Diagnosis not present

## 2021-03-28 DIAGNOSIS — S91301D Unspecified open wound, right foot, subsequent encounter: Secondary | ICD-10-CM

## 2021-03-28 DIAGNOSIS — I83023 Varicose veins of left lower extremity with ulcer of ankle: Secondary | ICD-10-CM

## 2021-03-28 DIAGNOSIS — L89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, stage 3: Secondary | ICD-10-CM

## 2021-03-28 DIAGNOSIS — S91001A Unspecified open wound, right ankle, initial encounter: Secondary | ICD-10-CM | POA: Diagnosis not present

## 2021-03-28 NOTE — Progress Notes (Signed)
[name], [name] ([id number]) Visit Report for 03/28/2021 Arrival Information Details Patient Name: Date of Service: [name], [name] 03/28/2021 1:45 PM Medical Record Number: [id number] Patient Account Number: [id number] Date of Birth/Sex: Treating RN: 08-Jan-1933 (87 y.o. Charlean Merl, Lauren Primary Care Maylon Sailors: Shirlean Mylar Other Clinician: Referring Jonothan Heberle: Treating Sheza Strickland/Extender: Donato Heinz in Treatment: 8 Visit Information History Since Last Visit Added or deleted any medications: No Patient Arrived: Ambulatory Any new allergies or adverse reactions: No Arrival Time: 13:40 Had a fall or experienced change in No Accompanied By: wife activities of daily living that may affect Transfer Assistance: None risk of falls: Patient Identification Verified: Yes Signs or symptoms of abuse/neglect since last visito No Secondary Verification Process Completed: Yes Hospitalized since last visit: No Patient Requires Transmission-Based Precautions: No Implantable device outside of the clinic excluding No Patient Has Alerts: Yes cellular tissue based products placed in the center Patient Alerts: R ABI=1.12, L ABI=1.13 since last visit: Has Dressing in Place as Prescribed: Yes Pain Present Now: Yes Electronic Signature(s) Signed: 03/28/2021 4:04:47 PM By: Karl Ito Entered By: Karl Ito on 03/28/2021 13:40:54 -------------------------------------------------------------------------------- Encounter Discharge Information Details Patient Name: Date of Service: [name]. 03/28/2021 1:45 PM Medical Record Number: [id number] Patient Account Number: [id number] Date of Birth/Sex: Treating RN: [birth year] (87 y.o. [name] Primary Care Demitrious Mccannon: Shirlean Mylar Other Clinician: Referring Aaronmichael Brumbaugh: Treating Rayne Loiseau/Extender: Donato Heinz in Treatment: 8 Encounter Discharge Information Items Post Procedure  Vitals Discharge Condition: Stable Temperature (F): 97.8 Ambulatory Status: Ambulatory Pulse (bpm): 81 Discharge Destination: Home Respiratory Rate (breaths/min): 16 Transportation: Private Auto Blood Pressure (mmHg): 151/84 Accompanied By: spouse Schedule Follow-up Appointment: Yes Clinical Summary of Care: Patient Declined Electronic Signature(s) Signed: 03/28/2021 5:23:58 PM By: Zandra Abts RN, BSN Entered By: Zandra Abts on 03/28/2021 17:11:08 -------------------------------------------------------------------------------- Multi Wound Chart Details Patient Name: Date of Service: [name]. 03/28/2021 1:45 PM Medical Record Number: [id number] Patient Account Number: [id number] Date of Birth/Sex: Treating RN: [birth year] (87 y.o. Charlean Merl, Lauren Primary Care Myron Lona: Shirlean Mylar Other Clinician: Referring Foxx Klarich: Treating Coady Train/Extender: Donato Heinz in Treatment: 8 Vital Signs Height(in): 60 Pulse(bpm): 81 Weight(lbs): 122 Blood Pressure(mmHg): 151/84 Body Mass Index(BMI): 24 Temperature(F): 97.8 Respiratory Rate(breaths/min): 17 Photos: [N/A:N/A] Right, Lateral Achilles N/A N/A Wound Location: Gradually Appeared N/A N/A Wounding Event: Atypical N/A N/A Primary Etiology: Cataracts, Hypertension, N/A N/A Comorbid History: Osteoarthritis 05/16/2020 N/A N/A Date Acquired: 3 N/A N/A Weeks of Treatment: Open N/A N/A Wound Status: 1x1.5x0.3 N/A N/A Measurements L x W x D (cm) 1.178 N/A N/A A (cm) : rea 0.353 N/A N/A Volume (cm) : 3.80% N/A N/A % Reduction in A rea: 28.00% N/A N/A % Reduction in Volume: Full Thickness Without Exposed N/A N/A Classification: Support Structures Medium N/A N/A Exudate A mount: Serous N/A N/A Exudate Type: amber N/A N/A Exudate Color: Distinct, outline attached N/A N/A Wound Margin: Small (1-33%) N/A N/A Granulation A mount: Pink N/A N/A Granulation Quality: Large  (67-100%) N/A N/A Necrotic A mount: Fat Layer (Subcutaneous Tissue): Yes N/A N/A Exposed Structures: Fascia: No Tendon: No Muscle: No Joint: No Bone: No Small (1-33%) N/A N/A Epithelialization: Debridement - Excisional N/A N/A Debridement: Pre-procedure Verification/Time Out 14:00 N/A N/A Taken: Subcutaneous, Slough N/A N/A Tissue Debrided: Skin/Subcutaneous Tissue N/A N/A Level: 1.5 N/A N/A Debridement A (sq cm): rea Curette N/A N/A Instrument: Minimum N/A N/A Bleeding: Pressure N/A N/A Hemostasis A chieved: 0 N/A N/A Procedural Pain: 0 N/A N/A  Post Procedural Pain: Procedure was tolerated well N/A N/A Debridement Treatment Response: 1x1.2x0.3 N/A N/A Post Debridement Measurements L x W x D (cm) 0.283 N/A N/A Post Debridement Volume: (cm) Debridement N/A N/A Procedures Performed: Treatment Notes Electronic Signature(s) Signed: 03/28/2021 2:17:29 PM By: Geralyn Corwin DO Signed: 03/28/2021 5:03:29 PM By: Fonnie Mu RN Entered By: Geralyn Corwin on 03/28/2021 14:13:17 -------------------------------------------------------------------------------- Multi-Disciplinary Care Plan Details Patient Name: Date of Service: [name]. 03/28/2021 1:45 PM Medical Record Number: [id number] Patient Account Number: [id number] Date of Birth/Sex: Treating RN: 09-01-1932 (87 y.o. [name] Primary Care Ceasar Decandia: Shirlean Mylar Other Clinician: Referring Delanie Tirrell: Treating Rollins Wrightson/Extender: Donato Heinz in Treatment: 8 Multidisciplinary Care Plan reviewed with physician Active Inactive Wound/Skin Impairment Nursing Diagnoses: Impaired tissue integrity Knowledge deficit related to ulceration/compromised skin integrity Goals: Patient/caregiver will verbalize understanding of skin care regimen Date Initiated: 01/28/2021 Target Resolution Date: 04/11/2021 Goal Status: Active Interventions: Assess patient/caregiver ability to  obtain necessary supplies Assess patient/caregiver ability to perform ulcer/skin care regimen upon admission and as needed Assess ulceration(s) every visit Provide education on ulcer and skin care Notes: 03/07/21: Patient/Caregiver performing dressing changes, new treatment this week. Electronic Signature(s) Signed: 03/28/2021 5:23:58 PM By: Zandra Abts RN, BSN Entered By: Zandra Abts on 03/28/2021 14:41:49 -------------------------------------------------------------------------------- Pain Assessment Details Patient Name: Date of Service: [name] 03/28/2021 1:45 PM Medical Record Number: [id number] Patient Account Number: [id number] Date of Birth/Sex: Treating RN: 09-19-1932 (87 y.o. Charlean Merl, Lauren Primary Care Isham Smitherman: Shirlean Mylar Other Clinician: Referring Sarenity Ramaker: Treating Jemari Hallum/Extender: Donato Heinz in Treatment: 8 Active Problems Location of Pain Severity and Description of Pain Patient Has Paino Yes Patient Has Paino Yes Site Locations Rate the pain. Current Pain Level: 5 Pain Management and Medication Current Pain Management: Electronic Signature(s) Signed: 03/28/2021 4:04:47 PM By: Karl Ito Signed: 03/28/2021 5:03:29 PM By: Fonnie Mu RN Entered By: Karl Ito on 03/28/2021 13:43:53 -------------------------------------------------------------------------------- Patient/Caregiver Education Details Patient Name: Date of Service: [name] 11/17/2022andnbsp1:45 PM Medical Record Number: [id number] Patient Account Number: [id number] Date of Birth/Gender: Treating RN: [birth year] (87 y.o. [name] Primary Care Physician: Shirlean Mylar Other Clinician: Referring Physician: Treating Physician/Extender: Donato Heinz in Treatment: 8 Education Assessment Education Provided To: Patient Education Topics Provided Wound/Skin Impairment: Methods:  Explain/Verbal Responses: State content correctly Electronic Signature(s) Signed: 03/28/2021 5:23:58 PM By: Zandra Abts RN, BSN Entered By: Zandra Abts on 03/28/2021 14:44:32 -------------------------------------------------------------------------------- Wound Assessment Details Patient Name: Date of Service: [name] 03/28/2021 1:45 PM Medical Record Number: [id number] Patient Account Number: [id number] Date of Birth/Sex: Treating RN: Aug 18, 1932 (87 y.o. [name] Primary Care Nayzeth Altman: Other Clinician: Shirlean Mylar Referring Isaih Bulger: Treating Kailoni Vahle/Extender: Donato Heinz in Treatment: 8 Wound Status Wound Number: 1 Primary Etiology: Atypical Wound Location: Right, Lateral Achilles Wound Status: Open Wounding Event: Gradually Appeared Comorbid History: Cataracts, Hypertension, Osteoarthritis Date Acquired: 05/16/2020 Weeks Of Treatment: 3 Clustered Wound: No Photos Wound Measurements Length: (cm) 1 Width: (cm) 1.5 Depth: (cm) 0.3 Area: (cm) 1.178 Volume: (cm) 0.353 % Reduction in Area: 3.8% % Reduction in Volume: 28% Epithelialization: Small (1-33%) Tunneling: No Undermining: No Wound Description Classification: Full Thickness Without Exposed Support Structures Wound Margin: Distinct, outline attached Exudate Amount: Medium Exudate Type: Serous Exudate Color: amber Foul Odor After Cleansing: No Slough/Fibrino Yes Wound Bed Granulation Amount: Small (1-33%) Exposed Structure Granulation Quality: Pink Fascia Exposed: No Necrotic Amount: Large (67-100%) Fat Layer (Subcutaneous Tissue) Exposed: Yes Necrotic  Quality: Adherent Slough Tendon Exposed: No Muscle Exposed: No Joint Exposed: No Bone Exposed: No Treatment Notes Wound #1 (Achilles) Wound Laterality: Right, Lateral Cleanser Soap and Water Discharge Instruction: May shower and wash wound with dial antibacterial soap and water prior to dressing  change. Peri-Wound Care Skin Prep Discharge Instruction: Use skin prep as directed Topical Primary Dressing Hydrofera Blue Ready Foam, 2.5 x2.5 in Discharge Instruction: Apply Hydrofera over Santyl Santyl Ointment Discharge Instruction: Apply nickel thick amount to wound bed as instructed Secondary Dressing Zetuvit Plus Silicone Border Dressing 4x4 (in/in) Discharge Instruction: Apply silicone border over primary dressing as directed. Secured With Compression Wrap Compression Stockings Facilities manager) Signed: 03/28/2021 5:03:41 PM By: Antonieta Iba Signed: 03/28/2021 5:23:58 PM By: Zandra Abts RN, BSN Entered By: Zandra Abts on 03/28/2021 13:58:22 -------------------------------------------------------------------------------- Vitals Details Patient Name: Date of Service: [name]. 03/28/2021 1:45 PM Medical Record Number: [id number] Patient Account Number: [id number] Date of Birth/Sex: Treating RN: May 07, 1933 (87 y.o. Charlean Merl, Lauren Primary Care Shanoah Asbill: Shirlean Mylar Other Clinician: Referring Aldean Suddeth: Treating Faustino Luecke/Extender: Donato Heinz in Treatment: 8 Vital Signs Time Taken: 13:41 Temperature (F): 97.8 Height (in): 60 Pulse (bpm): 81 Weight (lbs): 122 Respiratory Rate (breaths/min): 17 Body Mass Index (BMI): 23.8 Blood Pressure (mmHg): 151/84 Reference Range: 80 - 120 mg / dl Electronic Signature(s) Signed: 03/28/2021 4:04:47 PM By: Karl Ito Entered By: Karl Ito on 03/28/2021 13:43:43

## 2021-03-28 NOTE — Progress Notes (Signed)
[name], [name] ([id number]) Visit Report for 03/28/2021 Chief Complaint Document Details Patient Name: Date of Service: [name], [name] 03/28/2021 1:45 PM Medical Record Number: [id number] Patient Account Number: [id number] Date of Birth/Sex: Treating RN: [birth year] (87 y.o. Charlean Merl, Lauren Primary Care Provider: Shirlean Mylar Other Clinician: Referring Provider: Treating Provider/Extender: Donato Heinz in Treatment: 8 Information Obtained from: Patient Chief Complaint Right foot wound Electronic Signature(s) Signed: 03/28/2021 2:17:29 PM By: Geralyn Corwin DO Entered By: Geralyn Corwin on 03/28/2021 14:13:26 -------------------------------------------------------------------------------- Debridement Details Patient Name: Date of Service: [name] 03/28/2021 1:45 PM Medical Record Number: [id number] Patient Account Number: [id number] Date of Birth/Sex: Treating RN: 1932-08-16 (87 y.o. Elizebeth Koller Primary Care Provider: Shirlean Mylar Other Clinician: Referring Provider: Treating Provider/Extender: Donato Heinz in Treatment: 8 Debridement Performed for Assessment: Wound #1 Right,Lateral Achilles Performed By: Physician Geralyn Corwin, DO Debridement Type: Debridement Level of Consciousness (Pre-procedure): Awake and Alert Pre-procedure Verification/Time Out Yes - 14:00 Taken: Start Time: 14:00 T Area Debrided (L x W): otal 1 (cm) x 1.5 (cm) = 1.5 (cm) Tissue and other material debrided: Viable, Non-Viable, Slough, Subcutaneous, Slough Level: Skin/Subcutaneous Tissue Debridement Description: Excisional Instrument: Curette Bleeding: Minimum Hemostasis Achieved: Pressure End Time: 14:02 Procedural Pain: 0 Post Procedural Pain: 0 Response to Treatment: Procedure was tolerated well Level of Consciousness (Post- Awake and Alert procedure): Post Debridement Measurements of Total Wound Length: (cm) 1 Width: (cm)  1.2 Depth: (cm) 0.3 Volume: (cm) 0.283 Character of Wound/Ulcer Post Debridement: Requires Further Debridement Post Procedure Diagnosis Same as Pre-procedure Electronic Signature(s) Signed: 03/28/2021 2:17:29 PM By: Geralyn Corwin DO Signed: 03/28/2021 5:23:58 PM By: Zandra Abts RN, BSN Entered By: Zandra Abts on 03/28/2021 14:03:14 -------------------------------------------------------------------------------- HPI Details Patient Name: Date of Service: [name]. 03/28/2021 1:45 PM Medical Record Number: [id number] Patient Account Number: [id number] Date of Birth/Sex: Treating RN: 25-May-1932 (87 y.o. [name] Primary Care Provider: Shirlean Mylar Other Clinician: Referring Provider: Treating Provider/Extender: Donato Heinz in Treatment: 8 History of Present Illness HPI Description: Admission 01/28/2021 [name] is an 87 year old male with a past medical history of essential hypertension that presents to the clinic for a 70-month history of nonhealing wound to his right ankle. He states that he thinks the wound started From his shoes rubbing the back of his ankle. He switched shoes however the wound never healed. He is currently been keeping the area covered and applying antibiotic ointment. He denies signs of infection. 9/27; patient presents for follow-up. He has been using Santyl on the wound bed daily. He has no issues or complaints today. He denies signs of infection. 10/6; patient presents for follow-up. He has been using Santyl on the wound bed daily. He continues to have chronic pain to this area. He denies signs of infection. 10/20; patient presents for follow-up. He has been using Santyl to the wound beds daily. He has no issues or complaints today. He denies signs of infection. 10/27; patient presents for follow-up. He has been using Santyl and Hydrofera Blue to the wound beds. He denies signs of infection. 11/3; patient  presents for follow-up. He has been using Iodoflex without issues. He denies signs of infection. 11/17; patient presents for follow-up. He continues to use Iodoflex without any issues. He currently denies signs of infection. He still reports chronic pain to the area. Electronic Signature(s) Signed: 03/28/2021 2:17:29 PM By: Geralyn Corwin DO Entered By: Geralyn Corwin on 03/28/2021 14:13:45 -------------------------------------------------------------------------------- Physical  Exam Details Patient Name: Date of Service: [name], [name] 03/28/2021 1:45 PM Medical Record Number: [id number] Patient Account Number: [id number] Date of Birth/Sex: Treating RN: [birth year] (87 y.o. [name] Primary Care Provider: Shirlean Mylar Other Clinician: Referring Provider: Treating Provider/Extender: Donato Heinz in Treatment: 8 Constitutional respirations regular, non-labored and within target range for patient.. Cardiovascular 2+ dorsalis pedis/posterior tibialis pulses. Psychiatric pleasant and cooperative. Notes Right posterior ankle: Open wound with granulation tissue, slough and fibrinous tissue. No evidence of active infection. Electronic Signature(s) Signed: 03/28/2021 2:17:29 PM By: Geralyn Corwin DO Entered By: Geralyn Corwin on 03/28/2021 14:14:47 -------------------------------------------------------------------------------- Physician Orders Details Patient Name: Date of Service: [name]. 03/28/2021 1:45 PM Medical Record Number: [id number] Patient Account Number: [id number] Date of Birth/Sex: Treating RN: [birth year] (87 y.o. 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 - Dr. Mikey Bussing Return A Bathing/ Shower/ Hygiene May shower and wash wound with soap and water. Off-Loading Other: - keep pressure off of right lateral achilles Wound Treatment Wound #1 - Achilles Wound Laterality: Right, Lateral Cleanser: Soap and Water Every Other Day/15 Days Discharge Instructions: May shower and wash wound with dial antibacterial soap and water prior to dressing change. Peri-Wound Care: Skin Prep (Generic) Every Other Day/15 Days Discharge Instructions: Use skin prep as directed Prim Dressing: Hydrofera Blue Ready Foam, 2.5 x2.5 in Every Other Day/15 Days ary Discharge Instructions: Apply Hydrofera over Santyl Prim Dressing: Santyl Ointment Every Other Day/15 Days ary Discharge Instructions: Apply nickel thick amount to wound bed as instructed Secondary Dressing: Zetuvit Plus Silicone Border Dressing 4x4 (in/in) (Generic) Every Other Day/15 Days Discharge Instructions: Apply silicone border over primary dressing as directed. Radiology X-ray, right ankle - Non healing wound on right achilles - (ICD10 L89.503 - Pressure ulcer of unspecified ankle, stage 3) Electronic Signature(s) Signed: 03/28/2021 4:03:46 PM By: Geralyn Corwin DO Signed: 03/28/2021 5:23:58 PM By: Zandra Abts RN, BSN Previous Signature: 03/28/2021 2:17:29 PM Version By: Geralyn Corwin DO Entered By: Zandra Abts on 03/28/2021 15:12:00 Prescription 03/28/2021 -------------------------------------------------------------------------------- Jobe Marker. Geralyn Corwin DO Patient Name: Provider: Dec 09, 1932 [id number] Date of Birth: NPI#: Judie Petit [id number] Sex: DEA #: [phone] Phone #: License #: Eligha Bridegroom [facility] Patient Address: [address] [place], [place] [address] [address] [place], [place] [address] [phone] Allergies prednisone Provider's Orders X-ray, right ankle - ICD10: L89.503 - Non healing wound on right achilles Hand Signature: Date(s): Electronic Signature(s) Signed: 03/28/2021 4:03:46 PM By: Geralyn Corwin DO Signed: 03/28/2021 5:23:58 PM By: Zandra Abts RN, BSN Previous Signature: 03/28/2021 2:17:29 PM Version By: Geralyn Corwin DO Entered By: Zandra Abts on 03/28/2021 15:12:00 -------------------------------------------------------------------------------- Problem List Details Patient Name: Date of Service: JAHAAD, PENADO 03/28/2021 1:45 PM Medical Record Number: [id number] Patient Account Number: [id number] Date of Birth/Sex: Treating RN: Feb 09, 1933 (87 y.o. 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, stage 3 03/14/2021 No Yes S91.301D Unspecified open wound, right foot, subsequent encounter 02/05/2021 No Yes I10 Essential (primary) hypertension 01/28/2021 No Yes Inactive Problems ICD-10 Code Description Active Date Inactive Date S91.301A Unspecified open wound, right foot, initial encounter 01/28/2021 01/29/2021 Resolved Problems Electronic Signature(s) Signed: 03/28/2021 2:17:29 PM By: Geralyn Corwin DO Entered By: Geralyn Corwin on 03/28/2021 14:13:11 -------------------------------------------------------------------------------- Progress Note Details Patient Name: Date of Service: [name]. 03/28/2021 1:45 PM Medical Record Number: [id number] Patient Account Number: [id number] Date of Birth/Sex: Treating  RN: [birth year] (87 y.o. Charlean Merl, Lauren Primary Care Provider: Shirlean Mylar Other Clinician: Referring Provider: Treating Provider/Extender: Donato Heinz in Treatment: 8 Subjective Chief Complaint Information obtained from Patient Right foot wound History of Present Illness (HPI) Admission 01/28/2021 Mr. Crawford Tamura is an 87 year old male with a past medical history of essential hypertension that presents to the clinic for a 80-month history of nonhealing wound to his right ankle. He states that he thinks the wound started From his shoes rubbing the back of his ankle. He switched shoes however the wound never healed. He is currently been keeping the area covered and applying antibiotic ointment. He denies signs of infection. 9/27; patient presents for follow-up. He has been using Santyl on the wound bed daily. He has no issues or complaints today. He denies signs of infection. 10/6; patient presents for follow-up. He has been using Santyl on the wound bed daily. He continues to have chronic pain to this area. He denies signs of infection. 10/20; patient presents for follow-up. He has been using Santyl to the wound beds daily. He has no issues or complaints today. He denies signs of infection. 10/27; patient presents for follow-up. He has been using Santyl and Hydrofera Blue to the wound beds. He denies signs of infection. 11/3; patient presents for follow-up. He has been using Iodoflex without issues. He denies signs of infection. 11/17; patient presents for follow-up. He continues to use Iodoflex without any issues. He currently denies signs of infection. He still reports chronic pain to the area. Patient History Information obtained from Patient. Family History Cancer - Siblings, Diabetes - Siblings, Kidney Disease - Siblings, No family history of Heart Disease, Hereditary Spherocytosis, Hypertension, Lung Disease, Seizures, Stroke, Thyroid Problems,  Tuberculosis. Social History Former smoker - quit age 61, Marital Status - Married, Alcohol Use - Never, Drug Use - No History, Caffeine Use - Daily - coffee. Medical History Eyes Patient has history of Cataracts - bil removed Denies history of Glaucoma, Optic Neuritis Ear/Nose/Mouth/Throat Denies history of Chronic sinus problems/congestion, Middle ear problems Cardiovascular Patient has history of Hypertension Musculoskeletal Patient has history of Osteoarthritis Oncologic Denies history of Received Chemotherapy, Received Radiation Psychiatric Denies history of Anorexia/bulimia, Confinement Anxiety Hospitalization/Surgery History - bil rotator cuff surgeries. Medical A Surgical History Notes nd Ear/Nose/Mouth/Throat extremely hard of hearing Neurologic hx cervical fractures Objective Constitutional respirations regular, non-labored and within target range for patient.. Vitals Time Taken: 1:41 PM, Height: 60 in, Weight: 122 lbs, BMI: 23.8, Temperature: 97.8 F, Pulse: 81 bpm, Respiratory Rate: 17 breaths/min, Blood Pressure: 151/84 mmHg. Cardiovascular 2+ dorsalis pedis/posterior tibialis pulses. Psychiatric pleasant and cooperative. General Notes: Right posterior ankle: Open wound with granulation tissue, slough and fibrinous tissue. No evidence of active infection. Integumentary (Hair, Skin) Wound #1 status is Open. Original cause of wound was Gradually Appeared. The date acquired was: 05/16/2020. The wound has been in treatment 3 weeks. The wound is located on  the Right,Lateral Achilles. The wound measures 1cm length x 1.5cm width x 0.3cm depth; 1.178cm^2 area and 0.353cm^3 volume. There is Fat Layer (Subcutaneous Tissue) exposed. There is no tunneling or undermining noted. There is a medium amount of serous drainage noted. The wound margin is distinct with the outline attached to the wound base. There is small (1-33%) pink granulation within the wound bed. There is a large  (67-100%) amount of necrotic tissue within the wound bed including Adherent Slough. 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 I debrided nonviable tissue. No signs of infection. I think he can stop using Iodoflex at this time and switch back to [employer] with a small amount of Santyl. Due to continued chronic pain I recommended getting an x-ray of the right ankle. Procedures Wound #1 Pre-procedure diagnosis of Wound #1 is an Atypical located on the Right,Lateral Achilles . There was a Excisional Skin/Subcutaneous Tissue Debridement with a total area of 1.5 sq cm performed by Geralyn Corwin, DO. With the following instrument(s): Curette to remove Viable and Non-Viable tissue/material. Material removed includes Subcutaneous Tissue and Slough and. No specimens were taken. A time out was conducted at 14:00, prior to the start of the procedure. A Minimum amount of bleeding was controlled with Pressure. The procedure was tolerated well with a pain level of 0 throughout and a pain level of 0 following the procedure. Post Debridement Measurements: 1cm length x 1.2cm width x 0.3cm depth; 0.283cm^3 volume. Character of Wound/Ulcer Post Debridement requires further debridement. Post procedure Diagnosis Wound #1: Same as Pre-Procedure Plan Follow-up Appointments: Return Appointment in 2 weeks. - Dr. Mikey Bussing Bathing/ Shower/ Hygiene: May shower and wash wound with soap and water. Off-Loading: Other: - keep pressure off of right lateral achilles Radiology ordered were: X-ray, right heel/achilles - Non healing wound on right achilles WOUND #1: - Achilles Wound Laterality: Right, Lateral Cleanser: Soap and Water Every Other Day/15 Days Discharge  Instructions: May shower and wash wound with dial antibacterial soap and water prior to dressing change. Peri-Wound Care: Skin Prep (Generic) Every Other Day/15 Days Discharge Instructions: Use skin prep as directed Prim Dressing: Hydrofera Blue Ready Foam, 2.5 x2.5 in Every Other Day/15 Days ary Discharge Instructions: Apply Hydrofera over Santyl Prim Dressing: Santyl Ointment Every Other Day/15 Days ary Discharge Instructions: Apply nickel thick amount to wound bed as instructed Secondary Dressing: Zetuvit Plus Silicone Border Dressing 4x4 (in/in) (Generic) Every Other Day/15 Days Discharge Instructions: Apply silicone border over primary dressing as directed. 1. In office sharp debridement 2. Hydrofera Blue and Santyl 3. Follow-up in 1 to 2 weeks Electronic Signature(s) Signed: 03/28/2021 2:17:29 PM By: Geralyn Corwin DO Entered By: Geralyn Corwin on 03/28/2021 14:16:54 -------------------------------------------------------------------------------- HxROS Details Patient Name: Date of Service: [name]. 03/28/2021 1:45 PM Medical Record Number: [id number] Patient Account Number: [id number] Date of Birth/Sex: Treating RN: May 31, 1932 (87 y.o. Charlean Merl, Lauren Primary Care Provider: Shirlean Mylar Other Clinician: Referring Provider: Treating Provider/Extender: Donato Heinz in Treatment: 8 Information Obtained From Patient Eyes Medical History: Positive for: Cataracts - bil removed Negative for: Glaucoma; Optic Neuritis Ear/Nose/Mouth/Throat Medical History: Negative for: Chronic sinus problems/congestion; Middle ear problems Past Medical History Notes: extremely hard of hearing Cardiovascular Medical History: Positive for: Hypertension Musculoskeletal Medical History: Positive for: Osteoarthritis Neurologic Medical History: Past Medical  History Notes: hx cervical fractures Oncologic Medical History: Negative for: Received  Chemotherapy; Received Radiation Psychiatric Medical History: Negative for: Anorexia/bulimia; Confinement Anxiety HBO Extended History Items Eyes: Cataracts Immunizations Pneumococcal Vaccine: Received Pneumococcal Vaccination: Yes Received Pneumococcal Vaccination On or After 60th Birthday: Yes Implantable Devices None Hospitalization / Surgery History Type of Hospitalization/Surgery bil rotator cuff surgeries Family and Social History Cancer: Yes - Siblings; Diabetes: Yes - Siblings; Heart Disease: No; Hereditary Spherocytosis: No; Hypertension: No; Kidney Disease: Yes - Siblings; Lung Disease: No; Seizures: No; Stroke: No; Thyroid Problems: No; Tuberculosis: No; Former smoker - quit age 38; Marital Status - Married; Alcohol Use: Never; Drug Use: No History; Caffeine Use: Daily - coffee; Financial Concerns: No; Food, Clothing or Shelter Needs: No; Support System Lacking: No; Transportation Concerns: No Electronic Signature(s) Signed: 03/28/2021 2:17:29 PM By: Geralyn Corwin DO Signed: 03/28/2021 5:03:29 PM By: Fonnie Mu RN Entered By: Geralyn Corwin on 03/28/2021 14:13:51 -------------------------------------------------------------------------------- SuperBill Details Patient Name: Date of Service: [name] 03/28/2021 Medical Record Number: [id number] Patient Account Number: [id number] Date of Birth/Sex: Treating RN: 24-Nov-1932 (87 y.o. 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 L89.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, stage 3 S91.301D Unspecified open wound, right foot, subsequent  encounter I10 Essential (primary) hypertension Modifier: Quantity: 1 Physician Procedures : CPT4 Code Description Modifier [id number] 11042 - WC PHYS SUBQ TISS 20 SQ CM ICD-10 Diagnosis Description I83.023 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

## 2021-04-11 ENCOUNTER — Encounter (HOSPITAL_BASED_OUTPATIENT_CLINIC_OR_DEPARTMENT_OTHER): Payer: Medicare Other | Attending: Internal Medicine | Admitting: Internal Medicine

## 2021-04-11 ENCOUNTER — Other Ambulatory Visit: Payer: Self-pay

## 2021-04-11 DIAGNOSIS — I10 Essential (primary) hypertension: Secondary | ICD-10-CM | POA: Diagnosis not present

## 2021-04-11 DIAGNOSIS — Z87891 Personal history of nicotine dependence: Secondary | ICD-10-CM | POA: Insufficient documentation

## 2021-04-11 DIAGNOSIS — G8929 Other chronic pain: Secondary | ICD-10-CM | POA: Insufficient documentation

## 2021-04-11 DIAGNOSIS — S91301D Unspecified open wound, right foot, subsequent encounter: Secondary | ICD-10-CM

## 2021-04-11 DIAGNOSIS — L97518 Non-pressure chronic ulcer of other part of right foot with other specified severity: Secondary | ICD-10-CM | POA: Insufficient documentation

## 2021-04-11 DIAGNOSIS — L89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, stage 3: Secondary | ICD-10-CM

## 2021-04-11 DIAGNOSIS — I83013 Varicose veins of right lower extremity with ulcer of ankle: Secondary | ICD-10-CM | POA: Insufficient documentation

## 2021-04-15 NOTE — Progress Notes (Signed)
DEMITRI, KUCINSKI ([id number]) Visit Report for 04/11/2021 Chief Complaint Document Details Patient Name: Date of Service: [name], [name] 04/11/2021 2:30 PM Medical Record Number: [id number] Patient Account Number: [id number] Date of Birth/Sex: Treating RN: 1933/03/25 (87 y.o. Charlean Merl, Lauren Primary Care Provider: Shirlean Mylar Other Clinician: Referring Provider: Treating Provider/Extender: Donato Heinz in Treatment: 10 Information Obtained from: Patient Chief Complaint Right foot wound Electronic Signature(s) Signed: 04/11/2021 3:56:13 PM By: Geralyn Corwin DO Entered By: Geralyn Corwin on 04/11/2021 15:52:58 -------------------------------------------------------------------------------- Debridement Details Patient Name: Date of Service: [name] 04/11/2021 2:30 PM Medical Record Number: [id number] Patient Account Number: [id number] Date of Birth/Sex: Treating RN: 11/06/1932 (87 y.o. Elizebeth Koller Primary Care Provider: Shirlean Mylar Other Clinician: Referring Provider: Treating Provider/Extender: Donato Heinz in Treatment: 10 Debridement Performed for Assessment: Wound #1 Right,Lateral Achilles Performed By: Physician Geralyn Corwin, DO Debridement Type: Debridement Level of Consciousness (Pre-procedure): Awake and Alert Pre-procedure Verification/Time Out Yes - 15:42 Taken: Start Time: 15:42 T Area Debrided (L x W): otal 0.6 (cm) x 1.3 (cm) = 0.78 (cm) Tissue and other material debrided: Viable, Non-Viable, Slough, Subcutaneous, Slough Level: Skin/Subcutaneous Tissue Debridement Description: Excisional Instrument: Curette Bleeding: Minimum Hemostasis Achieved: Pressure End Time: 15:43 Procedural Pain: 0 Post Procedural Pain: 0 Response to Treatment: Procedure was tolerated well Level of Consciousness (Post- Awake and Alert procedure): Post Debridement Measurements of Total Wound Length: (cm) 0.6 Width:  (cm) 1.3 Depth: (cm) 0.2 Volume: (cm) 0.123 Character of Wound/Ulcer Post Debridement: Requires Further Debridement Post Procedure Diagnosis Same as Pre-procedure Electronic Signature(s) Signed: 04/11/2021 3:56:13 PM By: Geralyn Corwin DO Signed: 04/15/2021 3:59:15 PM By: Zandra Abts RN, BSN Entered By: Zandra Abts on 04/11/2021 15:49:53 -------------------------------------------------------------------------------- HPI Details Patient Name: Date of Service: [name] 04/11/2021 2:30 PM Medical Record Number: [id number] Patient Account Number: [id number] Date of Birth/Sex: Treating RN: 07/10/1932 (87 y.o. [name] Primary Care Provider: Shirlean Mylar Other Clinician: Referring Provider: Treating Provider/Extender: Donato Heinz in Treatment: 10 History of Present Illness HPI Description: Admission 01/28/2021 Mr. Nachman Sundt is an 87 year old male with a past medical history of essential hypertension that presents to the clinic for a 67-month history of nonhealing wound to his right ankle. He states that he thinks the wound started From his shoes rubbing the back of his ankle. He switched shoes however the wound never healed. He is currently been keeping the area covered and applying antibiotic ointment. He denies signs of infection. 9/27; patient presents for follow-up. He has been using Santyl on the wound bed daily. He has no issues or complaints today. He denies signs of infection. 10/6; patient presents for follow-up. He has been using Santyl on the wound bed daily. He continues to have chronic pain to this area. He denies signs of infection. 10/20; patient presents for follow-up. He has been using Santyl to the wound beds daily. He has no issues or complaints today. He denies signs of infection. 10/27; patient presents for follow-up. He has been using Santyl and Hydrofera Blue to the wound beds. He denies signs of infection. 11/3; patient  presents for follow-up. He has been using Iodoflex without issues. He denies signs of infection. 11/17; patient presents for follow-up. He continues to use Iodoflex without any issues. He currently denies signs of infection. He still reports chronic pain to the area. 12/1; patient presents for follow-up. He has been using Santyl with Hydrofera Blue. He currently denies signs of infection.  He reports improvement to wound healing. Electronic Signature(s) Signed: 04/11/2021 3:56:13 PM By: Geralyn Corwin DO Entered By: Geralyn Corwin on 04/11/2021 15:53:26 -------------------------------------------------------------------------------- Physical Exam Details Patient Name: Date of Service: JOSHUAJAMES, MOEHRING 04/11/2021 2:30 PM Medical Record Number: [id number] Patient Account Number: [id number] Date of Birth/Sex: Treating RN: [birth year] (87 y.o. [name] Primary Care Provider: Shirlean Mylar Other Clinician: Referring Provider: Treating Provider/Extender: Donato Heinz in Treatment: 10 Constitutional respirations regular, non-labored and within target range for patient.. Cardiovascular 2+ dorsalis pedis/posterior tibialis pulses. Psychiatric pleasant and cooperative. Notes Right posterior ankle: Open wound with granulation tissue, slough and fibrinous tissue. No evidence of active infection. Electronic Signature(s) Signed: 04/11/2021 3:56:13 PM By: Geralyn Corwin DO Entered By: Geralyn Corwin on 04/11/2021 15:53:59 -------------------------------------------------------------------------------- Physician Orders Details Patient Name: Date of Service: [name] 04/11/2021 2:30 PM Medical Record Number: [id number] Patient Account Number: [id number] Date of Birth/Sex: Treating RN: 05/03/1933 (87 y.o. 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 - Dr. Mikey Bussing Return A Bathing/ Shower/ Hygiene May shower and wash wound with soap and water. Off-Loading Other: - keep pressure off of right lateral achilles Wound Treatment Wound #1 - Achilles Wound Laterality: Right, Lateral Cleanser: Soap and Water Every Other Day/15 Days Discharge Instructions: May shower and wash wound with dial antibacterial soap and water prior to dressing change. Peri-Wound Care: Skin Prep (Generic) Every Other Day/15 Days Discharge Instructions: Use skin prep as directed Prim Dressing: Hydrofera Blue Ready Foam, 2.5 x2.5 in Every Other Day/15 Days ary Discharge Instructions: Apply Hydrofera over Santyl Prim Dressing: Santyl Ointment Every Other Day/15 Days ary Discharge Instructions: Apply nickel thick amount to wound bed as instructed Secondary Dressing: Zetuvit Plus Silicone Border Dressing 4x4 (in/in) (Generic) Every Other Day/15 Days Discharge Instructions: Apply silicone border over primary dressing as directed. Electronic Signature(s) Signed: 04/11/2021 3:56:13 PM By: Geralyn Corwin DO Entered By: Geralyn Corwin on 04/11/2021 15:54:21 -------------------------------------------------------------------------------- Problem List Details Patient Name: Date of Service: [name] 04/11/2021 2:30 PM Medical Record Number: [id number] Patient Account Number: [id number] Date of Birth/Sex: Treating RN: 10-Mar-1933 (87 y.o. 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, stage 3 03/14/2021 No Yes S91.301D Unspecified open wound, right foot, subsequent encounter 02/05/2021 No Yes I10 Essential (primary) hypertension 01/28/2021 No Yes Inactive Problems ICD-10 Code Description Active Date Inactive Date S91.301A Unspecified open wound, right foot, initial encounter 01/28/2021 01/29/2021 Resolved Problems Electronic Signature(s) Signed: 04/11/2021 3:56:13 PM By: Geralyn Corwin DO Entered By: Geralyn Corwin on 04/11/2021 15:52:36 -------------------------------------------------------------------------------- Progress Note Details Patient Name: Date of Service: [name] 04/11/2021 2:30 PM Medical Record Number: [id number] Patient Account Number: [id number] Date of Birth/Sex: Treating RN: [birth year] (87 y.o. Charlean Merl, Lauren Primary Care Provider: Shirlean Mylar Other Clinician: Referring Provider: Treating Provider/Extender: Donato Heinz in Treatment: 10 Subjective Chief Complaint Information obtained from  Patient Right foot wound History of Present Illness (HPI) Admission 01/28/2021 Mr. Cleotha Tsang is an 87 year old male with a past medical history of essential hypertension that presents to the clinic for a 79-month history of nonhealing wound to his right ankle. He states that he thinks the wound started From his shoes rubbing the back of his ankle. He switched shoes however the wound never healed. He is currently been keeping the area covered and applying antibiotic ointment. He denies signs of infection. 9/27; patient presents for follow-up. He has been using Santyl on the wound bed daily. He has no issues or complaints today. He denies signs of infection. 10/6; patient presents for follow-up. He has  been using Santyl on the wound bed daily. He continues to have chronic pain to this area. He denies signs of infection. 10/20; patient presents for follow-up. He has been using Santyl to the wound beds daily. He has no issues or complaints today. He denies signs of infection. 10/27; patient presents for follow-up. He has been using Santyl and Hydrofera Blue to the wound beds. He denies signs of infection. 11/3; patient presents for follow-up. He has been using Iodoflex without issues. He denies signs of infection. 11/17; patient presents for follow-up. He continues to use Iodoflex without any issues. He currently denies signs of infection. He still reports chronic pain to the area. 12/1; patient presents for follow-up. He has been using Santyl with Hydrofera Blue. He currently denies signs of infection. He reports improvement to wound healing. Patient History Information obtained from Patient. Family History Cancer - Siblings, Diabetes - Siblings, Kidney Disease - Siblings, No family history of Heart Disease, Hereditary Spherocytosis, Hypertension, Lung Disease, Seizures, Stroke, Thyroid Problems, Tuberculosis. Social History Former smoker - quit age 28, Marital Status - Married, Alcohol Use - Never, Drug Use - No History, Caffeine Use - Daily - coffee. Medical History Eyes Patient has history of Cataracts - bil removed Denies history of Glaucoma, Optic Neuritis Ear/Nose/Mouth/Throat Denies history of Chronic sinus problems/congestion, Middle ear problems Cardiovascular Patient has history of Hypertension Musculoskeletal Patient has history of Osteoarthritis Oncologic Denies history of Received Chemotherapy, Received Radiation Psychiatric Denies history of Anorexia/bulimia, Confinement Anxiety Hospitalization/Surgery History - bil rotator cuff surgeries. Medical A Surgical History Notes nd Ear/Nose/Mouth/Throat extremely hard of hearing Neurologic hx cervical  fractures Objective Constitutional respirations regular, non-labored and within target range for patient.. Vitals Time Taken: 3:16 PM, Height: 60 in, Weight: 122 lbs, BMI: 23.8, Temperature: 98.5 F, Pulse: 76 bpm, Respiratory Rate: 16 breaths/min, Blood Pressure: 160/87 mmHg. Cardiovascular 2+ dorsalis pedis/posterior tibialis pulses. Psychiatric pleasant and cooperative. General Notes: Right posterior ankle: Open wound with granulation tissue, slough and fibrinous tissue. No evidence of active infection. Integumentary (Hair, Skin) Wound #1 status is Open. Original cause of wound was Gradually Appeared. The date acquired was: 05/16/2020. The wound has been in treatment 5 weeks. The wound is located on the Right,Lateral Achilles. The wound measures 0.6cm length x 1.3cm width x 0.2cm depth; 0.613cm^2 area and 0.123cm^3 volume. There is Fat Layer (Subcutaneous Tissue) exposed. There is no tunneling or undermining noted. There is a medium amount of serous drainage noted. The wound margin is distinct with the outline attached to the wound base. There is large (67-100%) pink granulation within the wound bed. There is a small (1-33%) amount of necrotic tissue within the wound bed including Adherent Slough. 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 No signs of infection on exam. I debrided nonviable tissue. I recommended continuing Santyl and Hydrofera Blue. He did complete his right ankle x-ray that showed no evidence of osseous erosion to suggest osteomyelitis. Patient would like to follow-up in 2 weeks. Procedures Wound #1 Pre-procedure diagnosis of Wound #1 is an Atypical located on the Right,Lateral Achilles . There was a  Excisional Skin/Subcutaneous Tissue Debridement with a total area of 0.78 sq cm performed by Geralyn Corwin, DO. With the following instrument(s): Curette to remove Viable and Non-Viable tissue/material. Material removed includes Subcutaneous Tissue and Slough and. No specimens were taken. A time out was conducted at 15:42, prior to the start of the procedure. A Minimum amount of bleeding was controlled with Pressure. The procedure was tolerated well with a pain level of 0 throughout and a pain level of 0 following the procedure. Post Debridement Measurements: 0.6cm length x 1.3cm width x 0.2cm depth; 0.123cm^3 volume. Character of Wound/Ulcer Post Debridement requires further debridement. Post procedure Diagnosis Wound #1: Same as Pre-Procedure Plan Follow-up Appointments: Return Appointment in 2 weeks. - Dr. Mikey Bussing Bathing/ Shower/ Hygiene: May shower and wash wound with soap and water. Off-Loading: Other: - keep pressure off of right lateral achilles WOUND #1: - Achilles Wound Laterality: Right, Lateral Cleanser: Soap and Water Every Other Day/15 Days Discharge Instructions: May shower and wash wound with dial antibacterial soap and water prior to dressing change. Peri-Wound Care: Skin Prep (Generic) Every Other Day/15 Days Discharge Instructions: Use skin prep as directed Prim Dressing: Hydrofera Blue Ready Foam, 2.5 x2.5 in Every Other Day/15 Days ary Discharge Instructions: Apply Hydrofera over Santyl Prim Dressing: Santyl Ointment Every Other Day/15 Days ary Discharge Instructions: Apply nickel thick amount to wound bed as instructed Secondary Dressing: Zetuvit Plus Silicone Border Dressing 4x4 (in/in) (Generic) Every Other Day/15 Days Discharge Instructions: Apply silicone border over primary dressing as directed. 1. Santyl and Hydrofera Blue 2. Follow-up in 2 weeks Electronic Signature(s) Signed: 04/11/2021 3:56:13 PM By: Geralyn Corwin DO Entered By: Geralyn Corwin on  04/11/2021 15:55:37 -------------------------------------------------------------------------------- HxROS Details Patient Name: Date of Service: [name] 04/11/2021 2:30 PM Medical Record Number: [id number] Patient Account Number: [id number] Date of Birth/Sex: Treating RN: [birth year] (87 y.o. [name] Primary Care Provider: Other Clinician: Shirlean Mylar Referring Provider: Treating Provider/Extender: Donato Heinz in Treatment: 10 Information Obtained From Patient Eyes Medical History: Positive for: Cataracts - bil removed Negative for: Glaucoma; Optic Neuritis Ear/Nose/Mouth/Throat Medical History: Negative for: Chronic sinus problems/congestion; Middle ear problems Past Medical History Notes: extremely hard of hearing Cardiovascular Medical History: Positive for: Hypertension Musculoskeletal Medical History: Positive for: Osteoarthritis Neurologic Medical History: Past Medical History Notes: hx cervical fractures Oncologic Medical History: Negative for: Received Chemotherapy; Received Radiation Psychiatric Medical History: Negative for: Anorexia/bulimia; Confinement Anxiety HBO Extended History Items Eyes: Cataracts Immunizations Pneumococcal Vaccine: Received Pneumococcal Vaccination: Yes Received Pneumococcal Vaccination On or After 60th Birthday: Yes Implantable Devices None Hospitalization / Surgery History Type of Hospitalization/Surgery bil rotator cuff surgeries Family and Social History Cancer: Yes - Siblings; Diabetes: Yes - Siblings; Heart Disease: No; Hereditary Spherocytosis: No; Hypertension: No; Kidney Disease: Yes - Siblings; Lung Disease: No; Seizures: No; Stroke: No; Thyroid Problems: No; Tuberculosis: No; Former smoker - quit age 41; Marital Status - Married; Alcohol Use: Never; Drug Use: No History; Caffeine Use: Daily - coffee; Financial Concerns: No; Food,  Clothing or Shelter Needs: No; Support  System Lacking: No; Transportation Concerns: No Electronic Signature(s) Signed: 04/11/2021 3:56:13 PM By: Geralyn Corwin DO Signed: 04/11/2021 5:20:51 PM By: Fonnie Mu RN Entered By: Geralyn Corwin on 04/11/2021 15:53:35 -------------------------------------------------------------------------------- SuperBill Details Patient Name: Date of Service: [name] 04/11/2021 Medical Record Number: [id number] Patient Account Number: [id number] Date of Birth/Sex: Treating RN: 08-22-1932 (87 y.o. 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, stage 3 S91.301D Unspecified open wound, right foot, subsequent encounter I10 Essential (primary) hypertension Facility Procedures CPT4 Code: [id number] Description: 11042 - DEB SUBQ TISSUE 20 SQ CM/< ICD-10 Diagnosis Description S91.301D Unspecified open wound, right foot, subsequent encounter 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

## 2021-04-15 NOTE — Progress Notes (Signed)
SPARROW, SANZO ([id number]) Visit Report for 04/11/2021 Arrival Information Details Patient Name: Date of Service: CAIDE, CAMPI 04/11/2021 2:30 PM Medical Record Number: [id number] Patient Account Number: [id number] Date of Birth/Sex: Treating RN: [birth year] (87 y.o. Charlean Merl, Lauren Primary Care Quantavis Obryant: Shirlean Mylar Other Clinician: Referring Hughey Rittenberry: Treating Kenijah Benningfield/Extender: Donato Heinz in Treatment: 10 Visit Information History Since Last Visit Added or deleted any medications: No Patient Arrived: Ambulatory Any new allergies or adverse reactions: No Arrival Time: 15:13 Had a fall or experienced change in No Accompanied By: spouse activities of daily living that may affect Transfer Assistance: None risk of falls: Patient Requires Transmission-Based Precautions: No Signs or symptoms of abuse/neglect since last visito No Patient Has Alerts: Yes Hospitalized since last visit: No Patient Alerts: R ABI=1.12, L ABI=1.13 Implantable device outside of the clinic excluding No cellular tissue based products placed in the center since last visit: Has Dressing in Place as Prescribed: Yes Pain Present Now: No Electronic Signature(s) Signed: 04/11/2021 5:42:59 PM By: Karie Schwalbe RN Entered By: Karie Schwalbe on 04/11/2021 15:14:32 -------------------------------------------------------------------------------- Encounter Discharge Information Details Patient Name: Date of Service: [name] 04/11/2021 2:30 PM Medical Record Number: [id number] Patient Account Number: [id number] Date of Birth/Sex: Treating RN: 08-22-1932 (87 y.o. Elizebeth Koller Primary Care Aurore Redinger: Shirlean Mylar Other Clinician: Referring Kayliee Atienza: Treating Deborahann Poteat/Extender: Donato Heinz in Treatment: 10 Encounter Discharge Information Items Post Procedure Vitals Discharge Condition: Stable Temperature (F): 98.5 Ambulatory Status: Ambulatory Pulse  (bpm): 76 Discharge Destination: Home Respiratory Rate (breaths/min): 16 Transportation: Private Auto Blood Pressure (mmHg): 160/87 Accompanied By: wife Schedule Follow-up Appointment: Yes Clinical Summary of Care: Patient Declined Electronic Signature(s) Signed: 04/15/2021 3:59:15 PM By: Zandra Abts RN, BSN Entered By: Zandra Abts on 04/11/2021 17:21:33 -------------------------------------------------------------------------------- Lower Extremity Assessment Details Patient Name: Date of Service: [name] 04/11/2021 2:30 PM Medical Record Number: [id number] Patient Account Number: [id number] Date of Birth/Sex: Treating RN: [birth year] (87 y.o. Charlean Merl, Lauren Primary Care Krithik Mapel: Shirlean Mylar Other Clinician: Referring Caela Huot: Treating Andilyn Bettcher/Extender: Donato Heinz in Treatment: 10 Edema Assessment Assessed: Kyra Searles: No] Franne Forts: No] Edema: [Left: N] [Right: o] Calf Left: Right: Point of Measurement: From Medial Instep 30.3 cm Ankle Left: Right: Point of Measurement: From Medial Instep 19 cm Electronic Signature(s) Signed: 04/11/2021 5:20:51 PM By: Fonnie Mu RN Signed: 04/11/2021 5:42:59 PM By: Karie Schwalbe RN Entered By: Karie Schwalbe on 04/11/2021 15:23:30 -------------------------------------------------------------------------------- Multi Wound Chart Details Patient Name: Date of Service: [name] 04/11/2021 2:30 PM Medical Record Number: [id number] Patient Account Number: [id number] Date of Birth/Sex: Treating RN: 1933-03-09 (87 y.o. Charlean Merl, Lauren Primary Care Aroush Chasse: Shirlean Mylar Other Clinician: Referring Aiyonna Lucado: Treating Azure Budnick/Extender: Donato Heinz in Treatment: 10 Vital Signs Height(in): 60 Pulse(bpm): 76 Weight(lbs): 122 Blood Pressure(mmHg): 160/87 Body Mass Index(BMI): 24 Temperature(F): 98.5 Respiratory Rate(breaths/min): 16 Photos:  [N/A:N/A] Right, Lateral Achilles N/A N/A Wound Location: Gradually Appeared N/A N/A Wounding Event: Atypical N/A N/A Primary Etiology: Cataracts, Hypertension, N/A N/A Comorbid History: Osteoarthritis 05/16/2020 N/A N/A Date Acquired: 5 N/A N/A Weeks of Treatment: Open N/A N/A Wound Status: 0.6x1.3x0.2 N/A N/A Measurements L x W x D (cm) 0.613 N/A N/A A (cm) : rea 0.123 N/A N/A Volume (cm) : 50.00% N/A N/A % Reduction in Area: 74.90% N/A N/A % Reduction in Volume: Full Thickness Without Exposed N/A N/A Classification: Support Structures Medium N/A N/A Exudate A mount: Serous N/A N/A Exudate Type: amber N/A N/A Exudate  Color: Distinct, outline attached N/A N/A Wound Margin: Large (67-100%) N/A N/A Granulation A mount: Pink N/A N/A Granulation Quality: Small (1-33%) N/A N/A Necrotic A mount: Fat Layer (Subcutaneous Tissue): Yes N/A N/A Exposed Structures: Fascia: No Tendon: No Muscle: No Joint: No Bone: No Medium (34-66%) N/A N/A Epithelialization: Debridement - Excisional N/A N/A Debridement: Pre-procedure Verification/Time Out 15:42 N/A N/A Taken: Subcutaneous, Slough N/A N/A Tissue Debrided: Skin/Subcutaneous Tissue N/A N/A Level: 0.78 N/A N/A Debridement A (sq cm): rea Curette N/A N/A Instrument: Minimum N/A N/A Bleeding: Pressure N/A N/A Hemostasis A chieved: 0 N/A N/A Procedural Pain: 0 N/A N/A Post Procedural Pain: Procedure was tolerated well N/A N/A Debridement Treatment Response: 0.6x1.3x0.2 N/A N/A Post Debridement Measurements L x W x D (cm) 0.123 N/A N/A Post Debridement Volume: (cm) Debridement N/A N/A Procedures Performed: Treatment Notes Electronic Signature(s) Signed: 04/11/2021 3:56:13 PM By: Geralyn Corwin DO Signed: 04/11/2021 5:20:51 PM By: Fonnie Mu RN Entered By: Geralyn Corwin on 04/11/2021  15:52:43 -------------------------------------------------------------------------------- Multi-Disciplinary Care Plan Details Patient Name: Date of Service: [name] 04/11/2021 2:30 PM Medical Record Number: [id number] Patient Account Number: [id number] Date of Birth/Sex: Treating RN: [birth year] (87 y.o. Elizebeth Koller Primary Care Hardin Hardenbrook: Shirlean Mylar Other Clinician: Referring Lennox Leikam: Treating Taino Maertens/Extender: Donato Heinz in Treatment: 10 Multidisciplinary Care Plan reviewed with physician Active Inactive Wound/Skin Impairment Nursing Diagnoses: Impaired tissue integrity Knowledge deficit related to ulceration/compromised skin integrity Goals: Patient/caregiver will verbalize understanding of skin care regimen Date Initiated: 01/28/2021 Target Resolution Date: 05/10/2021 Goal Status: Active Interventions: Assess patient/caregiver ability to obtain necessary supplies Assess patient/caregiver ability to perform ulcer/skin care regimen upon admission and as needed Assess ulceration(s) every visit Provide education on ulcer and skin care Notes: 03/07/21: Patient/Caregiver performing dressing changes, new treatment this week. Electronic Signature(s) Signed: 04/15/2021 3:59:15 PM By: Zandra Abts RN, BSN Entered By: Zandra Abts on 04/11/2021 15:44:04 -------------------------------------------------------------------------------- Pain Assessment Details Patient Name: Date of Service: [name] 04/11/2021 2:30 PM Medical Record Number: [id number] Patient Account Number: [id number] Date of Birth/Sex: Treating RN: 04-14-1933 (87 y.o. Charlean Merl, Lauren Primary Care Javona Bergevin: Shirlean Mylar Other Clinician: Referring Anuj Summons: Treating Lael Pilch/Extender: Donato Heinz in Treatment: 10 Active Problems Location of Pain Severity and Description of Pain Patient Has Paino No Site Locations Pain Management and  Medication Current Pain Management: Electronic Signature(s) Signed: 04/11/2021 5:20:51 PM By: Fonnie Mu RN Signed: 04/11/2021 5:42:59 PM By: Karie Schwalbe RN Entered By: Karie Schwalbe on 04/11/2021 15:17:10 -------------------------------------------------------------------------------- Patient/Caregiver Education Details Patient Name: Date of Service: [name] 12/1/2022andnbsp2:30 PM Medical Record Number: [id number] Patient Account Number: [id number] Date of Birth/Gender: Treating RN: [birth year] (87 y.o. Elizebeth Koller Primary Care Physician: Shirlean Mylar Other Clinician: Referring Physician: Treating Physician/Extender: Donato Heinz in Treatment: 10 Education Assessment Education Provided To: Patient Education Topics Provided Wound/Skin Impairment: Methods: Explain/Verbal Responses: State content correctly [employer]) Signed: 04/15/2021 3:59:15 PM By: Zandra Abts RN, BSN Entered By: Zandra Abts on 04/11/2021 15:44:16 -------------------------------------------------------------------------------- Wound Assessment Details Patient Name: Date of Service: [name] 04/11/2021 2:30 PM Medical Record Number: [id number] Patient Account Number: [id number] Date of Birth/Sex: Treating RN: 1932/12/26 (87 y.o. Charlean Merl, Lauren Primary Care Cordelro Gautreau: Shirlean Mylar Other Clinician: Referring Zaiyden Strozier: Treating Billey Wojciak/Extender: Donato Heinz in Treatment: 10 Wound Status Wound Number: 1 Primary Etiology: Atypical Wound Location: Right, Lateral Achilles Wound Status: Open Wounding Event: Gradually Appeared Comorbid History: Cataracts, Hypertension, Osteoarthritis Date Acquired: 05/16/2020 Weeks Of  Treatment: 5 Clustered Wound: No Photos Wound Measurements Length: (cm) 0.6 Width: (cm) 1.3 Depth: (cm) 0.2 Area: (cm) 0.613 Volume: (cm) 0.123 % Reduction in Area: 50% % Reduction in  Volume: 74.9% Epithelialization: Medium (34-66%) Tunneling: No Undermining: No Wound Description Classification: Full Thickness Without Exposed Support Structures Wound Margin: Distinct, outline attached Exudate Amount: Medium Exudate Type: Serous Exudate Color: amber Foul Odor After Cleansing: No Slough/Fibrino Yes Wound Bed Granulation Amount: Large (67-100%) Exposed Structure Granulation Quality: Pink Fascia Exposed: No Necrotic Amount: Small (1-33%) Fat Layer (Subcutaneous Tissue) Exposed: Yes Necrotic Quality: Adherent Slough Tendon Exposed: No Muscle Exposed: No Joint Exposed: No Bone Exposed: No Treatment Notes Wound #1 (Achilles) Wound Laterality: Right, Lateral Cleanser Soap and Water Discharge Instruction: May shower and wash wound with dial antibacterial soap and water prior to dressing change. Peri-Wound Care Skin Prep Discharge Instruction: Use skin prep as directed Topical Primary Dressing Hydrofera Blue Ready Foam, 2.5 x2.5 in Discharge Instruction: Apply Hydrofera over Santyl Santyl Ointment Discharge Instruction: Apply nickel thick amount to wound bed as instructed Secondary Dressing Zetuvit Plus Silicone Border Dressing 4x4 (in/in) Discharge Instruction: Apply silicone border over primary dressing as directed. Secured With Compression Wrap Compression Stockings Facilities manager) Signed: 04/11/2021 5:20:51 PM By: Fonnie Mu RN Signed: 04/11/2021 5:42:59 PM By: Karie Schwalbe RN Entered By: Karie Schwalbe on 04/11/2021 15:28:06 -------------------------------------------------------------------------------- Vitals Details Patient Name: Date of Service: [name] 04/11/2021 2:30 PM Medical Record Number: [id number] Patient Account Number: [id number] Date of Birth/Sex: Treating RN: [birth year] (87 y.o. Charlean Merl, Lauren Primary Care Travonne Schowalter: Shirlean Mylar Other Clinician: Referring Jaquarius Seder: Treating  Xan Ingraham/Extender: Donato Heinz in Treatment: 10 Vital Signs Time Taken: 15:16 Temperature (F): 98.5 Height (in): 60 Pulse (bpm): 76 Weight (lbs): 122 Respiratory Rate (breaths/min): 16 Body Mass Index (BMI): 23.8 Blood Pressure (mmHg): 160/87 Reference Range: 80 - 120 mg / dl Electronic Signature(s) Signed: 04/11/2021 5:42:59 PM By: Karie Schwalbe RN Signed: 04/11/2021 5:42:59 PM By: Karie Schwalbe RN Entered By: Karie Schwalbe on 04/11/2021 15:16:58

## 2021-04-18 DIAGNOSIS — G629 Polyneuropathy, unspecified: Secondary | ICD-10-CM | POA: Diagnosis not present

## 2021-04-25 ENCOUNTER — Encounter (HOSPITAL_BASED_OUTPATIENT_CLINIC_OR_DEPARTMENT_OTHER): Payer: Medicare Other | Admitting: Internal Medicine

## 2021-04-25 ENCOUNTER — Other Ambulatory Visit: Payer: Self-pay

## 2021-04-25 DIAGNOSIS — L97518 Non-pressure chronic ulcer of other part of right foot with other specified severity: Secondary | ICD-10-CM | POA: Diagnosis not present

## 2021-04-25 DIAGNOSIS — G8929 Other chronic pain: Secondary | ICD-10-CM | POA: Diagnosis not present

## 2021-04-25 DIAGNOSIS — Z87891 Personal history of nicotine dependence: Secondary | ICD-10-CM | POA: Diagnosis not present

## 2021-04-25 DIAGNOSIS — I83013 Varicose veins of right lower extremity with ulcer of ankle: Secondary | ICD-10-CM | POA: Diagnosis not present

## 2021-04-25 DIAGNOSIS — I10 Essential (primary) hypertension: Secondary | ICD-10-CM | POA: Diagnosis not present

## 2021-04-25 NOTE — Progress Notes (Signed)
[name], [name] ([id number]) Visit Report for 04/25/2021 Arrival Information Details Patient Name: Date of Service: [name], [name] 04/25/2021 3:30 PM Medical Record Number: [id number] Patient Account Number: [id number] Date of Birth/Sex: Treating RN: 29-Mar-1933 (87 y.o. [name] Primary Care Mayuri Staples: Shirlean Mylar Other Clinician: Referring Kristyn Obyrne: Treating Juvencio Verdi/Extender: Donato Heinz in Treatment: 12 Visit Information History Since Last Visit Added or deleted any medications: No Patient Arrived: Ambulatory Any new allergies or adverse reactions: No Arrival Time: 15:34 Had a fall or experienced change in No Accompanied By: spouse activities of daily living that may affect Transfer Assistance: None risk of falls: Patient Identification Verified: Yes Signs or symptoms of abuse/neglect since last visito No Secondary Verification Process Completed: Yes Hospitalized since last visit: No Patient Requires Transmission-Based Precautions: No Implantable device outside of the clinic excluding No Patient Has Alerts: Yes cellular tissue based products placed in the center Patient Alerts: R ABI=1.12, L ABI=1.13 since last visit: Has Dressing in Place as Prescribed: Yes Pain Present Now: No Electronic Signature(s) Signed: 04/25/2021 5:42:26 PM By: Antonieta Iba Entered By: Antonieta Iba on 04/25/2021 15:37:39 -------------------------------------------------------------------------------- Encounter Discharge Information Details Patient Name: Date of Service: [name]. 04/25/2021 3:30 PM Medical Record Number: [id number] Patient Account Number: [id number] Date of Birth/Sex: Treating RN: [birth year] (88 y.o. [name] Primary Care Avianah Pellman: Shirlean Mylar Other Clinician: Referring Keesha Pellum: Treating Lasharn Bufkin/Extender: Donato Heinz in Treatment: 12 Encounter Discharge Information Items Post Procedure Vitals Discharge  Condition: Stable Temperature (F): 98.6 Ambulatory Status: Ambulatory Pulse (bpm): 81 Discharge Destination: Home Respiratory Rate (breaths/min): 16 Transportation: Private Auto Blood Pressure (mmHg): 164/85 Accompanied By: wife Schedule Follow-up Appointment: Yes Clinical Summary of Care: Provided on 04/25/2021 Form Type Recipient Paper Patient Patient Electronic Signature(s) Signed: 04/25/2021 4:35:50 PM By: Antonieta Iba Entered By: Antonieta Iba on 04/25/2021 16:35:50 -------------------------------------------------------------------------------- Lower Extremity Assessment Details Patient Name: Date of Service: [name], [name] 04/25/2021 3:30 PM Medical Record Number: [id number] Patient Account Number: [id number] Date of Birth/Sex: Treating RN: [birth year] (87 y.o. [name] Primary Care Treasure Ochs: Shirlean Mylar Other Clinician: Referring Harun Brumley: Treating Adar Rase/Extender: Donato Heinz in Treatment: 12 Edema Assessment Assessed: Kyra Searles: No] Franne Forts: Yes] Edema: [Left: N] [Right: o] Calf Left: Right: Point of Measurement: From Medial Instep 30 cm Ankle Left: Right: Point of Measurement: From Medial Instep 18.8 cm Vascular Assessment Pulses: Dorsalis Pedis Palpable: [Right:Yes] Electronic Signature(s) Signed: 04/25/2021 5:42:26 PM By: Antonieta Iba Entered By: Antonieta Iba on 04/25/2021 15:43:38 -------------------------------------------------------------------------------- Multi Wound Chart Details Patient Name: Date of Service: [name]. 04/25/2021 3:30 PM Medical Record Number: [id number] Patient Account Number: [id number] Date of Birth/Sex: Treating RN: May 22, 1932 (87 y.o. M) Primary Care Epifania Littrell: Shirlean Mylar Other Clinician: Referring Jamell Laymon: Treating Placida Cambre/Extender: Donato Heinz in Treatment: 12 Vital Signs Height(in): 60 Pulse(bpm): 81 Weight(lbs): 122 Blood Pressure(mmHg):  164/85 Body Mass Index(BMI): 24 Temperature(F): 98.6 Respiratory Rate(breaths/min): 16 Photos: [1:No Photos Right, Lateral Achilles] [N/A:N/A N/A] Wound Location: [1:Gradually Appeared] [N/A:N/A] Wounding Event: [1:Atypical] [N/A:N/A] Primary Etiology: [1:Cataracts, Hypertension,] [N/A:N/A] Comorbid History: [1:Osteoarthritis 05/16/2020] [N/A:N/A] Date Acquired: [1:7] [N/A:N/A] Weeks of Treatment: [1:Open] [N/A:N/A] Wound Status: [1:0.8x1x0.2] [N/A:N/A] Measurements L x W x D (cm) [1:0.628] [N/A:N/A] A (cm) : rea [1:0.126] [N/A:N/A] Volume (cm) : [1:48.70%] [N/A:N/A] % Reduction in Area: [1:74.30%] [N/A:N/A] % Reduction in Volume: [1:Full Thickness Without Exposed] [N/A:N/A] Classification: [1:Support Structures Medium] [N/A:N/A] Exudate A mount: [1:Serous] [N/A:N/A] Exudate Type: [1:amber] [N/A:N/A] Exudate Color: [1:Distinct, outline attached] [N/A:N/A]  Wound Margin: [1:Medium (34-66%)] [N/A:N/A] Granulation A mount: [1:Pink] [N/A:N/A] Granulation Quality: [1:Medium (34-66%)] [N/A:N/A] Necrotic A mount: [1:Fat Layer (Subcutaneous Tissue): Yes N/A] Exposed Structures: [1:Fascia: No Tendon: No Muscle: No Joint: No Bone: No Medium (34-66%)] [N/A:N/A] Epithelialization: [1:Debridement - Excisional] [N/A:N/A] Debridement: Pre-procedure Verification/Time Out 15:55 [N/A:N/A] Taken: [1:Other] [N/A:N/A] Pain Control: [1:Subcutaneous, Slough] [N/A:N/A] Tissue Debrided: [1:Skin/Subcutaneous Tissue] [N/A:N/A] Level: [1:0.8] [N/A:N/A] Debridement A (sq cm): [1:rea Curette] [N/A:N/A] Instrument: [1:Minimum] [N/A:N/A] Bleeding: [1:Pressure] [N/A:N/A] Hemostasis A chieved: [1:Procedure was tolerated well] [N/A:N/A] Debridement Treatment Response: [1:0.8x1x0.2] [N/A:N/A] Post Debridement Measurements L x W x D (cm) [1:0.126] [N/A:N/A] Post Debridement Volume: (cm) [1:Debridement] [N/A:N/A] Treatment Notes Electronic Signature(s) Signed: 04/25/2021 4:19:33 PM By: Geralyn Corwin  DO Entered By: Geralyn Corwin on 04/25/2021 16:16:00 -------------------------------------------------------------------------------- Multi-Disciplinary Care Plan Details Patient Name: Date of Service: [name]. 04/25/2021 3:30 PM Medical Record Number: [id number] Patient Account Number: [id number] Date of Birth/Sex: Treating RN: 1932-06-05 (87 y.o. [name] Primary Care Camden Mazzaferro: Shirlean Mylar Other Clinician: Referring Orlen Leedy: Treating Indra Wolters/Extender: Donato Heinz in Treatment: 12 Multidisciplinary Care Plan reviewed with physician Active Inactive Wound/Skin Impairment Nursing Diagnoses: Impaired tissue integrity Knowledge deficit related to ulceration/compromised skin integrity Goals: Patient/caregiver will verbalize understanding of skin care regimen Date Initiated: 01/28/2021 Target Resolution Date: 05/10/2021 Goal Status: Active Interventions: Assess patient/caregiver ability to obtain necessary supplies Assess patient/caregiver ability to perform ulcer/skin care regimen upon admission and as needed Assess ulceration(s) every visit Provide education on ulcer and skin care Notes: 03/07/21: Patient/Caregiver performing dressing changes, new treatment this week. Electronic Signature(s) Signed: 04/25/2021 5:42:26 PM By: Antonieta Iba Entered By: Antonieta Iba on 04/25/2021 15:33:41 -------------------------------------------------------------------------------- Pain Assessment Details Patient Name: Date of Service: HARUTO, DEMARIA 04/25/2021 3:30 PM Medical Record Number: [id number] Patient Account Number: [id number] Date of Birth/Sex: Treating RN: [birth year] (88 y.o. [name] Primary Care Keneisha Heckart: Shirlean Mylar Other Clinician: Referring Susi Goslin: Treating Afsheen Antony/Extender: Donato Heinz in Treatment: 12 Active Problems Location of Pain Severity and Description of Pain Patient Has Paino  No Site Locations Pain Management and Medication Current Pain Management: Electronic Signature(s) Signed: 04/25/2021 5:42:26 PM By: Antonieta Iba Entered By: Antonieta Iba on 04/25/2021 15:37:47 -------------------------------------------------------------------------------- Patient/Caregiver Education Details Patient Name: Date of Service: [name] 12/15/2022andnbsp3:30 PM Medical Record Number: [id number] Patient Account Number: [id number] Date of Birth/Gender: Treating RN: August 25, 1932 (87 y.o. [name] Primary Care Physician: Shirlean Mylar Other Clinician: Referring Physician: Treating Physician/Extender: Donato Heinz in Treatment: 12 Education Assessment Education Provided To: Patient Education Topics Provided Wound/Skin Impairment: Methods: Demonstration, Explain/Verbal, Printed Responses: State content correctly [employer]) Signed: 04/25/2021 5:42:26 PM By: Antonieta Iba Entered By: Antonieta Iba on 04/25/2021 15:33:59 -------------------------------------------------------------------------------- Wound Assessment Details Patient Name: Date of Service: [name] 04/25/2021 3:30 PM Medical Record Number: [id number] Patient Account Number: [id number] Date of Birth/Sex: Treating RN: 18-May-1932 (87 y.o. [name] Primary Care Marrell Dicaprio: Shirlean Mylar Other Clinician: Referring Keval Nam: Treating Vasilis Luhman/Extender: Donato Heinz in Treatment: 12 Wound Status Wound Number: 1 Primary Etiology: Atypical Wound Location: Right, Lateral Achilles Wound Status: Open Wounding Event: Gradually Appeared Comorbid History: Cataracts, Hypertension, Osteoarthritis Date Acquired: 05/16/2020 Weeks Of Treatment: 7 Clustered Wound: No Photos Photo Uploaded By: Haywood Pao on 04/25/2021 16:31:21 Wound Measurements Length: (cm) 0.8 Width: (cm) 1 Depth: (cm) 0.2 Area: (cm) 0.628 Volume:  (cm) 0.126 % Reduction in Area: 48.7% % Reduction in Volume: 74.3% Epithelialization: Medium (34-66%) Tunneling: No Undermining: No Wound Description  Classification: Full Thickness Without Exposed Support Structures Wound Margin: Distinct, outline attached Exudate Amount: Medium Exudate Type: Serous Exudate Color: amber Foul Odor After Cleansing: No Slough/Fibrino Yes Wound Bed Granulation Amount: Medium (34-66%) Exposed Structure Granulation Quality: Pink Fascia Exposed: No Necrotic Amount: Medium (34-66%) Fat Layer (Subcutaneous Tissue) Exposed: Yes Necrotic Quality: Adherent Slough Tendon Exposed: No Muscle Exposed: No Joint Exposed: No Bone Exposed: No Treatment Notes Wound #1 (Achilles) Wound Laterality: Right, Lateral Cleanser Soap and Water Discharge Instruction: May shower and wash wound with dial antibacterial soap and water prior to dressing change. Peri-Wound Care Skin Prep Discharge Instruction: Use skin prep as directed Topical Primary Dressing Hydrofera Blue Ready Foam, 2.5 x2.5 in Discharge Instruction: Apply Hydrofera over Santyl Santyl Ointment Discharge Instruction: Apply nickel thick amount to wound bed as instructed Secondary Dressing ALLEVYN Heel 4 1/2in x 5 1/2in / 10.5cm x 13.5cm Discharge Instruction: Apply over primary dressing as directed. Zetuvit Plus Silicone Border Dressing 4x4 (in/in) Discharge Instruction: Apply silicone border over primary dressing as directed. Secured With Compression Wrap Compression Stockings Facilities manager) Signed: 04/25/2021 5:42:26 PM By: Antonieta Iba Entered By: Antonieta Iba on 04/25/2021 15:42:54 -------------------------------------------------------------------------------- Vitals Details Patient Name: Date of Service: [name]. 04/25/2021 3:30 PM Medical Record Number: [id number] Patient Account Number: [id number] Date of Birth/Sex: Treating RN: 08-11-1932 (87 y.o. [name] Primary Care Kadey Mihalic: Shirlean Mylar Other Clinician: Referring [name]: Treating Barnell Shieh/Extender: Donato Heinz in Treatment: 12 Vital Signs Time Taken: 15:37 Temperature (F): 98.6 Height (in): 60 Pulse (bpm): 81 Weight (lbs): 122 Respiratory Rate (breaths/min): 16 Body Mass Index (BMI): 23.8 Blood Pressure (mmHg): 164/85 Reference Range: 80 - 120 mg / dl Electronic Signature(s) Signed: 04/25/2021 5:42:26 PM By: Antonieta Iba Entered By: Antonieta Iba on 04/25/2021 15:40:18

## 2021-04-25 NOTE — Progress Notes (Signed)
[name] ([id number]) Visit Report for 04/25/2021 Chief Complaint Document Details Patient Name: Date of Service: [name], [name] 04/25/2021 3:30 PM Medical Record Number: [id number] Patient Account Number: [id number] Date of Birth/Sex: Treating RN: [birth year] (87 y.o. M) Primary Care Provider: Shirlean Mylar Other Clinician: Referring Provider: Treating Provider/Extender: Donato Heinz in Treatment: 12 Information Obtained from: Patient Chief Complaint Right foot wound Electronic Signature(s) Signed: 04/25/2021 4:19:33 PM By: Geralyn Corwin DO Entered By: Geralyn Corwin on 04/25/2021 16:16:05 -------------------------------------------------------------------------------- Debridement Details Patient Name: Date of Service: [name] 04/25/2021 3:30 PM Medical Record Number: [id number] Patient Account Number: [id number] Date of Birth/Sex: Treating RN: 08-29-1932 (87 y.o. [name] Primary Care Provider: Shirlean Mylar Other Clinician: Referring Provider: Treating Provider/Extender: Donato Heinz in Treatment: 12 Debridement Performed for Assessment: Wound #1 Right,Lateral Achilles Performed By: Physician Geralyn Corwin, DO Debridement Type: Debridement Level of Consciousness (Pre-procedure): Awake and Alert Pre-procedure Verification/Time Out Yes - 15:55 Taken: Start Time: 15:56 Pain Control: Other : Benzocaine T Area Debrided (L x W): otal 0.8 (cm) x 1 (cm) = 0.8 (cm) Tissue and other material debrided: Non-Viable, Slough, Subcutaneous, Slough Level: Skin/Subcutaneous Tissue Debridement Description: Excisional Instrument: Curette Bleeding: Minimum Hemostasis Achieved: Pressure End Time: 16:01 Response to Treatment: Procedure was tolerated well Level of Consciousness (Post- Awake and Alert procedure): Post Debridement Measurements of Total Wound Length: (cm) 0.8 Width: (cm) 1 Depth: (cm) 0.2 Volume:  (cm) 0.126 Character of Wound/Ulcer Post Debridement: Stable Post Procedure Diagnosis Same as Pre-procedure Electronic Signature(s) Signed: 04/25/2021 4:19:33 PM By: Geralyn Corwin DO Signed: 04/25/2021 5:42:26 PM By: Antonieta Iba Entered By: Antonieta Iba on 04/25/2021 16:01:36 -------------------------------------------------------------------------------- HPI Details Patient Name: Date of Service: [name]. 04/25/2021 3:30 PM Medical Record Number: [id number] Patient Account Number: [id number] Date of Birth/Sex: Treating RN: 03/04/1933 (87 y.o. M) Primary Care Provider: Shirlean Mylar Other Clinician: Referring Provider: Treating Provider/Extender: Donato Heinz in Treatment: 12 History of Present Illness HPI Description: Admission 01/28/2021 [name] is an 87 year old male with a past medical history of essential hypertension that presents to the clinic for a 55-month history of nonhealing wound to his right ankle. He states that he thinks the wound started From his shoes rubbing the back of his ankle. He switched shoes however the wound never healed. He is currently been keeping the area covered and applying antibiotic ointment. He denies signs of infection. 9/27; patient presents for follow-up. He has been using Santyl on the wound bed daily. He has no issues or complaints today. He denies signs of infection. 10/6; patient presents for follow-up. He has been using Santyl on the wound bed daily. He continues to have chronic pain to this area. He denies signs of infection. 10/20; patient presents for follow-up. He has been using Santyl to the wound beds daily. He has no issues or complaints today. He denies signs of infection. 10/27; patient presents for follow-up. He has been using Santyl and Hydrofera Blue to the wound beds. He denies signs of infection. 11/3; patient presents for follow-up. He has been using Iodoflex without issues. He denies  signs of infection. 11/17; patient presents for follow-up. He continues to use Iodoflex without any issues. He currently denies signs of infection. He still reports chronic pain to the area. 12/1; patient presents for follow-up. He has been using Santyl with Hydrofera Blue. He currently denies signs of infection. He reports improvement to wound healing. 12/15; patient presents for follow-up.  He continues to use Santyl with [employer]	[place]. He denies signs of infection. Electronic Signature(s) Signed: 04/25/2021 4:19:33 PM By: Geralyn Corwin DO Entered By: Geralyn Corwin on 04/25/2021 16:16:23 -------------------------------------------------------------------------------- Physical Exam Details Patient Name: Date of Service: [name] 04/25/2021 3:30 PM Medical Record Number: [id number] Patient Account Number: [id number] Date of Birth/Sex: Treating RN: 04-Mar-1933 (88 y.o. M) Primary Care Provider: Shirlean Mylar Other Clinician: Referring Provider: Treating Provider/Extender: Donato Heinz in Treatment: 12 Constitutional respirations regular, non-labored and within target range for patient.. Cardiovascular 2+ dorsalis pedis/posterior tibialis pulses. Psychiatric pleasant and cooperative. Notes Right posterior ankle: Open wound with granulation tissue, slough and fibrinous tissue. No evidence of active infection. Electronic Signature(s) Signed: 04/25/2021 4:19:33 PM By: Geralyn Corwin DO Entered By: Geralyn Corwin on 04/25/2021 16:16:48 -------------------------------------------------------------------------------- Physician Orders Details Patient Name: Date of Service: [name] 04/25/2021 3:30 PM Medical Record Number: [id number] Patient Account Number: [id number] Date of Birth/Sex: Treating RN: Nov 27, 1932 (87 y.o. 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 - Dr. Mikey Bussing Return A Bathing/ Shower/ Hygiene May shower and wash wound with soap and water. Off-Loading Other: - keep pressure off of right lateral achilles Wound Treatment Wound #1 - Achilles Wound Laterality: Right, Lateral Cleanser: Soap and Water Every Other Day/15 Days Discharge Instructions: May shower and wash wound with dial antibacterial soap and water prior to dressing change. Peri-Wound Care: Skin Prep (Generic) Every Other Day/15 Days Discharge Instructions: Use skin prep as directed Prim Dressing: Hydrofera Blue Ready Foam, 2.5 x2.5 in Every Other Day/15 Days ary Discharge Instructions: Apply Hydrofera over Santyl Prim Dressing: Santyl Ointment Every Other Day/15 Days ary Discharge Instructions: Apply nickel thick amount to wound bed as instructed Secondary Dressing: ALLEVYN Heel 4 1/2in x 5 1/2in / 10.5cm x 13.5cm Every Other Day/15 Days Discharge Instructions: Apply over primary dressing as directed. Secondary Dressing: Zetuvit Plus Silicone Border Dressing 4x4 (in/in) (Generic) Every Other Day/15 Days Discharge Instructions: Apply silicone border over primary dressing as directed. Electronic Signature(s) Signed: 04/25/2021 4:19:33 PM By: Geralyn Corwin DO Entered By: Geralyn Corwin on 04/25/2021 16:17:44 -------------------------------------------------------------------------------- Problem List Details Patient Name: Date of Service: [name]. 04/25/2021 3:30 PM Medical Record Number: [id number] Patient Account Number: [id number] Date of Birth/Sex: Treating  RN: Oct 07, 1932 (87 y.o. 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 03/28/2021 No Yes I10 Essential (primary) hypertension 01/28/2021 No Yes Inactive Problems ICD-10 Code Description Active Date Inactive Date S91.301A Unspecified open wound, right foot, initial encounter 01/28/2021 01/29/2021 Resolved Problems Electronic Signature(s) Signed: 04/25/2021 4:19:33 PM By: Geralyn Corwin DO Entered By: Geralyn Corwin on 04/25/2021 16:15:54 -------------------------------------------------------------------------------- Progress Note Details Patient Name: Date of Service: [name] 04/25/2021 3:30 PM Medical Record Number: [id number] Patient Account Number: [id number] Date of Birth/Sex: Treating RN: 1932-07-24 (88 y.o. M) Primary Care  Provider: Shirlean Mylar Other Clinician: Referring Provider: Treating Provider/Extender: Donato Heinz in Treatment: 12 Subjective Chief Complaint Information obtained from Patient Right foot wound History of Present Illness (HPI) Admission 01/28/2021 [name] is an 87 year old male with a past medical history of essential hypertension that presents to the clinic for a 29-month history of nonhealing wound to his right ankle. He states that he thinks the wound started From his shoes rubbing the back of his ankle. He switched shoes however the wound never healed. He is currently been keeping the area covered and applying antibiotic ointment. He denies signs of infection. 9/27; patient presents for follow-up. He has been using Santyl on the  wound bed daily. He has no issues or complaints today. He denies signs of infection. 10/6; patient presents for follow-up. He has been using Santyl on the wound bed daily. He continues to have chronic pain to this area. He denies signs of infection. 10/20; patient presents for follow-up. He has been using Santyl to the wound beds daily. He has no issues or complaints today. He denies signs of infection. 10/27; patient presents for follow-up. He has been using Santyl and Hydrofera Blue to the wound beds. He denies signs of infection. 11/3; patient presents for follow-up. He has been using Iodoflex without issues. He denies signs of infection. 11/17; patient presents for follow-up. He continues to use Iodoflex without any issues. He currently denies signs of infection. He still reports chronic pain to the area. 12/1; patient presents for follow-up. He has been using Santyl with Hydrofera Blue. He currently denies signs of infection. He reports improvement to wound healing. 12/15; patient presents for follow-up. He continues to use Santyl with [employer]	[place]. He denies signs of infection. Patient History Information obtained from Patient. Family History Cancer - Siblings, Diabetes - Siblings, Kidney Disease - Siblings, No family history of Heart Disease, Hereditary Spherocytosis, Hypertension, Lung Disease, Seizures, Stroke, Thyroid Problems, Tuberculosis. Social History Former smoker - quit age 62, Marital Status - Married, Alcohol Use - Never, Drug Use - No History, Caffeine Use - Daily - coffee. Medical History Eyes Patient has history of Cataracts - bil removed Denies history of Glaucoma, Optic Neuritis Ear/Nose/Mouth/Throat Denies history of Chronic sinus problems/congestion, Middle ear problems Cardiovascular Patient has history of Hypertension Musculoskeletal Patient has history of Osteoarthritis Oncologic Denies history of Received Chemotherapy, Received  Radiation Psychiatric Denies history of Anorexia/bulimia, Confinement Anxiety Hospitalization/Surgery History - bil rotator cuff surgeries. Medical A Surgical History Notes nd Ear/Nose/Mouth/Throat extremely hard of hearing Neurologic hx cervical fractures Objective Constitutional respirations regular, non-labored and within target range for patient.. Vitals Time Taken: 3:37 PM, Height: 60 in, Weight: 122 lbs, BMI: 23.8, Temperature: 98.6 F, Pulse: 81 bpm, Respiratory Rate: 16 breaths/min, Blood Pressure: 164/85 mmHg. Cardiovascular 2+ dorsalis pedis/posterior tibialis pulses. Psychiatric pleasant and cooperative. General Notes: Right posterior ankle: Open wound with granulation tissue, slough and fibrinous tissue. No evidence of active infection. Integumentary (Hair, Skin) Wound #1 status is Open. Original cause of wound was Gradually Appeared. The date acquired was: 05/16/2020. The wound has been in treatment 7 weeks. The wound is located on the Right,Lateral Achilles. The wound measures 0.8cm length x 1cm width x 0.2cm depth; 0.628cm^2 area and 0.126cm^3 volume. There is Fat Layer (Subcutaneous Tissue) exposed. There is no tunneling or undermining noted. There is a medium amount of serous drainage noted. The wound margin is distinct with the outline attached to the wound base. There is medium (34-66%) pink granulation within  the wound bed. There is a medium (34-66%) amount of necrotic tissue within the wound bed including Adherent Slough. 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 I debrided nonviable tissue. No signs of infection on exam. I recommended continuing Santyl with Hydrofera Blue. We will order heel cups to see if this helps with additional padding and protection to the  wound. Procedures Wound #1 Pre-procedure diagnosis of Wound #1 is an Atypical located on the Right,Lateral Achilles . There was a Excisional Skin/Subcutaneous Tissue Debridement with a total area of 0.8 sq cm performed by Geralyn Corwin, DO. With the following instrument(s): Curette to remove Non-Viable tissue/material. Material removed includes Subcutaneous Tissue and Slough and after achieving pain control using Other (Benzocaine). No specimens were taken. A time out was conducted at 15:55, prior to the start of the procedure. A Minimum amount of bleeding was controlled with Pressure. The procedure was tolerated well. Post Debridement Measurements: 0.8cm length x 1cm width x 0.2cm depth; 0.126cm^3 volume. Character of Wound/Ulcer Post Debridement is stable. Post procedure Diagnosis Wound #1: Same as Pre-Procedure Plan Follow-up Appointments: Return Appointment in 2 weeks. - Dr. Mikey Bussing Bathing/ Shower/ Hygiene: May shower and wash wound with soap and water. Off-Loading: Other: - keep pressure off of right lateral achilles WOUND #1: - Achilles Wound Laterality: Right, Lateral Cleanser: Soap and Water Every Other Day/15 Days Discharge Instructions: May shower and wash wound with dial antibacterial soap and water prior to dressing change. Peri-Wound Care: Skin Prep (Generic) Every Other Day/15 Days Discharge Instructions: Use skin prep as directed Prim Dressing: Hydrofera Blue Ready Foam, 2.5 x2.5 in Every Other Day/15 Days ary Discharge Instructions: Apply Hydrofera over Santyl Prim Dressing: Santyl Ointment Every Other Day/15 Days ary Discharge Instructions: Apply nickel thick amount to wound bed as instructed Secondary Dressing: ALLEVYN Heel 4 1/2in x 5 1/2in / 10.5cm x 13.5cm Every Other Day/15 Days Discharge Instructions: Apply over primary dressing as directed. Secondary Dressing: Zetuvit Plus Silicone Border Dressing 4x4 (in/in) (Generic) Every Other Day/15 Days Discharge  Instructions: Apply silicone border over primary dressing as directed. 1. Hydrofera Blue and Santyl 2. Aggressive offloading to the wound bed 3. Follow-up in 2 weeks Electronic Signature(s) Signed: 04/25/2021 4:19:33 PM By: Geralyn Corwin DO Entered By: Geralyn Corwin on 04/25/2021 16:19:03 -------------------------------------------------------------------------------- HxROS Details Patient Name: Date of Service: [name]. 04/25/2021 3:30 PM Medical Record Number: [id number] Patient Account Number: [id number] Date of Birth/Sex: Treating RN: 1932/10/09 (87 y.o. M) Primary Care Provider: Other Clinician: Shirlean Mylar Referring Provider: Treating Provider/Extender: Donato Heinz in Treatment: 12 Information Obtained From Patient Eyes Medical History: Positive for: Cataracts - bil removed Negative for: Glaucoma; Optic Neuritis Ear/Nose/Mouth/Throat Medical History: Negative for: Chronic sinus problems/congestion; Middle ear problems Past Medical History Notes: extremely hard of hearing Cardiovascular Medical History: Positive for: Hypertension Musculoskeletal Medical History: Positive for: Osteoarthritis Neurologic Medical History: Past Medical History Notes: hx cervical fractures Oncologic Medical History: Negative for: Received Chemotherapy; Received Radiation Psychiatric Medical History: Negative for: Anorexia/bulimia; Confinement Anxiety HBO Extended History Items Eyes: Cataracts Immunizations Pneumococcal Vaccine: Received Pneumococcal Vaccination: Yes Received Pneumococcal Vaccination On or After 60th Birthday: Yes Implantable Devices None Hospitalization / Surgery History Type of Hospitalization/Surgery bil rotator cuff surgeries Family and Social History Cancer: Yes - Siblings; Diabetes: Yes - Siblings; Heart Disease: No; Hereditary Spherocytosis: No; Hypertension: No; Kidney  Disease: Yes - Siblings; Lung Disease: No;  Seizures: No; Stroke: No; Thyroid Problems: No; Tuberculosis: No; Former smoker - quit age 58; Marital Status - Married; Alcohol Use: Never; Drug Use: No History; Caffeine Use: Daily - coffee; Financial Concerns: No; Food, Clothing or Shelter Needs: No; Support System Lacking: No; Transportation Concerns: No Electronic Signature(s) Signed: 04/25/2021 4:19:33 PM By: Geralyn Corwin DO Entered By: Geralyn Corwin on 04/25/2021 16:16:29 -------------------------------------------------------------------------------- SuperBill Details Patient Name: Date of Service: [name] 04/25/2021 Medical Record Number: [id number] Patient Account Number: [id number] Date of Birth/Sex: Treating RN: [birth year] (87 y.o. 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 I10 Essential (primary) hypertension Facility Procedures CPT4 Code: [id number] Description: 11042 - DEB SUBQ TISSUE 20 SQ CM/< ICD-10 Diagnosis Description L97.518 Non-pressure chronic ulcer of other part of right foot with other specified sev Modifier: erity Quantity: 1 Physician Procedures : CPT4 Code Description Modifier [id number] 11042 - WC PHYS SUBQ TISS 20 SQ CM ICD-10 Diagnosis Description L97.518 Non-pressure chronic ulcer of other part of right foot with other specified severity Quantity: 1 Electronic Signature(s) Signed: 04/25/2021 4:19:33 PM By: Geralyn Corwin DO Entered By: Geralyn Corwin on 04/25/2021 16:19:13

## 2021-05-03 DIAGNOSIS — H353123 Nonexudative age-related macular degeneration, left eye, advanced atrophic without subfoveal involvement: Secondary | ICD-10-CM | POA: Diagnosis not present

## 2021-05-03 DIAGNOSIS — H353211 Exudative age-related macular degeneration, right eye, with active choroidal neovascularization: Secondary | ICD-10-CM | POA: Diagnosis not present

## 2021-05-03 DIAGNOSIS — Z961 Presence of intraocular lens: Secondary | ICD-10-CM | POA: Diagnosis not present

## 2021-05-03 DIAGNOSIS — H04123 Dry eye syndrome of bilateral lacrimal glands: Secondary | ICD-10-CM | POA: Diagnosis not present

## 2021-05-16 ENCOUNTER — Other Ambulatory Visit: Payer: Self-pay

## 2021-05-16 ENCOUNTER — Encounter (HOSPITAL_BASED_OUTPATIENT_CLINIC_OR_DEPARTMENT_OTHER): Payer: Medicare Other | Attending: Internal Medicine | Admitting: Internal Medicine

## 2021-05-16 DIAGNOSIS — L97518 Non-pressure chronic ulcer of other part of right foot with other specified severity: Secondary | ICD-10-CM | POA: Diagnosis not present

## 2021-05-16 DIAGNOSIS — Z87891 Personal history of nicotine dependence: Secondary | ICD-10-CM | POA: Insufficient documentation

## 2021-05-16 DIAGNOSIS — I83013 Varicose veins of right lower extremity with ulcer of ankle: Secondary | ICD-10-CM | POA: Diagnosis not present

## 2021-05-16 DIAGNOSIS — M1990 Unspecified osteoarthritis, unspecified site: Secondary | ICD-10-CM | POA: Diagnosis not present

## 2021-05-16 DIAGNOSIS — I10 Essential (primary) hypertension: Secondary | ICD-10-CM | POA: Diagnosis not present

## 2021-05-16 NOTE — Progress Notes (Signed)
Jobe MarkerMEEKS, Raynell J. ([id number]) Visit Report for 05/16/2021 Arrival Information Details Patient Name: Date of Service: [name] ChimeraMEEKS, O LIN J. 05/16/2021 2:30 PM Medical Record Number: [id number] Patient Account Number: [id number] Date of Birth/Sex: Treating RN: [birth year] (88 y.o. [name] LimboM) Scotton, Joanne Primary Care Zealand Boyett: Shirlean MylarWebb, Carol Other Clinician: Referring Lakeyia Surber: Treating Kenneshia Rehm/Extender: Donato HeinzHoffman, Jessica Webb, Carol Weeks in Treatment: 15 Visit Information History Since Last Visit Added or deleted any medications: No Patient Arrived: Ambulatory Any new allergies or adverse reactions: No Arrival Time: 14:48 Had a fall or experienced change in No Accompanied By: spouse activities of daily living that may affect Transfer Assistance: None risk of falls: Patient Requires Transmission-Based Precautions: No Signs or symptoms of abuse/neglect since last visito No Patient Has Alerts: Yes Hospitalized since last visit: No Patient Alerts: R ABI=1.12, L ABI=1.13 Implantable device outside of the clinic excluding No cellular tissue based products placed in the center since last visit: Has Dressing in Place as Prescribed: Yes Pain Present Now: No Electronic Signature(s) Signed: 05/16/2021 5:28:24 PM By: Karie SchwalbeScotton, Joanne RN Entered By: Karie SchwalbeScotton, Joanne on 05/16/2021 14:50:22 -------------------------------------------------------------------------------- Clinic Level of Care Assessment Details Patient Name: Date of Service: [name] ChimeraMEEKS, O LIN J. 05/16/2021 2:30 PM Medical Record Number: [id number] Patient Account Number: [id number] Date of Birth/Sex: Treating RN: [birth year] (88 y.o. [name] MichaelsM) Barnhart, Jodi Primary Care Deirdre Gryder: Shirlean MylarWebb, Carol Other Clinician: Referring Trumaine Wimer: Treating Nena Hampe/Extender: Donato HeinzHoffman, Jessica Webb, Carol Weeks in Treatment: 15 Clinic Level of Care Assessment Items TOOL 4 Quantity Score X- 1 0 Use when only an EandM is performed on FOLLOW-UP visit ASSESSMENTS - Nursing  Assessment / Reassessment X- 1 10 Reassessment of Co-morbidities (includes updates in patient status) X- 1 5 Reassessment of Adherence to Treatment Plan ASSESSMENTS - Wound and Skin A ssessment / Reassessment X - Simple Wound Assessment / Reassessment - one wound 1 5 []  - 0 Complex Wound Assessment / Reassessment - multiple wounds []  - 0 Dermatologic / Skin Assessment (not related to wound area) ASSESSMENTS - Focused Assessment []  - 0 Circumferential Edema Measurements - multi extremities []  - 0 Nutritional Assessment / Counseling / Intervention []  - 0 Lower Extremity Assessment (monofilament, tuning fork, pulses) []  - 0 Peripheral Arterial Disease Assessment (using hand held doppler) ASSESSMENTS - Ostomy and/or Continence Assessment and Care []  - 0 Incontinence Assessment and Management []  - 0 Ostomy Care Assessment and Management (repouching, etc.) PROCESS - Coordination of Care []  - 0 Simple Patient / Family Education for ongoing care X- 1 20 Complex (extensive) Patient / Family Education for ongoing care []  - 0 Staff obtains ChiropractorConsents, Records, T Results / Process Orders est []  - 0 Staff telephones HHA, Nursing Homes / Clarify orders / etc []  - 0 Routine Transfer to another Facility (non-emergent condition) []  - 0 Routine Hospital Admission (non-emergent condition) []  - 0 New Admissions / Manufacturing engineernsurance Authorizations / Ordering NPWT Apligraf, etc. , []  - 0 Emergency Hospital Admission (emergent condition) []  - 0 Simple Discharge Coordination []  - 0 Complex (extensive) Discharge Coordination PROCESS - Special Needs []  - 0 Pediatric / Minor Patient Management []  - 0 Isolation Patient Management []  - 0 Hearing / Language / Visual special needs []  - 0 Assessment of Community assistance (transportation, D/C planning, etc.) []  - 0 Additional assistance / Altered mentation []  - 0 Support Surface(s) Assessment (bed, cushion, seat, etc.) INTERVENTIONS - Wound  Cleansing / Measurement X - Simple Wound Cleansing - one wound 1 5 []  - 0 Complex Wound Cleansing - multiple wounds X- 1 5 Wound Imaging (  photographs - any number of wounds) []  - 0 Wound Tracing (instead of photographs) X- 1 5 Simple Wound Measurement - one wound []  - 0 Complex Wound Measurement - multiple wounds INTERVENTIONS - Wound Dressings X - Small Wound Dressing one or multiple wounds 1 10 []  - 0 Medium Wound Dressing one or multiple wounds []  - 0 Large Wound Dressing one or multiple wounds []  - 0 Application of Medications - topical []  - 0 Application of Medications - injection INTERVENTIONS - Miscellaneous []  - 0 External ear exam []  - 0 Specimen Collection (cultures, biopsies, blood, body fluids, etc.) []  - 0 Specimen(s) / Culture(s) sent or taken to Lab for analysis []  - 0 Patient Transfer (multiple staff / / Similar devices) []  - 0 Simple Staple / Suture removal (25 or less) []  - 0 Complex Staple / Suture removal (26 or more) []  - 0 Hypo / Hyperglycemic Management (close monitor of Blood Glucose) []  - 0 Ankle / Brachial Index (ABI) - do not check if billed separately X- 1 5 Vital Signs Has the patient been seen at the hospital within the last three years: Yes Total Score: 70 Level Of Care: New/Established - Level 2 Electronic Signature(s) Signed: 05/16/2021 5:38:48 PM By: Entered By: on 05/16/2021 15:20:08 -------------------------------------------------------------------------------- Encounter Discharge Information Details Patient Name: Date of Service: 05/16/2021 2:30 PM Medical Record Number: Patient Account Number: Date of Birth/Sex: Treating RN: May 20, 1932 (88 y.o. Primary Care [name]: Other Clinician: Referring Taron Conrey: Treating Mirtie Bastyr/Extender: in Treatment: 15 Encounter Discharge Information  Items Discharge Condition: Stable Ambulatory Status: Ambulatory Discharge Destination: Home Transportation: Private Auto Accompanied By: wife Schedule Follow-up Appointment: Yes Clinical Summary of Care: Provided on 05/16/2021 Form Type Recipient Paper Patient Patient Electronic Signature(s) Signed: 05/16/2021 5:38:48 PM By: Antonieta Iba Entered By: Antonieta Iba on 05/16/2021 15:21:13 -------------------------------------------------------------------------------- Lower Extremity Assessment Details Patient Name: Date of Service: [name], [name] 05/16/2021 2:30 PM Medical Record Number: [id number] Patient Account Number: [id number] Date of Birth/Sex: Treating RN: 30-May-1932 (88 y.o. [name] Primary Care Lyle Niblett: Shirlean Mylar Other Clinician: Referring Cherylin Waguespack: Treating Karie Skowron/Extender: Donato Heinz in Treatment: 15 Edema Assessment Assessed: [Left: No] [Right: No] Edema: [Left: N] [Right: o] Calf Left: Right: Point of Measurement: From Medial Instep 30 cm Ankle Left: Right: Point of Measurement: From Medial Instep 19 cm Electronic Signature(s) Signed: 05/16/2021 5:28:24 PM By: 07/14/2021 RN Entered By: Antonieta Iba on 05/16/2021 14:55:06 -------------------------------------------------------------------------------- Multi Wound Chart Details Patient Name: Date of Service: 07/14/2021. 05/16/2021 2:30 PM Medical Record Number: 07/14/2021 Patient Account Number: [id number] Date of Birth/Sex: Treating RN: February 26, 1933 (88 y.o. 88 Primary Care Colletta Spillers: [name] Other Clinician: Referring Tiasia Weberg: Treating Alice Vitelli/Extender: Shirlean Mylar in Treatment: 15 Vital Signs Height(in): 60 Pulse(bpm): 91 Weight(lbs): 122 Blood Pressure(mmHg): 142/91 Body Mass Index(BMI): 24 Temperature(F): 98.6 Respiratory Rate(breaths/min): 16 Photos: [N/A:N/A] Right, Lateral Achilles N/A N/A Wound  Location: Gradually Appeared N/A N/A Wounding Event: Atypical N/A N/A Primary Etiology: Cataracts, Hypertension, N/A N/A Comorbid History: Osteoarthritis 05/16/2020 N/A N/A Date Acquired: 10 N/A N/A Weeks of Treatment: Open N/A N/A Wound Status: 1x0.9x0.2 N/A N/A Measurements L x W x D (cm) 0.707 N/A N/A A (cm) : rea 0.141 N/A N/A Volume (cm) : 42.30% N/A N/A % Reduction in Area: 71.20% N/A N/A % Reduction in Volume: Full Thickness Without Exposed N/A N/A Classification:  Support Structures Medium N/A N/A Exudate Amount: Serous N/A N/A Exudate Type: amber N/A N/A Exudate Color: Distinct, outline attached N/A N/A Wound Margin: Large (67-100%) N/A N/A Granulation Amount: Pink N/A N/A Granulation Quality: Small (1-33%) N/A N/A Necrotic Amount: Fat Layer (Subcutaneous Tissue): Yes N/A N/A Exposed Structures: Fascia: No Tendon: No Muscle: No Joint: No Bone: No Medium (34-66%) N/A N/A Epithelialization: Treatment Notes Wound #1 (Achilles) Wound Laterality: Right, Lateral Cleanser Soap and Water Discharge Instruction: May shower and wash wound with dial antibacterial soap and water prior to dressing change. Peri-Wound Care Skin Prep Discharge Instruction: Use skin prep as directed Topical Gentamicin Discharge Instruction: As directed by physician Primary Dressing Hydrofera Blue Ready Foam, 2.5 x2.5 in Discharge Instruction: Apply Hydrofera over Santyl Secondary Dressing Woven Gauze Sponge, Non-Sterile 4x4 in Discharge Instruction: Apply over primary dressing as directed. Secured With Conforming Stretch Gauze Bandage, Sterile 2x75 (in/in) Discharge Instruction: Secure with stretch gauze as directed. Transpore Surgical Tape, 2x10 (in/yd) Discharge Instruction: Secure dressing with tape as directed. Compression Wrap Compression Stockings Add-Ons Electronic Signature(s) Signed: 05/16/2021 3:38:30 PM By: Geralyn Corwin DO Signed: 05/16/2021 5:48:30 PM  By: Zandra Abts RN, BSN Entered By: Geralyn Corwin on 05/16/2021 15:33:00 -------------------------------------------------------------------------------- Multi-Disciplinary Care Plan Details Patient Name: Date of Service: [name]. 05/16/2021 2:30 PM Medical Record Number: [id number] Patient Account Number: [id number] Date of Birth/Sex: Treating RN: [birth year] (88 y.o. [name] Primary Care Vander Kueker: Shirlean Mylar Other Clinician: Referring Misti Towle: Treating Kanasia Gayman/Extender: Donato Heinz in Treatment: 15 Multidisciplinary Care Plan reviewed with physician Active Inactive Wound/Skin Impairment Nursing Diagnoses: Impaired tissue integrity Knowledge deficit related to ulceration/compromised skin integrity Goals: Patient/caregiver will verbalize understanding of skin care regimen Date Initiated: 01/28/2021 Target Resolution Date: 06/20/2021 Goal Status: Active Interventions: Assess patient/caregiver ability to obtain necessary supplies Assess patient/caregiver ability to perform ulcer/skin care regimen upon admission and as needed Assess ulceration(s) every visit Provide education on ulcer and skin care Notes: 03/07/21: Patient/Caregiver performing dressing changes, new treatment this week. Electronic Signature(s) Signed: 05/16/2021 5:38:48 PM By: Antonieta Iba Entered By: Antonieta Iba on 05/16/2021 15:07:26 -------------------------------------------------------------------------------- Pain Assessment Details Patient Name: Date of Service: [name], [name] 05/16/2021 2:30 PM Medical Record Number: [id number] Patient Account Number: [id number] Date of Birth/Sex: Treating RN: [birth year] (88 y.o. [name] Primary Care Nihaal Friesen: Shirlean Mylar Other Clinician: Referring Pace Lamadrid: Treating Daliyah Sramek/Extender: Donato Heinz in Treatment: 15 Active Problems Location of Pain Severity and Description of  Pain Patient Has Paino No Site Locations Pain Management and Medication Current Pain Management: Electronic Signature(s) Signed: 05/16/2021 5:28:24 PM By: Karie Schwalbe RN Entered By: Karie Schwalbe on 05/16/2021 14:53:14 -------------------------------------------------------------------------------- Patient/Caregiver Education Details Patient Name: Date of Service: [name] 1/5/2023andnbsp2:30 PM Medical Record Number: [id number] Patient Account Number: [id number] Date of Birth/Gender: Treating RN: October 24, 1932 (88 y.o. [name] Primary Care Physician: Shirlean Mylar Other Clinician: Referring Physician: Treating Physician/Extender: Donato Heinz in Treatment: 15 Education Assessment Education Provided To: Patient Education Topics Provided Offloading: Methods: Explain/Verbal, Printed Responses: State content correctly Wound/Skin Impairment: Methods: Explain/Verbal, Printed Responses: State content correctly Electronic Signature(s) Signed: 05/16/2021 5:38:48 PM By: Antonieta Iba Entered By: Antonieta Iba on 05/16/2021 15:07:57 -------------------------------------------------------------------------------- Wound Assessment Details Patient Name: Date of Service: [name] 05/16/2021 2:30 PM Medical Record Number: [id number] Patient Account Number: [id number] Date of Birth/Sex: Treating RN: 12-21-1932 (88 y.o. [name] Primary Care Yohana Bartha: Shirlean Mylar Other Clinician: Referring Annaleah Arata: Treating Arleene Settle/Extender: Ivin Booty,  Dellia Nimsarol Weeks in Treatment: 15 Wound Status Wound Number: 1 Primary Etiology: Atypical Wound Location: Right, Lateral Achilles Wound Status: Open Wounding Event: Gradually Appeared Comorbid History: Cataracts, Hypertension, Osteoarthritis Date Acquired: 05/16/2020 Weeks Of Treatment: 10 Clustered Wound: No Photos Wound Measurements Length: (cm) 1 Width: (cm) 0.9 Depth: (cm)  0.2 Area: (cm) 0.707 Volume: (cm) 0.141 % Reduction in Area: 42.3% % Reduction in Volume: 71.2% Epithelialization: Medium (34-66%) Tunneling: No Undermining: No Wound Description Classification: Full Thickness Without Exposed Support Structures Wound Margin: Distinct, outline attached Exudate Amount: Medium Exudate Type: Serous Exudate Color: amber Foul Odor After Cleansing: No Slough/Fibrino Yes Wound Bed Granulation Amount: Large (67-100%) Exposed Structure Granulation Quality: Pink Fascia Exposed: No Necrotic Amount: Small (1-33%) Fat Layer (Subcutaneous Tissue) Exposed: Yes Necrotic Quality: Adherent Slough Tendon Exposed: No Muscle Exposed: No Joint Exposed: No Bone Exposed: No Treatment Notes Wound #1 (Achilles) Wound Laterality: Right, Lateral Cleanser Soap and Water Discharge Instruction: May shower and wash wound with dial antibacterial soap and water prior to dressing change. Peri-Wound Care Skin Prep Discharge Instruction: Use skin prep as directed Topical Gentamicin Discharge Instruction: As directed by physician Primary Dressing Hydrofera Blue Ready Foam, 2.5 x2.5 in Discharge Instruction: Apply Hydrofera over Santyl Secondary Dressing Woven Gauze Sponge, Non-Sterile 4x4 in Discharge Instruction: Apply over primary dressing as directed. Secured With Conforming Stretch Gauze Bandage, Sterile 2x75 (in/in) Discharge Instruction: Secure with stretch gauze as directed. Transpore Surgical Tape, 2x10 (in/yd) Discharge Instruction: Secure dressing with tape as directed. Compression Wrap Compression Stockings Add-Ons Electronic Signature(s) Signed: 05/16/2021 5:28:24 PM By: Karie SchwalbeScotton, Joanne RN Entered By: Karie SchwalbeScotton, Joanne on 05/16/2021 14:58:58 -------------------------------------------------------------------------------- Vitals Details Patient Name: Date of Service: [name] ChimeraMEEKS, O LIN J. 05/16/2021 2:30 PM Medical Record Number: [id number] Patient Account  Number: [id number] Date of Birth/Sex: Treating RN: 1932/10/02 (88 y.o. [name] LimboM) Scotton, Joanne Primary Care Ebony Rickel: Shirlean MylarWebb, Carol Other Clinician: Referring Briceson Broadwater: Treating Mariateresa Batra/Extender: Donato HeinzHoffman, Jessica Webb, Carol Weeks in Treatment: 15 Vital Signs Time Taken: 14:50 Temperature (F): 98.6 Height (in): 60 Pulse (bpm): 91 Weight (lbs): 122 Respiratory Rate (breaths/min): 16 Body Mass Index (BMI): 23.8 Blood Pressure (mmHg): 142/91 Reference Range: 80 - 120 mg / dl Electronic Signature(s) Signed: 05/16/2021 5:28:24 PM By: Karie SchwalbeScotton, Joanne RN Signed: 05/16/2021 5:28:24 PM By: Karie SchwalbeScotton, Joanne RN Entered By: Karie SchwalbeScotton, Joanne on 05/16/2021 14:53:05

## 2021-05-16 NOTE — Progress Notes (Signed)
[name], [name] ([id number]) Visit Report for 05/16/2021 Chief Complaint Document Details Patient Name: Date of Service: [name], [name] 05/16/2021 2:30 PM Medical Record Number: [id number] Patient Account Number: [id number] Date of Birth/Sex: Treating RN: 10-25-1932 (88 y.o. [name] Primary Care Provider: Maurice [name] Other Clinician: Referring Provider: Treating Provider/Extender: [name] in Treatment: 15 Information Obtained from: Patient Chief Complaint Right foot wound Electronic Signature(s) Signed: 05/16/2021 3:38:30 PM By: [name] Shan DO Entered By: [name] on 05/16/2021 15:33:12 -------------------------------------------------------------------------------- HPI Details Patient Name: Date of Service: [name] 05/16/2021 2:30 PM Medical Record Number: [id number] Patient Account Number: [id number] Date of Birth/Sex: Treating RN: 01/15/1933 (88 y.o. [name] Primary Care Provider: Maurice [name] Other Clinician: Referring Provider: Treating Provider/Extender: [name] in Treatment: 15 History of Present Illness HPI Description: Admission 01/28/2021 [name] is an 88 year old male with a past medical history of essential hypertension that presents to the clinic for a 11-month history of nonhealing wound to his right ankle. He states that he thinks the wound started From his shoes rubbing the back of his ankle. He switched shoes however the wound never healed. He is currently been keeping the area covered and applying antibiotic ointment. He denies signs of infection. 9/27; patient presents for follow-up. He has been using Santyl on the wound bed daily. He has no issues or complaints today. He denies signs of infection. 10/6; patient presents for follow-up. He has been using Santyl on the wound bed daily. He continues to have chronic pain to this area. He denies signs of infection. 10/20;  patient presents for follow-up. He has been using Santyl to the wound beds daily. He has no issues or complaints today. He denies signs of infection. 10/27; patient presents for follow-up. He has been using Santyl and Hydrofera Blue to the wound beds. He denies signs of infection. 11/3; patient presents for follow-up. He has been using Iodoflex without issues. He denies signs of infection. 11/17; patient presents for follow-up. He continues to use Iodoflex without any issues. He currently denies signs of infection. He still reports chronic pain to the area. 12/1; patient presents for follow-up. He has been using Santyl with Hydrofera Blue. He currently denies signs of infection. He reports improvement to wound healing. 12/15; patient presents for follow-up. He continues to use Santyl with [employer]. He denies signs of infection. 1/5; patient presents for follow-up. He is using Santyl with Hydrofera Blue. He continues to have chronic pain to the wound site. Electronic Signature(s) Signed: 05/16/2021 3:38:30 PM By: [name] Shan DO Signed: 05/16/2021 3:38:30 PM By: [name] Shan DO Entered By: [name] on 05/16/2021 15:33:38 -------------------------------------------------------------------------------- Physical Exam Details Patient Name: Date of Service: [name] 05/16/2021 2:30 PM Medical Record Number: [id number] Patient Account Number: [id number] Date of Birth/Sex: Treating RN: 1932/08/31 (88 y.o. [name] Primary Care Provider: Maurice [name] Other Clinician: Referring Provider: Treating Provider/Extender: [name] in Treatment: 15 Constitutional respirations regular, non-labored and within target range for patient.. Cardiovascular 2+ dorsalis pedis/posterior tibialis pulses. Psychiatric pleasant and cooperative. Notes Right posterior ankle: Open wound with granulation tissue, slough and fibrinous tissue. No evidence of active  infection. Electronic Signature(s) Signed: 05/16/2021 3:38:30 PM By: [name] Shan DO Entered By: [name] on 05/16/2021 15:34:07 -------------------------------------------------------------------------------- Physician Orders Details Patient Name: Date of Service: [name] 05/16/2021 2:30 PM Medical Record Number: [id number] Patient Account Number: [id number] Date of Birth/Sex: Treating  RN: March 05, 1933 (88 y.o. 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 - Dr. Heber [place] Return A Other: - Prescription for Gentamicin Cream sent to pharmacy. Bathing/ Shower/ Hygiene May shower and wash wound with soap and water. Off-Loading Other: - keep pressure off of right lateral achilles Wound Treatment Wound #1 - Achilles Wound Laterality: Right, Lateral Cleanser: Soap and Water Every Other Day/15 Days Discharge Instructions: May shower and wash wound with dial antibacterial soap and water prior to dressing change. Peri-Wound Care: Skin Prep (Generic) Every Other Day/15 Days Discharge Instructions: Use skin prep as directed Topical: Gentamicin Every Other Day/15 Days Discharge Instructions: As directed by physician Prim Dressing: Hydrofera Blue Ready Foam, 2.5 x2.5 in Every Other Day/15 Days ary Discharge Instructions: Apply Hydrofera over Santyl Secondary Dressing: Woven Gauze Sponge, Non-Sterile 4x4 in Every Other Day/15 Days Discharge Instructions: Apply over primary dressing as directed. Secured With: Child psychotherapist,  Sterile 2x75 (in/in) Every Other Day/15 Days Discharge Instructions: Secure with stretch gauze as directed. Secured With: Transpore Surgical Tape, 2x10 (in/yd) Every Other Day/15 Days Discharge Instructions: Secure dressing with tape as directed. Patient Medications llergies: prednisone A Notifications Medication Indication Start End gentamicin DOSE 1 - topical 0.1 % cream - 1 application daily Electronic Signature(s) Signed: 05/16/2021 3:35:46 PM By: [name] Shan DO Entered By: [name] on 05/16/2021 15:35:46 -------------------------------------------------------------------------------- Problem List Details Patient Name: Date of Service: [name]. 05/16/2021 2:30 PM Medical Record Number: [id number] Patient Account Number: [id number] Date of Birth/Sex: Treating RN: 1933/03/19 (88 y.o. 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 03/28/2021 No Yes I10 Essential (primary) hypertension 01/28/2021 No Yes Inactive Problems ICD-10 Code Description Active Date Inactive Date S91.301A Unspecified open wound, right foot, initial encounter 01/28/2021 01/29/2021 Resolved Problems Electronic Signature(s) Signed: 05/16/2021 3:38:30 PM By: [name] Shan DO Entered By: [name] on 05/16/2021 15:32:27 -------------------------------------------------------------------------------- Progress Note Details Patient Name: Date of Service: [name] 05/16/2021 2:30 PM Medical Record Number: [id number] Patient Account Number: [id number] Date of Birth/Sex: Treating RN: July 26, 1932 (88 y.o. [name] Primary Care Provider: Maurice [name] Other Clinician: Referring Provider: Treating Provider/Extender: [name] in Treatment: 15 Subjective Chief Complaint Information obtained from Patient Right foot wound History of Present Illness (HPI) Admission 01/28/2021 [name] is an 88 year old male with a past medical history of essential hypertension that presents to the clinic for a 15-month history of nonhealing wound to his right ankle. He states that he thinks the wound started From his shoes rubbing the back of his ankle. He switched shoes however the wound never healed. He is currently been keeping the area covered and applying antibiotic ointment. He denies signs of infection. 9/27; patient presents for follow-up. He has been using Santyl on the wound bed daily. He has no issues or complaints today. He denies  signs of infection. 10/6; patient presents for follow-up. He has been using Santyl on the wound bed daily. He continues to have chronic pain to this area. He denies signs of infection. 10/20; patient presents for follow-up. He has been using Santyl to the wound beds daily. He has no issues or complaints today. He denies signs of infection. 10/27; patient presents for follow-up. He has been using Santyl and Hydrofera Blue to the wound beds. He denies signs of infection. 11/3; patient presents for follow-up. He has been using Iodoflex without issues. He denies signs of infection. 11/17; patient presents for follow-up. He continues to use Iodoflex without any issues. He currently denies signs of infection. He still reports chronic pain to the area. 12/1; patient presents for follow-up. He has been using Santyl with Hydrofera Blue. He currently denies signs of infection. He reports improvement to wound healing. 12/15; patient presents for follow-up. He continues to use Santyl with [employer]	[place]. He denies signs of infection. 1/5; patient presents  for follow-up. He is using Santyl with Hydrofera Blue. He continues to have chronic pain to the wound site. Patient History Information obtained from Patient. Family History Cancer - Siblings, Diabetes - Siblings, Kidney Disease - Siblings, No family history of Heart Disease, Hereditary Spherocytosis, Hypertension, Lung Disease, Seizures, Stroke, Thyroid Problems, Tuberculosis. Social History Former smoker - quit age 59, Marital Status - Married, Alcohol Use - Never, Drug Use - No History, Caffeine Use - Daily - coffee. Medical History Eyes Patient has history of Cataracts - bil removed Denies history of Glaucoma, Optic Neuritis Ear/Nose/Mouth/Throat Denies history of Chronic sinus problems/congestion, Middle ear problems Cardiovascular Patient has history of Hypertension Musculoskeletal Patient has history of Osteoarthritis Oncologic Denies history of Received Chemotherapy, Received Radiation Psychiatric Denies history of Anorexia/bulimia, Confinement Anxiety Hospitalization/Surgery History - bil rotator cuff surgeries. Medical A Surgical History Notes nd Ear/Nose/Mouth/Throat extremely hard of hearing Neurologic hx cervical fractures Objective Constitutional respirations regular, non-labored and within target range for patient.. Vitals Time Taken: 2:50 PM, Height: 60 in, Weight: 122 lbs, BMI: 23.8, Temperature: 98.6 F, Pulse: 91 bpm, Respiratory Rate: 16 breaths/min, Blood Pressure: 142/91 mmHg. Cardiovascular 2+ dorsalis pedis/posterior tibialis pulses. Psychiatric pleasant and cooperative. General Notes: Right posterior ankle: Open wound with granulation tissue, slough and fibrinous tissue. No evidence of active infection. Integumentary (Hair, Skin) Wound #1 status is Open. Original cause of wound was Gradually Appeared. The date acquired was: 05/16/2020. The wound has been in treatment 10 weeks. The wound is located on the Right,Lateral Achilles. The wound measures 1cm  length x 0.9cm width x 0.2cm depth; 0.707cm^2 area and 0.141cm^3 volume. There is Fat Layer (Subcutaneous Tissue) exposed. There is no tunneling or undermining noted. There is a medium amount of serous drainage noted. The wound margin is distinct with the outline attached to the wound base. There is large (67-100%) pink granulation within the wound bed. There is a [name] (1-33%) amount of necrotic tissue within the wound bed including Adherent Slough. 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 No signs of surrounding infection. There is more granulation tissue present compared to admission. However, nonviable tissue is tightly adhered and I would like to try gentamicin to see if there is any bioburden delaying wound healing. I recommended using this with Hydrofera Blue and stopping Santyl. Plan Follow-up Appointments: Return Appointment in 2 weeks. -  Dr. Heber [place] Other: - Prescription for Gentamicin Cream sent to pharmacy. Bathing/ Shower/ Hygiene: May shower and wash wound with soap and water. Off-Loading: Other: - keep pressure off of right lateral achilles The following medication(s) was prescribed: gentamicin topical 0.1 % cream 1 1 application daily WOUND #1: - Achilles Wound Laterality: Right, Lateral Cleanser: Soap and Water Every Other Day/15 Days Discharge Instructions: May shower and wash wound with dial antibacterial soap and water prior to dressing change. Peri-Wound Care: Skin Prep (Generic) Every Other Day/15 Days Discharge Instructions: Use skin prep as directed Topical: Gentamicin Every Other Day/15 Days Discharge Instructions: As directed by physician Prim Dressing: Hydrofera Blue Ready Foam, 2.5 x2.5 in Every Other Day/15 Days ary Discharge Instructions: Apply Hydrofera over  Santyl Secondary Dressing: Woven Gauze Sponge, Non-Sterile 4x4 in Every Other Day/15 Days Discharge Instructions: Apply over primary dressing as directed. Secured With: Child psychotherapist, Sterile 2x75 (in/in) Every Other Day/15 Days Discharge Instructions: Secure with stretch gauze as directed. Secured With: Transpore Surgical Tape, 2x10 (in/yd) Every Other Day/15 Days Discharge Instructions: Secure dressing with tape as directed. 1. Gentamicin and Hydrofera Blue 2. Follow-up in 2 weeks Electronic Signature(s) Signed: 05/16/2021 3:38:30 PM By: [name] Shan DO Entered By: [name] on 05/16/2021 15:37:54 -------------------------------------------------------------------------------- HxROS Details Patient Name: Date of Service: [name]. 05/16/2021 2:30 PM Medical Record Number: [id number] Patient Account Number: [id number] Date of Birth/Sex: Treating RN: February 18, 1933 (88 y.o. [name] Primary Care Provider: Maurice [name] Other Clinician: Referring Provider: Treating Provider/Extender: [name] in Treatment: 15 Information Obtained From Patient Eyes Medical History: Positive for: Cataracts - bil removed Negative for: Glaucoma; Optic Neuritis Ear/Nose/Mouth/Throat Medical History: Negative for: Chronic sinus problems/congestion; Middle ear problems Past Medical History Notes: extremely hard of hearing Cardiovascular Medical History: Positive for: Hypertension Musculoskeletal Medical History: Positive for: Osteoarthritis Neurologic Medical History: Past Medical History Notes: hx cervical fractures Oncologic Medical History: Negative for: Received Chemotherapy; Received Radiation Psychiatric Medical History: Negative for: Anorexia/bulimia; Confinement Anxiety HBO Extended History Items Eyes: Cataracts Immunizations Pneumococcal Vaccine: Received Pneumococcal Vaccination: Yes Received Pneumococcal  Vaccination On or After 60th Birthday: Yes Implantable Devices None Hospitalization / Surgery History Type of Hospitalization/Surgery bil rotator cuff surgeries Family and Social History Cancer: Yes - Siblings; Diabetes: Yes - Siblings; Heart Disease: No; Hereditary Spherocytosis: No; Hypertension: No; Kidney Disease: Yes - Siblings; Lung Disease: No; Seizures: No; Stroke: No; Thyroid Problems: No; Tuberculosis: No; Former smoker - quit age 36; Marital Status - Married; Alcohol Use: Never; Drug Use: No History; Caffeine Use: Daily - coffee; Financial Concerns: No; Food, Clothing or Shelter Needs: No; Support System Lacking: No; Transportation Concerns: No Electronic Signature(s) Signed: 05/16/2021 3:38:30 PM By: [name] Shan DO Signed: 05/16/2021 5:48:30 PM By: Levan Hurst RN, BSN Entered By: [name] on 05/16/2021 15:33:46 -------------------------------------------------------------------------------- SuperBill Details Patient Name: Date of Service: [name] 05/16/2021 Medical Record Number: [id number] Patient Account Number: [id number] Date of Birth/Sex: Treating RN: Mar 19, 1933 (88 y.o. 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 I10 Essential (primary) hypertension Facility Procedures CPT4 Code: [id number] Description: [phone] - WOUND CARE VISIT-LEV 2 EST PT Modifier: Quantity: 1 Physician Procedures : CPT4 Code Description Modifier [id number] - WC PHYS LEVEL 3 -  EST PT ICD-10 Diagnosis Description L97.518 Non-pressure chronic ulcer of other part of right foot with other specified severity I83.013 Varicose veins of right lower  extremity with ulcer  of ankle I10 Essential (primary) hypertension Quantity: 1 Electronic Signature(s) Signed: 05/16/2021 3:38:30 PM By: [name] Shan DO Entered By: [name] on 05/16/2021 15:38:12

## 2021-05-21 DIAGNOSIS — G629 Polyneuropathy, unspecified: Secondary | ICD-10-CM | POA: Diagnosis not present

## 2021-05-24 NOTE — Progress Notes (Signed)
[facility] Note  05/27/2021     CHIEF COMPLAINT Patient presents for Retina Evaluation   HISTORY OF PRESENT ILLNESS: [name] is a 88 y.o. male who presents to the clinic today for:   HPI     Retina Evaluation   In both eyes.  This started years ago.  Duration of years.  Associated Symptoms Negative for Flashes, Floaters, Distortion, Blind Spot, Pain, Redness, Photophobia, Glare, Trauma, Scalp Tenderness, Jaw Claudication, Shoulder/Hip pain, Fever, Weight Loss and Fatigue.  Context:  distance vision.  Treatments tried include no treatments.  I, the attending physician,  performed the HPI with the patient and updated documentation appropriately.        Comments   88 y/o male pt referred by Dr. Herbert Deaner on 12.23.22 for eval of ARMD OU.  Pt reports Dr. Herbert Deaner worries his ARMD may be turning wet.  Pt reports his vision is good OU cc, but his Amsler grid has looked a bit wavy for about 6 mos.  Pt has not looked at it with either eye covered.  Denies pain, FOL, floaters.  No gtts.      Last edited by Bernarda Caffey, MD on 05/28/2021 11:28 AM.    Pt is here on the referral of Dr. Herbert Deaner for concern of exu ARMD, pt states he has been seeing her for years and at his last exam, she was concerned about a spot she saw in his left eye, pt is taking AREDS and monitoring his vision on an amsler grid  Referring physician: Monna Fam, MD [place],  [place] [address]  HISTORICAL INFORMATION:   Selected notes from the [facility] Referred by Dr. Herbert Deaner for concern of exu ARMD  LEE:  Ocular Hx- PMH-    CURRENT MEDICATIONS: No current outpatient medications on file. (Ophthalmic Drugs)   No current facility-administered medications for this visit. (Ophthalmic Drugs)   Current Outpatient Medications (Other)  Medication Sig   Ascorbic Acid (VITAMIN C PO) Take 1 tablet by mouth every Monday, Wednesday, and Friday. Takes along with  vit e   aspirin 325 MG tablet 1/2 tablet   aspirin 81 MG tablet Take 81 mg by mouth daily.   Azelastine HCl 137 MCG/SPRAY SOLN 1 puff in each nostril   Calcium Carb-Cholecalciferol (CALCIUM 600 + D PO) Take 1 tablet by mouth 2 (two) times daily. Takes at lunch 1200   gabapentin (NEURONTIN) 300 MG capsule 1 capsule   lisinopril (PRINIVIL,ZESTRIL) 20 MG tablet Take 20 mg by mouth daily.   lisinopril (ZESTRIL) 20 MG tablet Take 1 tablet by mouth daily.   lisinopril-hydrochlorothiazide (PRINZIDE,ZESTORETIC) 20-12.5 MG per tablet Take 1 tablet by mouth daily.   montelukast (SINGULAIR) 10 MG tablet 1 tablet   Multiple Vitamin (MULTIVITAMIN WITH MINERALS) TABS tablet Take 0.5 tablets by mouth 2 (two) times daily.   Multiple Vitamins-Minerals (EYE-VITE PLUS LUTEIN) CAPS Take 1 capsule by mouth daily.   naproxen sodium (ALEVE) 220 MG tablet Take 1 tablet (220 mg total) by mouth 2 (two) times daily with a meal.   Omega-3 Fatty Acids (FISH OIL) 1000 MG CAPS Take 1 capsule by mouth 2 (two) times daily.   oxyCODONE (OXY IR/ROXICODONE) 5 MG immediate release tablet Take 1 tablet (5 mg total) by mouth every 4 (four) hours as needed for severe pain.   predniSONE (DELTASONE) 20 MG tablet 2 tablets   Red Yeast Rice 600 MG CAPS 1 tablet   Red Yeast  Rice 600 MG TABS Take 600 mg by mouth every Tuesday, Thursday, and Saturday at 6 PM. Takes one time at lunch   traMADol (ULTRAM) 50 MG tablet 1 tablet as needed   vitamin E 180 MG (400 UNITS) capsule Take 400 Units by mouth every Monday, Wednesday, and Friday.   No current facility-administered medications for this visit. (Other)   REVIEW OF SYSTEMS: ROS   Positive for: Eyes Negative for: Constitutional, Gastrointestinal, Neurological, Skin, Genitourinary, Musculoskeletal, HENT, Endocrine, Cardiovascular, Respiratory, Psychiatric, Allergic/Imm, Heme/Lymph Last edited by Matthew Folks, COA on 05/27/2021  1:47 PM.     ALLERGIES No Known Allergies  PAST  MEDICAL HISTORY Past Medical History:  Diagnosis Date   Hypertension    Macular degeneration    Past Surgical History:  Procedure Laterality Date   CATARACT EXTRACTION     EYE SURGERY     ROTATOR CUFF REPAIR Bilateral    FAMILY HISTORY History reviewed. No pertinent family history.  SOCIAL HISTORY Social History   Tobacco Use   Smoking status: Never  Substance Use Topics   Alcohol use: No   Drug use: No       OPHTHALMIC EXAM:  Base Eye Exam     Visual Acuity (Snellen - Linear)       Right Left   Dist cc 20/20 - 20/20 -    Correction: Glasses         Tonometry (Tonopen, 1:53 PM)       Right Left   Pressure 18 20         Pupils       Dark Light Shape React APD   Right 3 2 Round Brisk None   Left 3 2 Round Brisk None         Visual Fields (Counting fingers)       Left Right    Full Full         Extraocular Movement       Right Left    Full, Ortho Full, Ortho         Neuro/Psych     Oriented x3: Yes   Mood/Affect: Normal         Dilation     Both eyes: 1.0% Mydriacyl @ 1:53 PM           Slit Lamp and Fundus Exam     Slit Lamp Exam       Right Left   Lids/Lashes Dermatochalasis - upper lid, mild MGD Dermatochalasis - upper lid, mild MGD   Conjunctiva/Sclera nasal pingeucula nasal pingeucula   Cornea arcus, 1+ inferior Punctate epithelial erosions arcus, 1-2+ inferior Punctate epithelial erosions   Anterior Chamber Deep and quiet Deep and quiet   Iris Round and moderately dilated to 4.65mm Round and moderately dilated to 4.60mm   Lens PC IOL in good position PC IOL in good position   Anterior Vitreous Vitreous syneresis Vitreous syneresis         Fundus Exam       Right Left   Disc mild Pallor, Sharp rim mild Pallor, Sharp rim, +PPA, Thin superior rim   C/D Ratio 0.5 0.5   Macula Flat, Blunted foveal reflex, Drusen, RPE mottling, clumping and early central atrophy, No heme or edema Flat, Blunted foveal reflex,  Drusen, RPE mottling and clumping, central PED, no heme or edema   Vessels attenuated, mild tortuousity attenuated, mild tortuousity   Periphery Attached, mild reticular degeneration, mild peripheral drusen, No heme Attached, mild reticular degeneration, peripheral  paving stone degeneration           Refraction     Wearing Rx       Sphere Cylinder Axis Add   Right -2.25 +3.25 180 +3.00   Left -1.25 +3.25 171 +3.00    Age: 72 yrs   Type: Bifocal         Manifest Refraction       Sphere Cylinder Axis Dist VA   Right -2.50 +3.00 005 20/20   Left -1.50 +3.50 173 20/20           IMAGING AND PROCEDURES  Imaging and Procedures for 05/27/2021  OCT, Retina - OU - Both Eyes       Right Eye Quality was good. Central Foveal Thickness: 285. Progression has no prior data. Findings include normal foveal contour, no IRF, no SRF, retinal drusen , outer retinal atrophy (Patchy ORA).   Left Eye Quality was good. Central Foveal Thickness: 321. Progression has no prior data. Findings include normal foveal contour, no IRF, no SRF, retinal drusen , pigment epithelial detachment, outer retinal atrophy (Central PEDs and patchy ORA).   Notes *Images captured and stored on drive  Diagnosis / Impression:  Non-exu ARMD OU OD: Patchy ORA; no IRF/SRF OS: Central PEDs and patchy ORA; no IRF/SRF  Clinical management:  See below  Abbreviations: NFP - Normal foveal profile. CME - cystoid macular edema. PED - pigment epithelial detachment. IRF - intraretinal fluid. SRF - subretinal fluid. EZ - ellipsoid zone. ERM - epiretinal membrane. ORA - outer retinal atrophy. ORT - outer retinal tubulation. SRHM - subretinal hyper-reflective material. IRHM - intraretinal hyper-reflective material            ASSESSMENT/PLAN:    ICD-10-CM   1. Intermediate stage nonexudative age-related macular degeneration of both eyes  H35.3132 OCT, Retina - OU - Both Eyes    2. Essential hypertension  I10      3. Hypertensive retinopathy of both eyes  H35.033     4. Pseudophakia, both eyes  Z96.1       1. Age related macular degeneration, non-exudative, both eyes  - intermediate stage w/ no IRF/SRF OU - OCT shows OD: Patchy ORA; OS: Central PEDs and patchy ORA  - The incidence, anatomy, and pathology of dry AMD, risk of progression, and the AREDS and AREDS 2 study including smoking risks discussed with patient.  - Recommend amsler grid monitoring  - f/u 3 months, DFE, OCT  2,3. Hypertensive retinopathy OU - discussed importance of tight BP control - monitor  4. Pseudophakia OU  - s/p CE/IOL OU  - IOLs in good position, doing well  - monitor  Ophthalmic Meds Ordered this visit:  No orders of the defined types were placed in this encounter.    Return in about 3 months (around 08/25/2021) for f/u non-exu ARMD OU, DFE, OCT.  There are no Patient Instructions on file for this visit.   Explained the diagnoses, plan, and follow up with the patient and they expressed understanding.  Patient expressed understanding of the importance of proper follow up care.   This document serves as a record of services personally performed by Karie Chimera, MD, PhD. It was created on their behalf by Annalee Genta, COMT. The creation of this record is the provider's dictation and/or activities during the visit.  Electronically signed by: Annalee Genta, COMT 05/28/21 11:30 AM  This document serves as a record of services personally performed by Karie Chimera, MD, PhD. It  was created on their behalf by San Jetty. Owens Shark, OA an ophthalmic technician. The creation of this record is the provider's dictation and/or activities during the visit.    Electronically signed by: San Jetty. Owens Shark, [place] 01.16.2023 11:30 AM   Gardiner Sleeper, M.D., Ph.D. Diseases & Surgery of the Retina and Vitreous [facility]  I have reviewed the above documentation for accuracy and completeness, and I agree  with the above. Gardiner Sleeper, M.D., Ph.D. 05/28/21 11:30 AM   Abbreviations: M myopia (nearsighted); A astigmatism; H hyperopia (farsighted); P presbyopia; Mrx spectacle prescription;  CTL contact lenses; OD right eye; OS left eye; OU both eyes  XT exotropia; ET esotropia; PEK punctate epithelial keratitis; PEE punctate epithelial erosions; DES dry eye syndrome; MGD meibomian gland dysfunction; ATs artificial tears; PFAT's preservative free artificial tears; [place] nuclear sclerotic cataract; PSC posterior subcapsular cataract; ERM epi-retinal membrane; PVD posterior vitreous detachment; RD retinal detachment; DM diabetes mellitus; DR diabetic retinopathy; NPDR non-proliferative diabetic retinopathy; PDR proliferative diabetic retinopathy; CSME clinically significant macular edema; DME diabetic macular edema; dbh dot blot hemorrhages; CWS cotton wool spot; POAG primary open angle glaucoma; C/D cup-to-disc ratio; HVF humphrey visual field; GVF goldmann visual field; OCT optical coherence tomography; IOP intraocular pressure; BRVO Branch retinal vein occlusion; CRVO central retinal vein occlusion; CRAO central retinal artery occlusion; BRAO branch retinal artery occlusion; RT retinal tear; SB scleral buckle; PPV pars plana vitrectomy; VH Vitreous hemorrhage; PRP panretinal laser photocoagulation; IVK intravitreal kenalog; VMT vitreomacular traction; MH Macular hole;  NVD neovascularization of the disc; NVE neovascularization elsewhere; AREDS age related eye disease study; ARMD age related macular degeneration; POAG primary open angle glaucoma; EBMD epithelial/anterior basement membrane dystrophy; ACIOL anterior chamber intraocular lens; IOL intraocular lens; PCIOL posterior chamber intraocular lens; Phaco/IOL phacoemulsification with intraocular lens placement; [place] photorefractive keratectomy; LASIK laser assisted in situ keratomileusis; HTN hypertension; DM diabetes mellitus; COPD chronic obstructive pulmonary  disease

## 2021-05-27 ENCOUNTER — Other Ambulatory Visit: Payer: Self-pay

## 2021-05-27 ENCOUNTER — Ambulatory Visit (INDEPENDENT_AMBULATORY_CARE_PROVIDER_SITE_OTHER): Payer: Medicare Other | Admitting: Ophthalmology

## 2021-05-27 ENCOUNTER — Encounter (INDEPENDENT_AMBULATORY_CARE_PROVIDER_SITE_OTHER): Payer: Self-pay | Admitting: Ophthalmology

## 2021-05-27 DIAGNOSIS — H353132 Nonexudative age-related macular degeneration, bilateral, intermediate dry stage: Secondary | ICD-10-CM

## 2021-05-27 DIAGNOSIS — Z961 Presence of intraocular lens: Secondary | ICD-10-CM | POA: Diagnosis not present

## 2021-05-27 DIAGNOSIS — I10 Essential (primary) hypertension: Secondary | ICD-10-CM | POA: Diagnosis not present

## 2021-05-27 DIAGNOSIS — H35033 Hypertensive retinopathy, bilateral: Secondary | ICD-10-CM | POA: Diagnosis not present

## 2021-05-28 ENCOUNTER — Encounter (INDEPENDENT_AMBULATORY_CARE_PROVIDER_SITE_OTHER): Payer: Self-pay | Admitting: Ophthalmology

## 2021-05-29 DIAGNOSIS — G629 Polyneuropathy, unspecified: Secondary | ICD-10-CM | POA: Diagnosis not present

## 2021-05-29 DIAGNOSIS — L97519 Non-pressure chronic ulcer of other part of right foot with unspecified severity: Secondary | ICD-10-CM | POA: Diagnosis not present

## 2021-05-29 DIAGNOSIS — I10 Essential (primary) hypertension: Secondary | ICD-10-CM | POA: Diagnosis not present

## 2021-05-29 DIAGNOSIS — E782 Mixed hyperlipidemia: Secondary | ICD-10-CM | POA: Diagnosis not present

## 2021-05-29 DIAGNOSIS — Z0000 Encounter for general adult medical examination without abnormal findings: Secondary | ICD-10-CM | POA: Diagnosis not present

## 2021-05-30 ENCOUNTER — Other Ambulatory Visit: Payer: Self-pay

## 2021-05-30 ENCOUNTER — Encounter (HOSPITAL_BASED_OUTPATIENT_CLINIC_OR_DEPARTMENT_OTHER): Payer: Medicare Other | Admitting: Internal Medicine

## 2021-05-30 DIAGNOSIS — I83013 Varicose veins of right lower extremity with ulcer of ankle: Secondary | ICD-10-CM

## 2021-05-30 DIAGNOSIS — Z87891 Personal history of nicotine dependence: Secondary | ICD-10-CM | POA: Diagnosis not present

## 2021-05-30 DIAGNOSIS — M1990 Unspecified osteoarthritis, unspecified site: Secondary | ICD-10-CM | POA: Diagnosis not present

## 2021-05-30 DIAGNOSIS — L97518 Non-pressure chronic ulcer of other part of right foot with other specified severity: Secondary | ICD-10-CM

## 2021-05-30 DIAGNOSIS — I10 Essential (primary) hypertension: Secondary | ICD-10-CM | POA: Diagnosis not present

## 2021-06-03 NOTE — Progress Notes (Signed)
POSEIDON, PAM ([id number]) Visit Report for 05/30/2021 Chief Complaint Document Details Patient Name: Date of Service: [name], [name] 05/30/2021 2:30 PM Medical Record Number: [id number] Patient Account Number: [id number] Date of Birth/Sex: Treating RN: 1932-06-27 (88 y.[name] Primary Care Provider: Shirlean [name] Other Clinician: Referring Provider: Treating Provider/Extender: [name] in Treatment: 17 Information Obtained from: Patient Chief Complaint Right foot wound Electronic Signature(s) Signed: 05/30/2021 4:41:07 PM By: [name] Corwin DO Entered By: [name] on 05/30/2021 16:36:48 -------------------------------------------------------------------------------- HPI Details Patient Name: Date of Service: [name]. 05/30/2021 2:30 PM Medical Record Number: [id number] Patient Account Number: [id number] Date of Birth/Sex: Treating RN: [birth year] (55 y.[name] Primary Care Provider: Shirlean [name] Other Clinician: Referring Provider: Treating Provider/Extender: [name] in Treatment: 17 History of Present Illness HPI Description: Admission 01/28/2021 [name] is an 88 year old male with a past medical history of essential hypertension that presents to the clinic for a 35-month history of nonhealing wound to his right ankle. He states that he thinks the wound started From his shoes rubbing the back of his ankle. He switched shoes however the wound never healed. He is currently been keeping the area covered and applying antibiotic ointment. He denies signs of infection. 9/27; patient presents for follow-up. He has been using Santyl on the wound bed daily. He has no issues or complaints today. He denies signs of infection. 10/6; patient presents for follow-up. He has been using Santyl on the wound bed daily. He continues to have chronic pain to this area. He denies signs of infection. 10/20;  patient presents for follow-up. He has been using Santyl to the wound beds daily. He has no issues or complaints today. He denies signs of infection. 10/27; patient presents for follow-up. He has been using Santyl and Hydrofera Blue to the wound beds. He denies signs of infection. 11/3; patient presents for follow-up. He has been using Iodoflex without issues. He denies signs of infection. 11/17; patient presents for follow-up. He continues to use Iodoflex without any issues. He currently denies signs of infection. He still reports chronic pain to the area. 12/1; patient presents for follow-up. He has been using Santyl with Hydrofera Blue. He currently denies signs of infection. He reports improvement to wound healing. 12/15; patient presents for follow-up. He continues to use Santyl with [employer]	[place]. He denies signs of infection. 1/5; patient presents for follow-up. He is using Santyl with Hydrofera Blue. He continues to have chronic pain to the wound site. 1/19; patient presents for follow-up. He started gentamicin ointment along with the Hydrofera Blue last week. He has decided to stop the [name] and presents today with new wound dressing. He continues to have chronic pain to the wound site. Electronic Signature(s) Signed: 05/30/2021 4:41:07 PM By: [name] Corwin DO Entered By: [name] on 05/30/2021 16:37:24 -------------------------------------------------------------------------------- Physical Exam Details Patient Name: Date of Service: [name] 05/30/2021 2:30 PM Medical Record Number: [id number] Patient Account Number: [id number] Date of Birth/Sex: Treating RN: [birth year] (88 y.[name] Primary Care Provider: Shirlean [name] Other Clinician: Referring Provider: Treating Provider/Extender: [name] in Treatment: 17 Constitutional respirations regular, non-labored and within target range for patient.. Cardiovascular 2+  dorsalis pedis/posterior tibialis pulses. Psychiatric pleasant and cooperative. Notes Right posterior ankle: Open wound with granulation tissue, slough and fibrinous tissue. No evidence of active infection. Electronic Signature(s) Signed: 05/30/2021 4:41:07 PM By: [name] Corwin DO Entered By:  [name] CorwinHoffman, Esbeidy Mclaine on 05/30/2021 16:37:46 -------------------------------------------------------------------------------- Physician Orders Details Patient Name: Date of Service: [name], [name] LIN J. 05/30/2021 2:30 PM Medical Record Number: [id number] Patient Account Number: [id number] Date of Birth/Sex: Treating RN: 03-Nov-1932 (88 y.[name]. 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 - Dr. Mikey [name] Return A Other: - [name] to obtain recent lab results, once obtained we will order MRI. Bathing/ Shower/ Hygiene May shower and wash wound with soap and water. Off-Loading Other: - keep pressure off of right lateral achilles Wound Treatment Wound #1 - Achilles Wound Laterality: Right, Lateral Cleanser: Soap and Water Every Other Day/15 Days Discharge Instructions: May shower and wash wound with dial antibacterial soap and water prior to dressing change. Peri-Wound Care: Skin Prep (Generic) Every Other Day/15 Days Discharge Instructions: Use skin prep as directed Topical: Gentamicin Every Other Day/15 Days Discharge Instructions: As directed by physician Prim Dressing: Santyl Ointment Every Other Day/15 Days ary Discharge Instructions:  Apply nickel thick amount to wound bed as instructed Secondary Dressing: Zetuvit Plus Silicone Border Dressing 4x4 (in/in) Every Other Day/15 Days Discharge Instructions: Apply silicone border over primary dressing as directed. Electronic Signature(s) Signed: 05/30/2021 4:41:07 PM By: [name] CorwinHoffman, Semaja Lymon DO Entered By: [name] CorwinHoffman, Maeola Mchaney on 05/30/2021 16:38:01 -------------------------------------------------------------------------------- Problem List Details Patient Name: Date of Service: [name], [name] LIN J. 05/30/2021 2:30 PM Medical Record Number: [id number] Patient Account Number: [id number] Date of Birth/Sex: Treating RN: 03-Nov-1932 (88 y.[name]. 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 03/28/2021 No Yes I10 Essential (primary) hypertension 01/28/2021 No Yes Inactive Problems ICD-10 Code Description Active Date Inactive Date S91.301A Unspecified open wound, right foot, initial encounter 01/28/2021 01/29/2021 Resolved Problems Electronic Signature(s) Signed: 05/30/2021 4:41:07 PM By: [name] CorwinHoffman, Jermaine Neuharth DO Previous Signature: 05/30/2021 3:08:47 PM Version By: Antonieta IbaBarnhart, [name] Entered By: [name] CorwinHoffman, Dacia Capers on 05/30/2021 16:36:33 -------------------------------------------------------------------------------- Progress Note Details Patient Name: Date of Service: [name], [name] LIN J. 05/30/2021 2:30 PM Medical Record Number: [id number] Patient Account Number: [id number] Date of Birth/Sex: Treating RN: 03-Nov-1932 (88 y.[name]. Elizebeth KollerM) Lynch, Shatara Primary Care Provider: Shirlean MylarWebb, [name] Other Clinician: Referring Provider: Treating  Provider/Extender: [name] HeinzHoffman, Kentrail Shew [name], [name] Weeks in Treatment: 17 Subjective Chief Complaint Information obtained from Patient Right foot wound History of Present Illness (HPI) Admission 01/28/2021 Mr. Zadie CleverlyOlin Dunnigan is an 88 year old male with a past medical history of essential hypertension that presents to the clinic for a 7040-month history of nonhealing wound to his right ankle. He states that he thinks the wound started From his shoes rubbing the back of his ankle. He switched shoes however the wound never healed. He is currently been keeping the area covered and applying antibiotic ointment. He denies signs of infection. 9/27; patient presents for follow-up. He has been using Santyl on the wound bed daily. He has no issues or complaints today. He denies signs of infection. 10/6; patient presents for  follow-up. He has been using Santyl on the wound bed daily. He continues to have chronic pain to this area. He denies signs of infection. 10/20; patient presents for follow-up. He has been using Santyl to the wound beds daily. He has no issues or complaints today. He denies signs of infection. 10/27; patient presents for follow-up. He has been using Santyl and Hydrofera Blue to the wound beds. He denies signs of infection. 11/3; patient presents for follow-up. He has been using Iodoflex without issues. He denies signs of infection. 11/17; patient presents for follow-up. He continues to use Iodoflex without any issues. He currently denies signs of infection. He still reports chronic pain to the area. 12/1; patient presents for follow-up. He has been using Santyl with Hydrofera Blue. He currently denies signs of infection. He reports improvement to wound healing. 12/15; patient presents for follow-up. He continues to use Santyl with [employer]	[place]. He denies signs of infection. 1/5; patient presents for follow-up. He is using Santyl with Hydrofera Blue. He continues to have chronic pain to the  wound site. 1/19; patient presents for follow-up. He started gentamicin ointment along with the Hydrofera Blue last week. He has decided to stop the [facility] and presents today with new wound dressing. He continues to have chronic pain to the wound site. Patient History Information obtained from Patient. Family History Cancer - Siblings, Diabetes - Siblings, Kidney Disease - Siblings, No family history of Heart Disease, Hereditary Spherocytosis, Hypertension, Lung Disease, Seizures, Stroke, Thyroid Problems, Tuberculosis. Social History Former smoker - quit age 49, Marital Status - Married, Alcohol Use - Never, Drug Use - No History, Caffeine Use - Daily - coffee. Medical History Eyes Patient has history of Cataracts - bil removed Denies history of Glaucoma, Optic Neuritis Ear/Nose/Mouth/Throat Denies history of Chronic sinus problems/congestion, Middle ear problems Cardiovascular Patient has history of Hypertension Musculoskeletal Patient has history of Osteoarthritis Oncologic Denies history of Received Chemotherapy, Received Radiation Psychiatric Denies history of Anorexia/bulimia, Confinement Anxiety Hospitalization/Surgery History - bil rotator cuff surgeries. Medical A Surgical History Notes nd Ear/Nose/Mouth/Throat extremely hard of hearing Neurologic hx cervical fractures Objective Constitutional respirations regular, non-labored and within target range for patient.. Vitals Time Taken: 2:55 PM, Height: 60 in, Weight: 122 lbs, BMI: 23.8, Temperature: 98.8 F, Pulse: 84 bpm, Respiratory Rate: 16 breaths/min, Blood Pressure: 148/99 mmHg. Cardiovascular 2+ dorsalis pedis/posterior tibialis pulses. Psychiatric pleasant and cooperative. General Notes: Right posterior ankle: Open wound with granulation tissue, slough and fibrinous tissue. No evidence of active infection. Integumentary (Hair, Skin) Wound #1 status is Open. Original cause of wound was Gradually  Appeared. The date acquired was: 05/16/2020. The wound has been in treatment 12 weeks. The wound is located on the Right,Lateral Achilles. The wound measures 1.9cm length x 0.7cm width x 0.3cm depth; 1.045cm^2 area and 0.313cm^3 volume. There is Fat Layer (Subcutaneous Tissue) exposed. There is no tunneling or undermining noted. There is a medium amount of serous drainage noted. The wound margin is distinct with the outline attached to the wound base. There is large (67-100%) pink granulation within the wound bed. There is a small (1-33%) amount of necrotic tissue within the wound bed including Adherent Slough. 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 No signs of surrounding infection. He no longer wants to use a dressing to the wound bed. He prefers using ointments. I  recommended using either gentamicin or Santyl to the area. T oday he declines a dressing change in the office. He would like to leave this open to air. I advised against this as this is high risk for infection. At this time I recommended getting an MRI since he continues to have chronic pain and he has had this wound for almost a year. We will obtain his BMP from his primary office to see if this needs to be ordered without contrast. He wears dress shoes that come up to the area of where the wound is located. I am concerned that this caused the wound and is keeping it from healing. He states he does not wear the shoes on a regular basis. I recommended keeping the area protected And offloaded Plan Follow-up Appointments: Return Appointment in 2 weeks. - Dr. Mikey [name] Other: - [name] results, once obtained we will order MRI. Bathing/ Shower/ Hygiene: May shower and wash wound with soap and  water. Off-Loading: Other: - keep pressure off of right lateral achilles WOUND #1: - Achilles Wound Laterality: Right, Lateral Cleanser: Soap and Water Every Other Day/15 Days Discharge Instructions: May shower and wash wound with dial antibacterial soap and water prior to dressing change. Peri-Wound Care: Skin Prep (Generic) Every Other Day/15 Days Discharge Instructions: Use skin prep as directed Topical: Gentamicin Every Other Day/15 Days Discharge Instructions: As directed by physician Prim Dressing: Santyl Ointment Every Other Day/15 Days ary Discharge Instructions: Apply nickel thick amount to wound bed as instructed Secondary Dressing: Zetuvit Plus Silicone Border Dressing 4x4 (in/in) Every Other Day/15 Days Discharge Instructions: Apply silicone border over primary dressing as directed. 1. Santyl or gentamicin ointment daily 2. MRI 3. Follow-up in 2 weeks Electronic Signature(s) Signed: 05/30/2021 4:41:07 PM By: [name] CorwinHoffman, Daniell Paradise DO Entered By: [name] CorwinHoffman, Joaovictor Krone on 05/30/2021 16:40:30 -------------------------------------------------------------------------------- HxROS Details Patient Name: Date of Service: [name], [name] LIN J. 05/30/2021 2:30 PM Medical Record Number: [id number] Patient Account Number: [id number] Date of Birth/Sex: Treating RN: 1932-05-23 (88 y.[name]. Elizebeth KollerM) Lynch, Shatara Primary Care Provider: Shirlean MylarWebb, [name] Other Clinician: Referring Provider: Treating Provider/Extender: [name] HeinzHoffman, Desia Saban [name], [name] Weeks in Treatment: 17 Information Obtained From Patient Eyes Medical History: Positive for: Cataracts - bil removed Negative for: Glaucoma; Optic Neuritis Ear/Nose/Mouth/Throat Medical History: Negative for: Chronic sinus problems/congestion; Middle ear problems Past Medical History Notes: extremely hard of hearing Cardiovascular Medical History: Positive for: Hypertension Musculoskeletal Medical History: Positive for: Osteoarthritis Neurologic Medical  History: Past Medical History Notes: hx cervical fractures Oncologic Medical History: Negative for: Received Chemotherapy; Received Radiation Psychiatric Medical History: Negative for: Anorexia/bulimia; Confinement Anxiety HBO Extended History Items Eyes: Cataracts Immunizations Pneumococcal Vaccine: Received Pneumococcal Vaccination: Yes Received Pneumococcal Vaccination On or After 60th Birthday: Yes Implantable Devices None Hospitalization / Surgery History Type of Hospitalization/Surgery bil rotator cuff surgeries Family and Social History Cancer: Yes - Siblings; Diabetes: Yes - Siblings; Heart Disease: No; Hereditary Spherocytosis: No; Hypertension: No; Kidney Disease: Yes - Siblings; Lung Disease: No; Seizures: No; Stroke: No; Thyroid Problems: No; Tuberculosis: No; Former smoker - quit age 88; Marital Status - Married; Alcohol Use: Never; Drug Use: No History; Caffeine Use: Daily - coffee; Financial Concerns: No; Food, Clothing or Shelter Needs: No; Support System Lacking: No; Transportation Concerns: No Electronic Signature(s) Signed: 05/30/2021 4:41:07 PM By: [name] CorwinHoffman, Niam Nepomuceno DO Signed: 06/03/2021 5:11:23 PM By: Zandra AbtsLynch, Shatara RN, BSN Entered By: [name] CorwinHoffman, Jazziel Fitzsimmons on 05/30/2021 16:37:30 -------------------------------------------------------------------------------- SuperBill Details Patient Name: Date of Service: [name], [name] LIN J. 05/30/2021  Medical Record Number: [id number] Patient Account Number: [id number] Date of Birth/Sex: Treating RN: January 27, 1933 (53 y.[name]. 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 I10 Essential (primary)  hypertension Facility Procedures CPT4 Code: [id number] Description: [phone] - WOUND CARE VISIT-LEV 2 EST PT Modifier: Quantity: 1 Physician Procedures : CPT4 Code Description Modifier [id number] - WC PHYS LEVEL 3 - EST PT ICD-10 Diagnosis Description L97.518 Non-pressure chronic ulcer of other part of right foot with other specified severity I83.013 Varicose veins of right lower extremity with ulcer  of ankle I10 Essential (primary) hypertension Quantity: 1 Electronic Signature(s) Signed: 05/30/2021 4:41:07 PM By: [name] Corwin DO Entered By: [name] on 05/30/2021 16:40:49

## 2021-06-11 DIAGNOSIS — G629 Polyneuropathy, unspecified: Secondary | ICD-10-CM | POA: Diagnosis not present

## 2021-06-11 DIAGNOSIS — Z6825 Body mass index (BMI) 25.0-25.9, adult: Secondary | ICD-10-CM | POA: Diagnosis not present

## 2021-06-11 DIAGNOSIS — M79671 Pain in right foot: Secondary | ICD-10-CM | POA: Diagnosis not present

## 2021-06-11 DIAGNOSIS — M79672 Pain in left foot: Secondary | ICD-10-CM | POA: Diagnosis not present

## 2021-06-13 ENCOUNTER — Encounter (HOSPITAL_BASED_OUTPATIENT_CLINIC_OR_DEPARTMENT_OTHER): Payer: Medicare Other | Admitting: Internal Medicine

## 2021-07-02 DIAGNOSIS — E876 Hypokalemia: Secondary | ICD-10-CM | POA: Diagnosis not present

## 2021-07-03 ENCOUNTER — Other Ambulatory Visit: Payer: Self-pay | Admitting: Family Medicine

## 2021-07-03 DIAGNOSIS — L97519 Non-pressure chronic ulcer of other part of right foot with unspecified severity: Secondary | ICD-10-CM

## 2021-07-04 ENCOUNTER — Other Ambulatory Visit: Payer: Self-pay

## 2021-07-04 ENCOUNTER — Ambulatory Visit
Admission: RE | Admit: 2021-07-04 | Discharge: 2021-07-04 | Disposition: A | Discharge status: Home / Self Care | Payer: Medicare Other | Source: Ambulatory Visit | Attending: Family Medicine | Admitting: Family Medicine

## 2021-07-04 DIAGNOSIS — L97519 Non-pressure chronic ulcer of other part of right foot with unspecified severity: Secondary | ICD-10-CM

## 2021-07-04 DIAGNOSIS — L97413 Non-pressure chronic ulcer of right heel and midfoot with necrosis of muscle: Secondary | ICD-10-CM | POA: Diagnosis not present

## 2021-07-04 MED ORDER — GADOBENATE DIMEGLUMINE 529 MG/ML IV SOLN
12.0000 mL | Freq: Once | INTRAVENOUS | Status: AC | PRN
  Administered 2021-07-04: 12 mL via INTRAVENOUS

## 2021-08-15 DIAGNOSIS — L89513 Pressure ulcer of right ankle, stage 3: Secondary | ICD-10-CM | POA: Diagnosis not present

## 2021-08-23 NOTE — Progress Notes (Signed)
?[facility] - Clinic Note ? ?08/26/2021 ? ?  ? ?CHIEF COMPLAINT ?Patient presents for Retina Follow Up ? ? ?HISTORY OF PRESENT ILLNESS: ?[name] is a 88 y.o. male who presents to the clinic today for:  ? ?HPI   ? ? Retina Follow Up   ?Patient presents with  Dry AMD.  In both eyes.  Duration of 3 months.  Since onset it is stable.  I, the attending physician,  performed the HPI with the patient and updated documentation appropriately. ? ?  ?  ? ? Comments   ?3 month follow up ARMD OU-  Vision appears stable OU.  If he gets up in the middle of the night he will see a black, round area in vision, with lights off.  ? ?  ?  ?Last edited by Rennis ChrisZamora, Arnetia Bronk, MD on 08/27/2021 11:48 PM.  ?  ? ? ?Referring physician: ?Shirlean MylarWebb, Carol, MD ?[address] ?[address] ?[place] [id number] ? ?HISTORICAL INFORMATION:  ? ?Selected notes from the MEDICAL RECORD NUMBER ?Referred by Dr. Elmer PickerHecker for concern of exu ARMD  ?LEE:  ?Ocular Hx- ?PMH- ?  ? ?CURRENT MEDICATIONS: ?No current outpatient medications on file. (Ophthalmic Drugs)  ? ?No current facility-administered medications for this visit. (Ophthalmic Drugs)  ? ?Current Outpatient Medications (Other)  ?Medication Sig  ? Ascorbic Acid (VITAMIN C PO) Take 1 tablet by mouth every Monday, Wednesday, and Friday. Takes along with vit e  ? aspirin 325 MG tablet 1/2 tablet  ? Azelastine HCl 137 MCG/SPRAY SOLN 1 puff in each nostril  ? Calcium Carb-Cholecalciferol (CALCIUM 600 + D PO) Take 1 tablet by mouth 2 (two) times daily. Takes at lunch 1200  ? gabapentin (NEURONTIN) 300 MG capsule 1 capsule  ? lisinopril (PRINIVIL,ZESTRIL) 20 MG tablet Take 20 mg by mouth daily.  ? lisinopril-hydrochlorothiazide (PRINZIDE,ZESTORETIC) 20-12.5 MG per tablet Take 1 tablet by mouth daily.  ? montelukast (SINGULAIR) 10 MG tablet 1 tablet  ? Multiple Vitamin (MULTIVITAMIN WITH MINERALS) TABS tablet Take 0.5 tablets by mouth 2 (two) times daily.  ? Multiple Vitamins-Minerals  (EYE-VITE PLUS LUTEIN) CAPS Take 1 capsule by mouth daily.  ? naproxen sodium (ALEVE) 220 MG tablet Take 1 tablet (220 mg total) by mouth 2 (two) times daily with a meal.  ? Omega-3 Fatty Acids (FISH OIL) 1000 MG CAPS Take 1 capsule by mouth 2 (two) times daily.  ? oxyCODONE (OXY IR/ROXICODONE) 5 MG immediate release tablet Take 1 tablet (5 mg total) by mouth every 4 (four) hours as needed for severe pain.  ? Red Yeast Rice 600 MG TABS Take 600 mg by mouth every Tuesday, Thursday, and Saturday at 6 PM. Takes one time at lunch  ? traMADol (ULTRAM) 50 MG tablet 1 tablet as needed  ? vitamin E 180 MG (400 UNITS) capsule Take 400 Units by mouth every Monday, Wednesday, and Friday.  ? aspirin 81 MG tablet Take 81 mg by mouth daily. (Patient not taking: Reported on 08/26/2021)  ? lisinopril (ZESTRIL) 20 MG tablet Take 1 tablet by mouth daily. (Patient not taking: Reported on 08/26/2021)  ? predniSONE (DELTASONE) 20 MG tablet 2 tablets (Patient not taking: Reported on 08/26/2021)  ? Red Yeast Rice 600 MG CAPS 1 tablet  ? ?No current facility-administered medications for this visit. (Other)  ? ?REVIEW OF SYSTEMS: ?ROS   ?Positive for: Eyes ?Negative for: Constitutional, Gastrointestinal, Neurological, Skin, Genitourinary, Musculoskeletal, HENT, Endocrine, Cardiovascular, Respiratory, Psychiatric, Allergic/Imm, Heme/Lymph ?Last edited  by Joni Reining, COA on 08/26/2021  1:20 PM.  ?  ? ? ?ALLERGIES ?Allergies  ?Allergen Reactions  ? Prednisone   ? ?PAST MEDICAL HISTORY ?Past Medical History:  ?Diagnosis Date  ? Hypertension   ? Macular degeneration   ? ?Past Surgical History:  ?Procedure Laterality Date  ? CATARACT EXTRACTION    ? EYE SURGERY    ? ROTATOR CUFF REPAIR Bilateral   ? ?FAMILY HISTORY ?History reviewed. No pertinent family history. ? ?SOCIAL HISTORY ?Social History  ? ?Tobacco Use  ? Smoking status: Never  ?Substance Use Topics  ? Alcohol use: No  ? Drug use: No  ?  ? ?  ?OPHTHALMIC EXAM: ? ?Base Eye Exam   ? ?  Visual Acuity (Snellen - Linear)   ? ?   Right Left  ? Dist cc 20/20- 20/30 +2  ? Dist ph cc  20/20  ? ?  ?  ? ? Tonometry (Tonopen, 1:33 PM)   ? ?   Right Left  ? Pressure 19 21  ? ?  ?  ? ? Pupils   ? ?   Dark Light Shape React APD  ? Right 3 2 Round Brisk None  ? Left 3 2 Round Brisk None  ? ?  ?  ? ? Visual Fields (Counting fingers)   ? ?   Left Right  ?  Full Full  ? ?  ?  ? ? Extraocular Movement   ? ?   Right Left  ?  Full Full  ? ?  ?  ? ? Neuro/Psych   ? ? Oriented x3: Yes  ? Mood/Affect: Normal  ? ?  ?  ? ? Dilation   ? ? Both eyes: 1.0% Mydriacyl, 2.5% Phenylephrine @ 1:33 PM  ? ?  ?  ? ?  ? ?Slit Lamp and Fundus Exam   ? ? Slit Lamp Exam   ? ?   Right Left  ? Lids/Lashes Dermatochalasis - upper lid, mild MGD Dermatochalasis - upper lid, mild MGD  ? Conjunctiva/Sclera nasal pingeucula nasal pingeucula  ? Cornea arcus, trace Punctate epithelial erosions arcus, trace Punctate epithelial erosions  ? Anterior Chamber Deep and quiet Deep and quiet  ? Iris Round and moderately dilated to 4.40mm Round and moderately dilated to 4.24mm  ? Lens PC IOL in good position PC IOL in good position  ? Anterior Vitreous Vitreous syneresis Vitreous syneresis  ? ?  ?  ? ? Fundus Exam   ? ?   Right Left  ? Disc mild Pallor, Sharp rim mild Pallor, Sharp rim, +PPA, Thin superior rim  ? C/D Ratio 0.5 0.5  ? Macula Flat, Blunted foveal reflex, Drusen, RPE mottling, clumping and early central atrophy, No heme or edema Flat, Blunted foveal reflex, Drusen, RPE mottling and clumping, central PED, no heme or edema  ? Vessels attenuated, mild tortuosity attenuated, mild tortuosity  ? Periphery Attached, mild reticular degeneration, mild peripheral drusen, No heme Attached, mild reticular degeneration, peripheral paving stone degeneration  ? ?  ?  ? ?  ? ?Refraction   ? ? Wearing Rx   ? ?   Sphere Cylinder Axis Add  ? Right -2.25 +3.25 180 +3.00  ? Left -1.25 +3.25 171 +3.00  ? ? Type: Bifocal  ? ?  ?  ? ?  ? ?IMAGING AND PROCEDURES   ?Imaging and Procedures for 08/26/2021 ? ?OCT, Retina - OU - Both Eyes   ? ?   ?Right Eye ?Quality  was good. Central Foveal Thickness: 266. Progression has been stable. Findings include normal foveal contour, no IRF, no SRF, retinal drusen , outer retinal atrophy (Patchy ORA).  ? ?Left Eye ?Quality was good. Central Foveal Thickness: 278. Progression has been stable. Findings include normal foveal contour, no IRF, no SRF, retinal drusen , pigment epithelial detachment, outer retinal atrophy (Central PEDs and patchy ORA).  ? ?Notes ?*Images captured and stored on drive ? ?Diagnosis / Impression:  ?Non-exu ARMD OU ?OD: Patchy ORA; no IRF/SRF ?OS: Central PEDs and patchy ORA; no IRF/SRF ? ?Clinical management:  ?See below ? ?Abbreviations: NFP - Normal foveal profile. CME - cystoid macular edema. PED - pigment epithelial detachment. IRF - intraretinal fluid. SRF - subretinal fluid. EZ - ellipsoid zone. ERM - epiretinal membrane. ORA - outer retinal atrophy. ORT - outer retinal tubulation. SRHM - subretinal hyper-reflective material. IRHM - intraretinal hyper-reflective material ? ? ?  ?  ?  ? ?  ?ASSESSMENT/PLAN: ? ?  ICD-10-CM   ?1. Intermediate stage nonexudative age-related macular degeneration of both eyes  H35.3132 OCT, Retina - OU - Both Eyes  ?  ?2. Essential hypertension  I10   ?  ?3. Hypertensive retinopathy of both eyes  H35.033   ?  ?4. Pseudophakia, both eyes  Z96.1   ?  ? ?1. Age related macular degeneration, non-exudative, both eyes ? - intermediate stage w/ no IRF/SRF OU ?- OCT shows OD: Patchy ORA; OS: Central PEDs and patchy ORA ? - The incidence, anatomy, and pathology of dry AMD, risk of progression, and the AREDS and AREDS 2 study including smoking risks discussed with patient. ? - Recommend amsler grid monitoring ? - f/u 6 months, DFE, OCT ? ?2,3. Hypertensive retinopathy OU ?- discussed importance of tight BP control ?- monitor  ? ?4. Pseudophakia OU ? - s/p CE/IOL OU ? - IOLs in good position,  doing well ? - monitor  ? ?Ophthalmic Meds Ordered this visit:  ?No orders of the defined types were placed in this encounter. ?  ? ?Return in about 6 months (around 02/25/2022) for f/u non-exu ARMD OU, DFE, OCT.

## 2021-08-26 ENCOUNTER — Encounter (INDEPENDENT_AMBULATORY_CARE_PROVIDER_SITE_OTHER): Payer: Self-pay | Admitting: Ophthalmology

## 2021-08-26 ENCOUNTER — Ambulatory Visit (INDEPENDENT_AMBULATORY_CARE_PROVIDER_SITE_OTHER): Payer: Medicare Other | Admitting: Ophthalmology

## 2021-08-26 DIAGNOSIS — Z961 Presence of intraocular lens: Secondary | ICD-10-CM | POA: Diagnosis not present

## 2021-08-26 DIAGNOSIS — H353132 Nonexudative age-related macular degeneration, bilateral, intermediate dry stage: Secondary | ICD-10-CM

## 2021-08-26 DIAGNOSIS — H35033 Hypertensive retinopathy, bilateral: Secondary | ICD-10-CM | POA: Diagnosis not present

## 2021-08-26 DIAGNOSIS — I10 Essential (primary) hypertension: Secondary | ICD-10-CM

## 2021-08-26 DIAGNOSIS — L97519 Non-pressure chronic ulcer of other part of right foot with unspecified severity: Secondary | ICD-10-CM | POA: Diagnosis not present

## 2021-08-26 DIAGNOSIS — R7309 Other abnormal glucose: Secondary | ICD-10-CM | POA: Diagnosis not present

## 2021-08-26 DIAGNOSIS — E782 Mixed hyperlipidemia: Secondary | ICD-10-CM | POA: Diagnosis not present

## 2021-08-27 ENCOUNTER — Encounter (INDEPENDENT_AMBULATORY_CARE_PROVIDER_SITE_OTHER): Payer: Self-pay | Admitting: Ophthalmology

## 2021-11-27 DIAGNOSIS — L97519 Non-pressure chronic ulcer of other part of right foot with unspecified severity: Secondary | ICD-10-CM | POA: Diagnosis not present

## 2021-12-10 DIAGNOSIS — L821 Other seborrheic keratosis: Secondary | ICD-10-CM | POA: Diagnosis not present

## 2021-12-10 DIAGNOSIS — L570 Actinic keratosis: Secondary | ICD-10-CM | POA: Diagnosis not present

## 2021-12-10 DIAGNOSIS — L814 Other melanin hyperpigmentation: Secondary | ICD-10-CM | POA: Diagnosis not present

## 2021-12-10 DIAGNOSIS — L280 Lichen simplex chronicus: Secondary | ICD-10-CM | POA: Diagnosis not present

## 2021-12-10 DIAGNOSIS — D485 Neoplasm of uncertain behavior of skin: Secondary | ICD-10-CM | POA: Diagnosis not present

## 2021-12-10 DIAGNOSIS — C44519 Basal cell carcinoma of skin of other part of trunk: Secondary | ICD-10-CM | POA: Diagnosis not present

## 2021-12-10 DIAGNOSIS — C44212 Basal cell carcinoma of skin of right ear and external auricular canal: Secondary | ICD-10-CM | POA: Diagnosis not present

## 2022-02-20 NOTE — Progress Notes (Addendum)
[facility] - Clinic Note  02/24/2022     CHIEF COMPLAINT Patient presents for Retina Follow Up   HISTORY OF PRESENT ILLNESS: [name] is a 88 y.o. male who presents to the clinic today for:   HPI     Retina Follow Up   Patient presents with  Dry AMD.  In both eyes.  This started 6 months ago.  I, the attending physician,  performed the HPI with the patient and updated documentation appropriately.        Comments   Patient here for 6 months retina follow up for non exu ARMD OU. Patient states vision doing pretty good. No eye pain.       Last edited by Rennis Chris, MD on 02/24/2022  9:50 PM.    Pt states no changes in vision or health  Referring physician: Mateo Flow, MD [address] [place],  [place] [address]  HISTORICAL INFORMATION:   Selected notes from the MEDICAL RECORD NUMBER Referred by Dr. Elmer Picker for concern of exu ARMD  LEE:  Ocular Hx- PMH-    CURRENT MEDICATIONS: No current outpatient medications on file. (Ophthalmic Drugs)   No current facility-administered medications for this visit. (Ophthalmic Drugs)   Current Outpatient Medications (Other)  Medication Sig   Ascorbic Acid (VITAMIN C PO) Take 1 tablet by mouth every Monday, Wednesday, and Friday. Takes along with vit e   aspirin 325 MG tablet 1/2 tablet   Azelastine HCl 137 MCG/SPRAY SOLN 1 puff in each nostril   Calcium Carb-Cholecalciferol (CALCIUM 600 + D PO) Take 1 tablet by mouth 2 (two) times daily. Takes at lunch 1200   gabapentin (NEURONTIN) 300 MG capsule 1 capsule   lisinopril (PRINIVIL,ZESTRIL) 20 MG tablet Take 20 mg by mouth daily.   lisinopril-hydrochlorothiazide (PRINZIDE,ZESTORETIC) 20-12.5 MG per tablet Take 1 tablet by mouth daily.   montelukast (SINGULAIR) 10 MG tablet 1 tablet   Multiple Vitamin (MULTIVITAMIN WITH MINERALS) TABS tablet Take 0.5 tablets by mouth 2 (two) times daily.   Multiple Vitamins-Minerals (EYE-VITE PLUS LUTEIN) CAPS  Take 1 capsule by mouth daily.   naproxen sodium (ALEVE) 220 MG tablet Take 1 tablet (220 mg total) by mouth 2 (two) times daily with a meal.   Omega-3 Fatty Acids (FISH OIL) 1000 MG CAPS Take 1 capsule by mouth 2 (two) times daily.   oxyCODONE (OXY IR/ROXICODONE) 5 MG immediate release tablet Take 1 tablet (5 mg total) by mouth every 4 (four) hours as needed for severe pain.   Red Yeast Rice 600 MG CAPS 1 tablet   Red Yeast Rice 600 MG TABS Take 600 mg by mouth every Tuesday, Thursday, and Saturday at 6 PM. Takes one time at lunch   traMADol (ULTRAM) 50 MG tablet 1 tablet as needed   vitamin E 180 MG (400 UNITS) capsule Take 400 Units by mouth every Monday, Wednesday, and Friday.   aspirin 81 MG tablet Take 81 mg by mouth daily. (Patient not taking: Reported on 08/26/2021)   lisinopril (ZESTRIL) 20 MG tablet Take 1 tablet by mouth daily. (Patient not taking: Reported on 08/26/2021)   predniSONE (DELTASONE) 20 MG tablet 2 tablets (Patient not taking: Reported on 08/26/2021)   No current facility-administered medications for this visit. (Other)   REVIEW OF SYSTEMS: ROS   Positive for: Eyes Negative for: Constitutional, Gastrointestinal, Neurological, Skin, Genitourinary, Musculoskeletal, HENT, Endocrine, Cardiovascular, Respiratory, Psychiatric, Allergic/Imm, Heme/Lymph Last edited by Laddie Aquas, COA on 02/24/2022  1:33 PM.  ALLERGIES Allergies  Allergen Reactions   Prednisone    PAST MEDICAL HISTORY Past Medical History:  Diagnosis Date   Hypertension    Macular degeneration    Past Surgical History:  Procedure Laterality Date   CATARACT EXTRACTION     EYE SURGERY     ROTATOR CUFF REPAIR Bilateral    FAMILY HISTORY History reviewed. No pertinent family history.  SOCIAL HISTORY Social History   Tobacco Use   Smoking status: Never  Substance Use Topics   Alcohol use: No   Drug use: No       OPHTHALMIC EXAM:  Base Eye Exam     Visual Acuity (Snellen -  Linear)       Right Left   Dist cc 20/25 +1 20/25 -1   Dist ph cc NI 20/20    Correction: Glasses         Tonometry (Tonopen, 1:31 PM)       Right Left   Pressure 14 15         Pupils       Dark Light Shape React APD   Right 3 2 Round Brisk None   Left 3 2 Round Brisk None         Visual Fields (Counting fingers)       Left Right    Full Full         Extraocular Movement       Right Left    Full, Ortho Full, Ortho         Neuro/Psych     Oriented x3: Yes   Mood/Affect: Normal         Dilation     Both eyes: 1.0% Mydriacyl, 2.5% Phenylephrine @ 1:31 PM           Slit Lamp and Fundus Exam     Slit Lamp Exam       Right Left   Lids/Lashes Dermatochalasis - upper lid, mild MGD Dermatochalasis - upper lid, mild MGD   Conjunctiva/Sclera nasal pingeucula nasal pingeucula   Cornea arcus, trace Punctate epithelial erosions arcus, trace Punctate epithelial erosions   Anterior Chamber Deep and quiet Deep and quiet   Iris Round and moderately dilated to 4.75mm Round and moderately dilated to 4.62mm   Lens PC IOL in good position PC IOL in good position   Anterior Vitreous Vitreous syneresis Vitreous syneresis         Fundus Exam       Right Left   Disc mild Pallor, Sharp rim mild Pallor, Sharp rim, +PPA, Thin superior rim   C/D Ratio 0.5 0.6   Macula Flat, Blunted foveal reflex, Drusen, RPE mottling, clumping and early central atrophy, No heme or edema Flat, Blunted foveal reflex, Drusen, RPE mottling and clumping, central PED, no heme or edema   Vessels attenuated, mild tortuosity attenuated, mild tortuosity   Periphery Attached, mild reticular degeneration, mild peripheral drusen, No heme Attached, mild reticular degeneration, peripheral paving stone degeneration, No heme           Refraction     Wearing Rx       Sphere Cylinder Axis Add   Right -2.25 +3.25 180 +3.00   Left -1.25 +3.25 171 +3.00    Type: Bifocal            IMAGING AND PROCEDURES  Imaging and Procedures for 02/24/2022  OCT, Retina - OU - Both Eyes       Right Eye Quality was good. Central Foveal  Thickness: 294. Progression has been stable. Findings include normal foveal contour, no IRF, no SRF, retinal drusen , outer retinal atrophy (Patchy ORA).   Left Eye Quality was good. Central Foveal Thickness: 322. Progression has been stable. Findings include normal foveal contour, no IRF, no SRF, retinal drusen , subretinal hyper-reflective material, pigment epithelial detachment, outer retinal atrophy (Prominent central PEDs and patchy ORA).   Notes *Images captured and stored on drive  Diagnosis / Impression:  Non-exu ARMD OU OD: Patchy ORA; no IRF/SRF OS: prominent central PEDs and patchy ORA; no IRF/SRF  Clinical management:  See below  Abbreviations: NFP - Normal foveal profile. CME - cystoid macular edema. PED - pigment epithelial detachment. IRF - intraretinal fluid. SRF - subretinal fluid. EZ - ellipsoid zone. ERM - epiretinal membrane. ORA - outer retinal atrophy. ORT - outer retinal tubulation. SRHM - subretinal hyper-reflective material. IRHM - intraretinal hyper-reflective material            ASSESSMENT/PLAN:    ICD-10-CM   1. Intermediate stage nonexudative age-related macular degeneration of both eyes  H35.3132 OCT, Retina - OU - Both Eyes    2. Essential hypertension  I10     3. Hypertensive retinopathy of both eyes  H35.033     4. Pseudophakia, both eyes  Z96.1      1. Age related macular degeneration, non-exudative, both eyes  - intermediate stage w/ no IRF/SRF OU -- stable - OCT shows OD: Patchy ORA; OS: Central PEDs and patchy ORA  - The incidence, anatomy, and pathology of dry AMD, risk of progression, and the AREDS and AREDS 2 study including smoking risks discussed with patient.  - Recommend amsler grid monitoring  - f/u 6-9 months, DFE, OCT  2,3. Hypertensive retinopathy OU - discussed importance of  tight BP control - monitor   4. Pseudophakia OU  - s/p CE/IOL OU  - IOLs in good position, doing well  - monitor   Ophthalmic Meds Ordered this visit:  No orders of the defined types were placed in this encounter.    Return for f/u 6-9 months, non-exu ARMD OU, DFE, OCT.  There are no Patient Instructions on file for this visit.   Explained the diagnoses, plan, and follow up with the patient and they expressed understanding.  Patient expressed understanding of the importance of proper follow up care.   This document serves as a record of services personally performed by Gardiner Sleeper, MD, PhD. It was created on their behalf by Roselee Nova, COMT. The creation of this record is the provider's dictation and/or activities during the visit.  Electronically signed by: Roselee Nova, COMT 02/24/22 10:02 PM  This document serves as a record of services personally performed by Gardiner Sleeper, MD, PhD. It was created on their behalf by San Jetty. Owens Shark, OA an ophthalmic technician. The creation of this record is the provider's dictation and/or activities during the visit.    Electronically signed by: San Jetty. [place], [place] 10.16.2023 10:02 PM   Gardiner Sleeper, M.D., Ph.D. Diseases & Surgery of the Retina and Vitreous [facility]  I have reviewed the above documentation for accuracy and completeness, and I agree with the above. Gardiner Sleeper, M.D., Ph.D. 02/24/22 10:02 PM   Abbreviations: M myopia (nearsighted); A astigmatism; H hyperopia (farsighted); P presbyopia; Mrx spectacle prescription;  CTL contact lenses; OD right eye; OS left eye; OU both eyes  XT exotropia; ET esotropia; PEK punctate epithelial keratitis; PEE punctate epithelial  erosions; DES dry eye syndrome; MGD meibomian gland dysfunction; ATs artificial tears; PFAT's preservative free artificial tears; NSC nuclear sclerotic cataract; PSC posterior subcapsular cataract; ERM epi-retinal membrane; PVD  posterior vitreous detachment; RD retinal detachment; DM diabetes mellitus; DR diabetic retinopathy; NPDR non-proliferative diabetic retinopathy; PDR proliferative diabetic retinopathy; CSME clinically significant macular edema; DME diabetic macular edema; dbh dot blot hemorrhages; CWS cotton wool spot; POAG primary open angle glaucoma; C/D cup-to-disc ratio; HVF humphrey visual field; GVF goldmann visual field; OCT optical coherence tomography; IOP intraocular pressure; BRVO Branch retinal vein occlusion; CRVO central retinal vein occlusion; CRAO central retinal artery occlusion; BRAO branch retinal artery occlusion; RT retinal tear; SB scleral buckle; PPV pars plana vitrectomy; VH Vitreous hemorrhage; PRP panretinal laser photocoagulation; IVK intravitreal kenalog; VMT vitreomacular traction; MH Macular hole;  NVD neovascularization of the disc; NVE neovascularization elsewhere; AREDS age related eye disease study; ARMD age related macular degeneration; POAG primary open angle glaucoma; EBMD epithelial/anterior basement membrane dystrophy; ACIOL anterior chamber intraocular lens; IOL intraocular lens; PCIOL posterior chamber intraocular lens; Phaco/IOL phacoemulsification with intraocular lens placement; PRK photorefractive keratectomy; LASIK laser assisted in situ keratomileusis; HTN hypertension; DM diabetes mellitus; COPD chronic obstructive pulmonary disease

## 2022-02-24 ENCOUNTER — Ambulatory Visit (INDEPENDENT_AMBULATORY_CARE_PROVIDER_SITE_OTHER): Payer: Medicare Other | Admitting: Ophthalmology

## 2022-02-24 ENCOUNTER — Encounter (INDEPENDENT_AMBULATORY_CARE_PROVIDER_SITE_OTHER): Payer: Self-pay | Admitting: Ophthalmology

## 2022-02-24 DIAGNOSIS — Z961 Presence of intraocular lens: Secondary | ICD-10-CM

## 2022-02-24 DIAGNOSIS — I10 Essential (primary) hypertension: Secondary | ICD-10-CM

## 2022-02-24 DIAGNOSIS — H35033 Hypertensive retinopathy, bilateral: Secondary | ICD-10-CM

## 2022-02-24 DIAGNOSIS — H353132 Nonexudative age-related macular degeneration, bilateral, intermediate dry stage: Secondary | ICD-10-CM | POA: Diagnosis not present

## 2022-05-19 DIAGNOSIS — Z0000 Encounter for general adult medical examination without abnormal findings: Secondary | ICD-10-CM | POA: Diagnosis not present

## 2022-05-19 DIAGNOSIS — E559 Vitamin D deficiency, unspecified: Secondary | ICD-10-CM | POA: Diagnosis not present

## 2022-05-19 DIAGNOSIS — D729 Disorder of white blood cells, unspecified: Secondary | ICD-10-CM | POA: Diagnosis not present

## 2022-05-19 DIAGNOSIS — R7309 Other abnormal glucose: Secondary | ICD-10-CM | POA: Diagnosis not present

## 2022-05-19 DIAGNOSIS — E782 Mixed hyperlipidemia: Secondary | ICD-10-CM | POA: Diagnosis not present

## 2022-05-20 DIAGNOSIS — H43813 Vitreous degeneration, bilateral: Secondary | ICD-10-CM | POA: Diagnosis not present

## 2022-05-20 DIAGNOSIS — H524 Presbyopia: Secondary | ICD-10-CM | POA: Diagnosis not present

## 2022-05-20 DIAGNOSIS — Z961 Presence of intraocular lens: Secondary | ICD-10-CM | POA: Diagnosis not present

## 2022-05-20 DIAGNOSIS — H04123 Dry eye syndrome of bilateral lacrimal glands: Secondary | ICD-10-CM | POA: Diagnosis not present

## 2022-05-20 DIAGNOSIS — H353133 Nonexudative age-related macular degeneration, bilateral, advanced atrophic without subfoveal involvement: Secondary | ICD-10-CM | POA: Diagnosis not present

## 2022-05-21 DIAGNOSIS — I10 Essential (primary) hypertension: Secondary | ICD-10-CM | POA: Diagnosis not present

## 2022-05-21 DIAGNOSIS — E559 Vitamin D deficiency, unspecified: Secondary | ICD-10-CM | POA: Diagnosis not present

## 2022-05-21 DIAGNOSIS — Z0000 Encounter for general adult medical examination without abnormal findings: Secondary | ICD-10-CM | POA: Diagnosis not present

## 2022-05-21 DIAGNOSIS — E782 Mixed hyperlipidemia: Secondary | ICD-10-CM | POA: Diagnosis not present

## 2022-05-21 DIAGNOSIS — L97519 Non-pressure chronic ulcer of other part of right foot with unspecified severity: Secondary | ICD-10-CM | POA: Diagnosis not present

## 2022-06-17 ENCOUNTER — Observation Stay (HOSPITAL_COMMUNITY)
Admission: EM | Admit: 2022-06-17 | Discharge: 2022-06-18 | Disposition: A | Discharge status: Home / Self Care | Payer: Medicare Other | Attending: Internal Medicine | Admitting: Internal Medicine

## 2022-06-17 ENCOUNTER — Emergency Department (HOSPITAL_COMMUNITY): Payer: Medicare Other

## 2022-06-17 ENCOUNTER — Observation Stay (HOSPITAL_COMMUNITY): Payer: Medicare Other

## 2022-06-17 ENCOUNTER — Encounter (HOSPITAL_COMMUNITY): Payer: Self-pay | Admitting: Emergency Medicine

## 2022-06-17 ENCOUNTER — Other Ambulatory Visit: Payer: Self-pay

## 2022-06-17 DIAGNOSIS — R41841 Cognitive communication deficit: Secondary | ICD-10-CM | POA: Diagnosis not present

## 2022-06-17 DIAGNOSIS — I10 Essential (primary) hypertension: Secondary | ICD-10-CM | POA: Diagnosis not present

## 2022-06-17 DIAGNOSIS — G629 Polyneuropathy, unspecified: Secondary | ICD-10-CM

## 2022-06-17 DIAGNOSIS — Z7982 Long term (current) use of aspirin: Secondary | ICD-10-CM | POA: Diagnosis not present

## 2022-06-17 DIAGNOSIS — R29810 Facial weakness: Secondary | ICD-10-CM | POA: Diagnosis not present

## 2022-06-17 DIAGNOSIS — I639 Cerebral infarction, unspecified: Secondary | ICD-10-CM | POA: Diagnosis not present

## 2022-06-17 DIAGNOSIS — R200 Anesthesia of skin: Secondary | ICD-10-CM

## 2022-06-17 DIAGNOSIS — Z79899 Other long term (current) drug therapy: Secondary | ICD-10-CM | POA: Diagnosis not present

## 2022-06-17 DIAGNOSIS — R4781 Slurred speech: Secondary | ICD-10-CM | POA: Diagnosis not present

## 2022-06-17 DIAGNOSIS — R202 Paresthesia of skin: Secondary | ICD-10-CM | POA: Diagnosis not present

## 2022-06-17 LAB — COMPREHENSIVE METABOLIC PANEL
ALT: 15 U/L (ref 0–44)
AST: 18 U/L (ref 15–41)
Albumin: 3.4 g/dL — ABNORMAL LOW (ref 3.5–5.0)
Alkaline Phosphatase: 80 U/L (ref 38–126)
Anion gap: 11 (ref 5–15)
BUN: 22 mg/dL (ref 8–23)
CO2: 28 mmol/L (ref 22–32)
Calcium: 9.4 mg/dL (ref 8.9–10.3)
Chloride: 99 mmol/L (ref 98–111)
Creatinine, Ser: 1 mg/dL (ref 0.61–1.24)
GFR, Estimated: >60 mL/min (ref >60)
Glucose, Bld: 91 mg/dL (ref 70–99)
Potassium: 3.6 mmol/L (ref 3.5–5.1)
Sodium: 138 mmol/L (ref 135–145)
Total Bilirubin: 0.6 mg/dL (ref 0.3–1.2)
Total Protein: 6.4 g/dL — ABNORMAL LOW (ref 6.5–8.1)

## 2022-06-17 LAB — CBC
HCT: 42.2 % (ref 39.0–52.0)
Hemoglobin: 13.8 g/dL (ref 13.0–17.0)
MCH: 30.1 pg (ref 26.0–34.0)
MCHC: 32.7 g/dL (ref 30.0–36.0)
MCV: 92.1 fL (ref 80.0–100.0)
Platelets: 284 10*3/uL (ref 150–400)
RBC: 4.58 MIL/uL (ref 4.22–5.81)
RDW: 12.9 % (ref 11.5–15.5)
WBC: 6.7 10*3/uL (ref 4.0–10.5)
nRBC: 0 % (ref 0.0–0.2)

## 2022-06-17 LAB — I-STAT CHEM 8, ED
BUN: 28 mg/dL — ABNORMAL HIGH (ref 8–23)
Calcium, Ion: 1.16 mmol/L (ref 1.15–1.40)
Chloride: 101 mmol/L (ref 98–111)
Creatinine, Ser: 1 mg/dL (ref 0.61–1.24)
Glucose, Bld: 89 mg/dL (ref 70–99)
HCT: 42 % (ref 39.0–52.0)
Hemoglobin: 14.3 g/dL (ref 13.0–17.0)
Potassium: 3.6 mmol/L (ref 3.5–5.1)
Sodium: 141 mmol/L (ref 135–145)
TCO2: 29 mmol/L (ref 22–32)

## 2022-06-17 LAB — DIFFERENTIAL
Abs Immature Granulocytes: 0.02 10*3/uL (ref 0.00–0.07)
Basophils Absolute: 0.1 10*3/uL (ref 0.0–0.1)
Basophils Relative: 1 %
Eosinophils Absolute: 0.1 10*3/uL (ref 0.0–0.5)
Eosinophils Relative: 2 %
Immature Granulocytes: 0 %
Lymphocytes Relative: 16 %
Lymphs Abs: 1.1 10*3/uL (ref 0.7–4.0)
Monocytes Absolute: 0.7 10*3/uL (ref 0.1–1.0)
Monocytes Relative: 10 %
Neutro Abs: 4.8 10*3/uL (ref 1.7–7.7)
Neutrophils Relative %: 71 %

## 2022-06-17 LAB — PROTIME-INR
INR: 1 (ref 0.8–1.2)
Prothrombin Time: 13.5 seconds (ref 11.4–15.2)

## 2022-06-17 LAB — ETHANOL: Alcohol, Ethyl (B): <10 mg/dL (ref <10)

## 2022-06-17 LAB — APTT: aPTT: 29 seconds (ref 24–36)

## 2022-06-17 MED ORDER — CLOPIDOGREL BISULFATE 75 MG PO TABS
75.0000 mg | ORAL_TABLET | Freq: Every day | ORAL | Status: DC
  Administered 2022-06-18: 75 mg via ORAL
  Filled 2022-06-17: qty 1

## 2022-06-17 MED ORDER — ACETAMINOPHEN 325 MG PO TABS: 650.0000 mg | ORAL_TABLET | ORAL | Status: DC | PRN

## 2022-06-17 MED ORDER — SODIUM CHLORIDE 0.9% FLUSH: 3.0000 mL | Freq: Once | INTRAVENOUS | Status: DC

## 2022-06-17 MED ORDER — GABAPENTIN 300 MG PO CAPS
300.0000 mg | ORAL_CAPSULE | Freq: Every day | ORAL | Status: DC
  Administered 2022-06-17: 300 mg via ORAL
  Filled 2022-06-17: qty 1

## 2022-06-17 MED ORDER — SENNOSIDES-DOCUSATE SODIUM 8.6-50 MG PO TABS: 1.0000 | ORAL_TABLET | Freq: Every evening | ORAL | Status: DC | PRN

## 2022-06-17 MED ORDER — ASPIRIN 81 MG PO TBEC
81.0000 mg | DELAYED_RELEASE_TABLET | Freq: Every day | ORAL | Status: DC
  Administered 2022-06-18: 81 mg via ORAL
  Filled 2022-06-17: qty 1

## 2022-06-17 MED ORDER — STROKE: EARLY STAGES OF RECOVERY BOOK
Freq: Once | Status: DC
  Filled 2022-06-17 (×2): qty 1

## 2022-06-17 MED ORDER — ACETAMINOPHEN 650 MG RE SUPP: 650.0000 mg | RECTAL | Status: DC | PRN

## 2022-06-17 MED ORDER — ENOXAPARIN SODIUM 40 MG/0.4ML IJ SOSY: 40.0000 mg | PREFILLED_SYRINGE | INTRAMUSCULAR | Status: DC

## 2022-06-17 MED ORDER — ACETAMINOPHEN 160 MG/5ML PO SOLN: 650.0000 mg | ORAL | Status: DC | PRN

## 2022-06-17 NOTE — ED Provider Notes (Signed)
[facility] Provider Note   CSN: [id number] Arrival date & time: 06/17/22  1320     History  Chief Complaint  Patient presents with   Stroke Symptoms    [name] is a 89 y.o. male with history of hypertension and macular degeneration who presents to the emergency department complaining of numbness in his left arm/hand.  Patient initially woke up at 830 this morning and noticed the symptoms, last known normal was 12:20 AM.  He thought that he had just slept on his arm wrong.  He continued up through his day, and when he was working with his daughter later she thought that his face was drooping and his speech was abnormal.  She noticed this at about 11:50 AM.  That is when the patient did mention the numbness in his arm.  When EMS arrived there is no facial droop present.  Wife reports that she feels the speech is not his normal.  At this point patient still complaining of some numbness in the left lower arm and hand, and says his left eye feels slightly blurry.  No headache.  No history of stroke.  HPI     Home Medications Prior to Admission medications   Medication Sig Start Date End Date Taking? Authorizing Provider  Ascorbic Acid (VITAMIN C PO) Take 1 tablet by mouth every Monday, Wednesday, and Friday. Takes along with vit e    [provider]  aspirin 325 MG tablet 1/2 tablet    [provider]  aspirin 81 MG tablet Take 81 mg by mouth daily. Patient not taking: Reported on 08/26/2021    [provider]  Azelastine HCl 137 MCG/SPRAY SOLN 1 puff in each nostril 04/27/19   [provider]  Calcium Carb-Cholecalciferol (CALCIUM 600 + D PO) Take 1 tablet by mouth 2 (two) times daily. Takes at lunch 1200    [provider]  gabapentin (NEURONTIN) 300 MG capsule 1 capsule 08/09/20   [provider]  lisinopril (PRINIVIL,ZESTRIL) 20 MG tablet Take 20 mg by mouth daily.    [provider]  lisinopril (ZESTRIL) 20 MG tablet Take 1 tablet by mouth daily. Patient not taking: Reported on 08/26/2021    [provider]  lisinopril-hydrochlorothiazide (PRINZIDE,ZESTORETIC) 20-12.5 MG per tablet Take 1 tablet by mouth daily.    [provider]  montelukast (SINGULAIR) 10 MG tablet 1 tablet 04/27/19   [provider]  Multiple Vitamin (MULTIVITAMIN WITH MINERALS) TABS tablet Take 0.5 tablets by mouth 2 (two) times daily.    [provider]  Multiple Vitamins-Minerals (EYE-VITE PLUS LUTEIN) CAPS Take 1 capsule by mouth daily.    [provider]  naproxen sodium (ALEVE) 220 MG tablet Take 1 tablet (220 mg total) by mouth 2 (two) times daily with a meal. 08/15/13   Kirichenko, [place], PA-C  Omega-3 Fatty Acids (FISH OIL) 1000 MG CAPS Take 1 capsule by mouth 2 (two) times daily.    [provider]  oxyCODONE (OXY IR/ROXICODONE) 5 MG immediate release tablet Take 1 tablet (5 mg total) by mouth every 4 (four) hours as needed for severe pain. 08/08/13   Larene Pickett, PA-C  predniSONE (DELTASONE) 20 MG tablet 2 tablets Patient not taking: Reported on 08/26/2021 12/24/20   [provider]  Red Yeast Rice 600 MG CAPS 1 tablet    [provider]  Red Yeast Rice 600 MG TABS Take 600 mg by mouth every Tuesday, Thursday,  and Saturday at 6 PM. Takes one time at lunch    [provider]  traMADol Veatrice Bourbon) 50 MG tablet 1 tablet as needed 08/09/20   [provider]  vitamin E 180 MG (400 UNITS) capsule Take 400 Units by mouth every Monday, Wednesday, and Friday.    [provider]      Allergies    Prednisone    Review of Systems   Review of Systems  Neurological:  Positive for facial asymmetry, speech difficulty and weakness. Negative for headaches.  All other systems reviewed and are negative.   Physical Exam Updated Vital Signs BP (!) 145/101   Pulse 79   Temp 97.8 F (36.6 C) (Oral)    Resp (!) 23   SpO2 100%  Physical Exam Vitals and nursing note reviewed.  Constitutional:      Appearance: Normal appearance.  HENT:     Head: Normocephalic and atraumatic.  Eyes:     Conjunctiva/sclera: Conjunctivae normal.  Cardiovascular:     Rate and Rhythm: Normal rate and regular rhythm.  Pulmonary:     Effort: Pulmonary effort is normal. No respiratory distress.     Breath sounds: Normal breath sounds.  Abdominal:     General: There is no distension.     Palpations: Abdomen is soft.     Tenderness: There is no abdominal tenderness.  Skin:    General: Skin is warm and dry.  Neurological:     General: No focal deficit present.     Mental Status: He is alert.     Comments: Left-sided facial droop and garbled speech.  Decree sensation to the left jaw.  Decree sensation to the left hand and left forearm.  4/5 grip strength to the left.  No pronator drift.  Normal strength and sensation of the lower extremities.  No neglect or gaze deviation.  PERRLA.  EOMI.     ED Results / Procedures / Treatments   Labs (all labs ordered are listed, but only abnormal results are displayed) Labs Reviewed  COMPREHENSIVE METABOLIC PANEL - Abnormal; Notable for the following components:      Result Value   Total Protein 6.4 (*)    Albumin 3.4 (*)    All other components within normal limits  I-STAT CHEM 8, ED - Abnormal; Notable for the following components:   BUN 28 (*)    All other components within normal limits  PROTIME-INR  APTT  CBC  DIFFERENTIAL  ETHANOL  CBG MONITORING, ED    EKG None  Radiology No results found.  Procedures Procedures    Medications Ordered in ED Medications  sodium chloride flush (NS) 0.9 % injection 3 mL (3 mLs Intravenous Not Given 06/17/22 1356)    ED Course/ Medical Decision Making/ A&P                             Medical Decision Making Amount and/or Complexity of Data Reviewed Labs: ordered. Radiology: ordered.  This patient is a  89 y.o. male  who presents to the ED for concern of left arm/hand numbness, slurred speech, facial droop. LNK 12:20 am.   Differential diagnoses prior to evaluation: The emergent differential diagnosis includes, but is not limited to,  CVA, TIA. This is not an exhaustive differential.   Past Medical History / Co-morbidities: hypertension and macular degeneration  Additional history: Chart reviewed. Pertinent results include:   Physical Exam: Physical exam performed. The pertinent findings include:  L sided facial droop, garbled speech. L hand/forearm decreased sensation. 4/5 grip strength to L. No pronator drift. Normal strength and sensation of lower extremities.  No neglect or gaze deviation.  Lab Tests/Imaging studies: I personally interpreted labs/imaging and the pertinent results include: CBC and metabolic panel unremarkable.  MRI brain pending.   Cardiac monitoring: EKG obtained and interpreted by my attending physician which shows: sinus rhythm with RBBB   Consultations obtained: I consulted with neurologist Dr. Malen Gauze who recommends MRI and reconsultation of neurology if significant findings. Patient VAN negative, and out of window Code Stroke window.    Disposition: Patient discussed and care transferred to [facility] at shift change. Please see his/her note for further details regarding further ED course and disposition. Plan at time of handoff is follow up on MRI results and consult neurology depending. Anticipate admission for further stroke workup.   I discussed this case with my attending physician Dr. Laverta Baltimore who cosigned this note including patient's presenting symptoms, physical exam, and planned diagnostics and interventions. Attending physician stated agreement with plan or made changes to plan which were implemented.   Final Clinical Impression(s) / ED Diagnoses Final diagnoses:  Left arm numbness    Rx / DC Orders ED Discharge Orders     None      Portions  of this report may have been transcribed using voice recognition software. Every effort was made to ensure accuracy; however, inadvertent computerized transcription errors may be present.    Estill Cotta 06/17/22 1524    Margette Fast, MD 06/20/22 [phone]

## 2022-06-17 NOTE — ED Provider Notes (Cosign Needed Addendum)
Signout from [employer] at shift change. Briefly, patient presents for left arm numbness, left hand clumsiness, left-sided facial droop, last known well was last night.  Van negative.   Plan: MRI brain pending   4:10 PM Reassessment performed. Patient appears stable.  Labs and imaging personally reviewed and interpreted including: MRI showing acute stroke.  IMPRESSION: 1. 1 cm acute infarction in the right posterior frontal white  matter, probably external capsule merging with corona radiata. No  swelling or hemorrhage.    Reviewed additional pertinent lab work and imaging with patient at bedside including: CBC unremarkable; CMP unremarkable, PT INR neg, aPTT neg.    Most current vital signs reviewed and are as follows: BP (!) 145/101   Pulse 79   Temp 97.8 F (36.6 C) (Oral)   Resp (!) 23   SpO2 100%   Plan: Admit to hospitalist  Dr. Rory Percy of neuro hospitalist aware.  They will consult.   4:39 PM Consulted with [facility] (Dr. Trilby Drummer) who will see for admission.      Carlisle Cater, PA-C 06/17/22 1641    Dorie Rank, MD 06/19/22 Dyann Kief

## 2022-06-17 NOTE — Consult Note (Signed)
NEUROLOGY CONSULTATION PROGRESS NOTE   Date of service: June 17, 2022 Patient Name: [name] MRN:  [id number] DOB:  1932/07/20  Brief HPI  [name] is a 89 y.o. male with PMH significant for hypertension and macular degeneration who presents to the ED complaining of numbness and weakness in his left arm/hand. Patient initially woke up at 830 this morning and noticed the symptoms, last known normal was 12:20 AM. He thought that he had just slept on his arm wrong. He continued up through his day, and when he was working with his daughter later she thought that his face was drooping and his speech was abnormal. She noticed this at about 11:50 AM. Patient BIB EMS. MRI imaging positive for Right posterior frontal white matter infarction, negative for hemorrhage.  On assessment, positive for left facial droop, slight drift in left leg.  Denies numbness. Imaging, assessment and plan of care reviewed with patient, son and wife at bedside.  Patient confirms he takes one half of a 325 aspirin at home.  Assessment and recommendations reviewed with ED provider and hospitalist.  LKW: 1220AM mRS: 0 tNKASE: No, outside of window Thrombectomy: No, no LVO NIHSS components Score: Comment  1a Level of Conscious 0[x]  1[]  2[]  3[]      1b LOC Questions 0[x]  1[]  2[]       1c LOC Commands 0[x]  1[]  2[]       2 Best Gaze 0[x]  1[]  2[]       3 Visual 0[x]  1[]  2[]  3[]      4 Facial Palsy 0[]  1[x]  2[]  3[]      5a Motor Arm - left 0[x]  1[]  2[]  3[]  4[]  UN[]    5b Motor Arm - Right 0[x]  1[]  2[]  3[]  4[]  UN[]    6a Motor Leg - Left 0[]  1[x]  2[]  3[]  4[]  UN[]    6b Motor Leg - Right 0[x]  1[]  2[]  3[]  4[]  UN[]    7 Limb Ataxia 0[x]  1[]  2[]  3[]  UN[]     8 Sensory 0[x]  1[]  2[]  UN[]      9 Best Language 0[x]  1[]  2[]  3[]      10 Dysarthria 0[x]  1[]  2[]  UN[]      11 Extinct. and Inattention 0[x]  1[]  2[]       TOTAL:   2       Interval Hx   [name] is a 89 y.o. male with PMH significant for hypertension and macular  degeneration who presents to the ED complaining of numbness and weakness in his left arm/hand.  Assessment positive for left facial droop and slight left leg weakness.   MRI positive for acute infarction in the right posterior frontal white matter, negative for hemorrhage.  Recommend full stroke workup.  Imaging, assessment and plan of care reviewed with patient and family at bedside.  Vitals   Vitals:   06/17/22 1335 06/17/22 1400  BP: (!) 155/82 (!) 145/101  Pulse: 71 79  Resp: 20 (!) 23  Temp: 97.8 F (36.6 C)   TempSrc: Oral   SpO2: 100% 100%     There is no height or weight on file to calculate BMI.  Physical Exam   General: Laying comfortably in bed; in no acute distress.  HENT: Normal oropharynx and mucosa. Normal external appearance of ears and nose.  Neck: Hunched appearance, baseline per patient, with limited ROM.  CV: No JVD. No peripheral edema. ST on monitor.  Pulmonary: Symmetric Chest rise. Normal respiratory effort.  Abdomen: Soft to touch, non-tender.  Ext: No cyanosis, edema, or deformity Skin: No rash.  Normal palpation of skin.   Musculoskeletal: Normal digits and nails by inspection.   Neurologic Examination  Mental status/Cognition: Alert, oriented to self, place, month and year, good attention.  Speech/language: Fluent, comprehension intact, object naming intact, repetition intact.  Cranial nerves:   CN II Pupils equal and reactive to light, no VF deficits    CN III,IV,VI EOM intact, no gaze preference or deviation, no nystagmus    CN V normal sensation bilaterally    CN VII Left facial droop   CN VIII normal hearing to speech    CN IX & X normal palatal elevation, no uvular deviation    CN XI Limited head turn to either side (baseline per patient); symmetric shoulder shrug bilaterally    CN XII midline tongue protrusion    Motor:  RUE: 5/5 RLE: 5/5 LUE: 4/5 LLE: 4-/5 Bulk and tone normal. Slight drift seen in LLE. No tremor.    Sensation: Symmetric to light touch throughout.  Coordination/Complex Motor:  - Finger to Nose: intact, ataxic on left - Heel to shin: intact & symmetric  - Gait: deferred  Labs   Basic Metabolic Panel:  Lab Results  Component Value Date   NA 141 06/17/2022   K 3.6 06/17/2022   CO2 28 06/17/2022   GLUCOSE 89 06/17/2022   BUN 28 (H) 06/17/2022   CREATININE 1.00 06/17/2022   CALCIUM 9.4 06/17/2022   GFRNONAA >60 06/17/2022   GFRAA >90 08/15/2013   HbA1c: No results found for: "HGBA1C" LDL: No results found for: "LDLCALC" Urine Drug Screen: No results found for: "LABOPIA", "COCAINSCRNUR", "LABBENZ", "AMPHETMU", "THCU", "LABBARB"  Alcohol Level     Component Value Date/Time   ETH <10 06/17/2022 1348   No results found for: "PHENYTOIN", "ZONISAMIDE", "LAMOTRIGINE", "LEVETIRACETA" No results found for: "PHENYTOIN", "PHENOBARB", "VALPROATE", "CBMZ"  Imaging and Diagnostic studies  Results for orders placed during the hospital encounter of 06/17/22  MR BRAIN WO CONTRAST  Narrative CLINICAL DATA:  Neuro deficit, acute, stroke suspected.  EXAM: MRI HEAD WITHOUT CONTRAST  TECHNIQUE: Multiplanar, multiecho pulse sequences of the brain and surrounding structures were obtained without intravenous contrast.  COMPARISON:  Head CT 08/15/2013  FINDINGS: Brain: There is a background pattern of chronic small vessel ischemic change throughout the pons and cerebral hemispheric white matter. There is an acute infarction within the right posterior frontal white matter, probably external capsule merging with corona radiata. This measures about 1 cm in size. No other acute infarction. No mass lesion, hemorrhage, hydrocephalus or extra-axial collection. Scattered punctate foci of hemosiderin deposition present in association with some of the old small vessel infarctions.  Vascular: Major vessels at the base of the brain show flow.  Skull and upper cervical spine:  Negative  Sinuses/Orbits: Paranasal sinuses are clear.  Orbits are normal.  Other: Mastoid effusion on the right. No posterior nasopharyngeal mass is seen. 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  IMPRESSION: 1. 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 No swelling or hemorrhage. 2. Background pattern of chronic small-vessel ischemic changes throughout the pons and cerebral hemispheric white matter.   Electronically Signed By: Nelson Chimes M.D. On: 06/17/2022 15:54   Impression   [name] is a 89 y.o. male with PMH significant for hypertension and macular degeneration. His neurologic examination is notable for left facial droop and slight left leg weakness. MRI positive for acute infarction in the right posterior frontal white matter, negative  for hemorrhage.    Acute Ischemic Stroke Thrombotic vs embolic, pending further imaging/testing for definitive etiology  Recommendations  - Frequent Neuro checks per stroke unit protocol - Vascular imaging - CT Angio head and neck - TTE - Lipid panel - Statin - will be started if LDL>70 or otherwise medically indicated - A1C - Antithrombotic - Recommend Aspirin/Plavix x 3 weeks, then aspirin alone.  This recommendation may change based on the vessel imaging results. - DVT ppx - SCDs - SBP goal - <220, PRN labetalol if HR>60 and PRN Hydralazine if HR<60 - Telemetry monitoring for arrhythmia - 72h - Swallow screen - will be performed prior to PO intake - Stroke education - will be given - PT/OT/SLP - Possible need for 30 day monitor or loop recorder on discharge to assess for arrhythmias further.  ______________________________________________________________________   Thank you for the opportunity to take part in the care of this patient. If you have any further questions, please contact the neurology consultation  attending.   Pt seen by Neuro NP/APP and later by MD. Note/plan to be edited by MD as needed.    Otelia Santee, DNP, AGACNP-BC [facility]    Attending Neurohospitalist Addendum Patient seen and examined with APP/Resident. Agree with the history and physical as documented above. Agree with the plan as documented, which I helped formulate. I have independently reviewed the chart, obtained history, review of systems and examined the patient.I have personally reviewed pertinent head/neck/spine imaging (CT/MRI). Plan discussed with patient, son and wife at bedside. Plan also discussed with Dr. Trilby Drummer via secure chat. Please feel free to call with any questions.  -- Amie Portland, MD Neurologist [facility] Pager: [phone]

## 2022-06-17 NOTE — H&P (Signed)
History and Physical   [name] NWG:[id number] DOB: Nov 05, 1932 DOA: 06/17/2022  PCP: Maurice Small, MD   Patient coming from: Home  Chief Complaint: Focal neurologic deficits  HPI: [name] is a 89 y.o. male with medical history significant of macular degeneration, cataracts, right ankle wound, hypertension, neuropathy, cervical spine fracture presenting with focal neurologic deficits.  Patient reports waking up with left arm and hand numbness.  Was last normal at 12 AM and woke with this numbness at around 8:30 AM, he initially attributed to sleeping on the arm wrong.  It persisted throughout the day and then later in the day his daughter felt like she noticed abnormal speech and possible facial droop.  This was around noon.  On arrival to the ED noted to have some residual numbness and abnormal speech.  No facial droop on arrival.  Also is reporting some left eye blurriness and pain and numbness, facial numbness has improved.  Denies fevers, chills, chest pain, shortness of breath, abdominal pain, constipation, diarrhea, nausea, vomiting.  ED Course: Vital signs in ED significant for blood pressure in the 578I to 696E systolic, respiratory rate in the teens to 20s.  Lab workup included CMP with protein 6.4, albumin 3.4.  CBC within normal limits.  PT, PTT, INR within normal limits.  Ethanol level negative.  MRI brain showed 1 cm acute infarct in the right frontal lobe posteriorly.  Neurology has been consulted in the ED and will see the patient.  Review of Systems: As per HPI otherwise all other systems reviewed and are negative.  Past Medical History:  Diagnosis Date   Hypertension    Macular degeneration     Past Surgical History:  Procedure Laterality Date   CATARACT EXTRACTION     EYE SURGERY     ROTATOR CUFF REPAIR Bilateral     Social History  reports that he has never smoked. He does not have any smokeless tobacco history on file. He reports that he does not drink  alcohol and does not use drugs.  Allergies  Allergen Reactions   Prednisone     History reviewed. No pertinent family history.  Prior to Admission medications   Medication Sig Start Date End Date Taking? Authorizing Provider  Ascorbic Acid (VITAMIN C PO) Take 1 tablet by mouth every Monday, Wednesday, and Friday. Takes along with vit e    [provider]  aspirin 325 MG tablet 1/2 tablet    [provider]  aspirin 81 MG tablet Take 81 mg by mouth daily. Patient not taking: Reported on 08/26/2021    [provider]  Azelastine HCl 137 MCG/SPRAY SOLN 1 puff in each nostril 04/27/19   [provider]  Calcium Carb-Cholecalciferol (CALCIUM 600 + D PO) Take 1 tablet by mouth 2 (two) times daily. Takes at lunch 1200    [provider]  gabapentin (NEURONTIN) 300 MG capsule 1 capsule 08/09/20   [provider]  lisinopril (PRINIVIL,ZESTRIL) 20 MG tablet Take 20 mg by mouth daily.    [provider]  lisinopril-hydrochlorothiazide (PRINZIDE,ZESTORETIC) 20-12.5 MG per tablet Take 1 tablet by mouth daily.    [provider]  montelukast (SINGULAIR) 10 MG tablet 1 tablet 04/27/19   [provider]  Multiple Vitamin (MULTIVITAMIN WITH MINERALS) TABS tablet Take 0.5 tablets by mouth 2 (two) times daily.    [provider]  Multiple Vitamins-Minerals (EYE-VITE PLUS LUTEIN) CAPS Take 1 capsule by mouth daily.    [provider]  naproxen sodium (ALEVE) 220 MG tablet Take 1 tablet (220 mg total) by mouth 2 (two) times daily with a meal. 08/15/13   Kirichenko, [place], PA-C  Omega-3 Fatty Acids (FISH OIL) 1000 MG CAPS Take 1 capsule by mouth 2 (two) times daily.    [provider]  oxyCODONE (OXY IR/ROXICODONE) 5 MG immediate release tablet Take 1 tablet (5 mg total) by mouth every 4 (four) hours as needed for severe pain. 08/08/13   Larene Pickett, PA-C  Red Yeast Rice 600 MG CAPS 1 tablet    [provider]  Red Yeast Rice 600 MG TABS Take 600 mg by mouth every Tuesday, Thursday, and Saturday at 6 PM. Takes one time at lunch    [provider]  traMADol Veatrice Bourbon) 50 MG tablet 1 tablet as needed 08/09/20   [provider]  vitamin E 180 MG (400 UNITS) capsule Take 400 Units by mouth every Monday, Wednesday, and Friday.    [provider]    Physical Exam: Vitals:   06/17/22 1335 06/17/22 1400  BP: (!) 155/82 (!) 145/101  Pulse: 71 79  Resp: 20 (!) 23  Temp: 97.8 F (36.6 C)   TempSrc: Oral   SpO2: 100% 100%    Physical Exam Constitutional:      General: He is not in acute distress.    Appearance: Normal appearance.  HENT:     Head: Normocephalic and atraumatic.     Mouth/Throat:     Mouth: Mucous membranes are moist.     Pharynx: Oropharynx is clear.  Eyes:     Extraocular Movements: Extraocular movements intact.     Pupils: Pupils are equal, round, and reactive to light.  Cardiovascular:     Rate and Rhythm: Normal rate and regular rhythm.     Pulses: Normal pulses.     Heart sounds: Normal heart sounds.  Pulmonary:     Effort: Pulmonary effort is normal. No respiratory distress.     Breath sounds: Normal breath sounds.  Abdominal:     General: Bowel sounds are normal. There is no distension.     Palpations: Abdomen is soft.     Tenderness: There is no abdominal tenderness.  Musculoskeletal:        General: No swelling or deformity.  Skin:    General: Skin is warm and dry.  Neurological:     Comments: Mental Status: Patient is awake, alert, oriented x3 No signs of aphasia or neglect Cranial Nerves: II: Pupils equal, round, and reactive to light.   III,IV, VI: EOMI without ptosis or diploplia.  V: Facial sensation is symmetric to light touch. VII: Facial movement is symmetric.  VIII: hearing is intact to voice X: Uvula elevates symmetrically XI: Shoulder shrug is symmetric. XII: tongue is midline without atrophy or  fasciculations.  Motor: Good effort thorughout, at Least 5/5 bilateral UE, 5/5 bilateral lower extremitiy  Sensory: Sensation decreased left upper extremity. Cerebellar: Finger-Nose slower and less precise on left    Labs on Admission: I have personally reviewed following labs and imaging studies  CBC: Recent Labs  Lab 06/17/22 1348 06/17/22 1411  WBC 6.7  --   NEUTROABS 4.8  --   HGB 13.8 14.3  HCT 42.2 42.0  MCV 92.1  --   PLT 284  --     Basic Metabolic Panel: Recent Labs  Lab 06/17/22 1348 06/17/22 1411  NA 138 141  K 3.6 3.6  CL 99 101  CO2 28  --  GLUCOSE 91 89  BUN 22 28*  CREATININE 1.00 1.00  CALCIUM 9.4  --     GFR: CrCl cannot be calculated (Unknown ideal weight.).  Liver Function Tests: Recent Labs  Lab 06/17/22 1348  AST 18  ALT 15  ALKPHOS 80  BILITOT 0.6  PROT 6.4*  ALBUMIN 3.4*    Urine analysis:    Component Value Date/Time   COLORURINE YELLOW 12/31/2009 1024   APPEARANCEUR CLEAR 12/31/2009 1024   LABSPEC 1.021 12/31/2009 1024   PHURINE 6.0 12/31/2009 1024   GLUCOSEU NEGATIVE 12/31/2009 1024   HGBUR NEGATIVE 12/31/2009 1024   BILIRUBINUR NEGATIVE 12/31/2009 1024   KETONESUR NEGATIVE 12/31/2009 1024   PROTEINUR NEGATIVE 12/31/2009 1024   UROBILINOGEN 0.2 12/31/2009 1024   NITRITE NEGATIVE 12/31/2009 1024   LEUKOCYTESUR TRACE (A) 12/31/2009 1024    Radiological Exams on Admission: MR BRAIN WO CONTRAST  Result Date: 06/17/2022 CLINICAL DATA:  Neuro deficit, acute, stroke suspected. EXAM: MRI HEAD WITHOUT CONTRAST TECHNIQUE: Multiplanar, multiecho pulse sequences of the brain and surrounding structures were obtained without intravenous contrast. COMPARISON:  Head CT 08/15/2013 FINDINGS: Brain: There is a background pattern of chronic small vessel ischemic change throughout the pons and cerebral hemispheric white matter. There is an acute infarction within the right posterior frontal white matter, probably external capsule merging  with corona radiata. This measures about 1 cm in size. No other acute infarction. No mass lesion, hemorrhage, hydrocephalus or extra-axial collection. Scattered punctate foci of hemosiderin deposition present in association with some of the old small vessel infarctions. Vascular: Major vessels at the base of the brain show flow. Skull and upper cervical spine: Negative Sinuses/Orbits: Paranasal sinuses are clear.  Orbits are normal. Other: Mastoid effusion on the right. No posterior nasopharyngeal mass is seen. 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 IMPRESSION: 1. 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 No swelling or hemorrhage. 2. Background pattern of chronic small-vessel ischemic changes throughout the pons and cerebral hemispheric white matter. Electronically Signed   By: Paulina Fusi M.D.   On: 06/17/2022 15:54    EKG: Independently reviewed.  Sinus rhythm at 75 bpm.  Multiple PVCs noted.  Right bundle branch block.  Assessment/Plan Principal Problem:   Acute CVA (cerebrovascular accident) (HCC) Active Problems:   Neuropathy   HTN (hypertension)  Acute CVA > Woke up with left upper extremity numbness.  Later noted to have speech abnormality and possible facial droop.  On arrival continued left upper extremity numbness and speech abnormality.  Also reporting some blurry vision on the left. > MRI with acute stroke.  Neurology aware and are following. - Neurology consult - Allow for permissive HTN (systolic < 220 and diastolic < 120)  - ASA 81 mg and Plavix 75 mg daily for 3 weeks, followed by aspirin alone - Statin if appropriate pending lipid panel - Echocardiogram  - Carotid doppler, CTA head & neck  - A1C  - Lipid panel  - Tele monitoring  - SLP eval - PT/OT  Hypertension > Takes both lisinopril-hydrochlorothiazide combination pill and lisinopril alone at different  times during the day. - Holding home lisinopril and lisinopril-hydrochlorothiazide in the setting of permissive hypertension as above  History of cervical spinal fracture Neuropathy - Continue home gabapentin  Chronic right ankle wound > Follows with wound care outpatient, nearly healed per patient  DVT prophylaxis: Lovenox Code Status:   Full, discussed on admission Family Communication:  Updated at bedside Disposition Plan:   Patient is from:  Home  Anticipated DC to:  Home  Anticipated DC date:  1 to 2 days  Anticipated DC barriers: None  Consults called:  Neurology Admission status:  Observation, telemetry  Severity of Illness: The appropriate patient status for this patient is OBSERVATION. Observation status is judged to be reasonable and necessary in order to provide the required intensity of service to ensure the patient's safety. The patient's presenting symptoms, physical exam findings, and initial radiographic and laboratory data in the context of their medical condition is felt to place them at decreased risk for further clinical deterioration. Furthermore, it is anticipated that the patient will be medically stable for discharge from the hospital within 2 midnights of admission.    Marcelyn Bruins MD [facility]  How to contact the [facility] Attending or Consulting provider [place] or covering provider during after hours [place], for this patient?   Check the care team in [facility] and look for a) attending/consulting [facility] provider listed and b) the [facility] team listed Log into [email or web address] and use [place]'s universal password to access. If you do not have the password, please contact the hospital operator. Locate the [facility] provider you are looking for under [facility] and page to a number that you can be directly reached. If you still have difficulty reaching the provider, please page the [facility] (Director on Call) for the Hospitalists listed on amion for  assistance.  06/17/2022, 5:26 PM

## 2022-06-17 NOTE — ED Triage Notes (Signed)
Pt arrives via EMS-[facility]N 1220am today. woke up 0830 with numbness in left arm/hand. Pt thought he slept wrong. Pt proceeded to do yard work after waking up- dtr noticed that his left side of his face looked like it was drooping/ slurred at 1150 am this morning. Pt mentioned about the numbness in his arm to his family at this time. When EMS arrived, no facial droop present. Pt's wife states she felt like his speech is not what it normally is. Numbness still present in left arm. No weakness present, no drift. Pt also states his left eye feels blurry compared to right. No hx stroke  BP 157/83, HR 80's with PVCs, CBG 115, resp 16, 98% on room air

## 2022-06-17 NOTE — ED Notes (Signed)
ED TO INPATIENT HANDOFF REPORT  ED Nurse Name and Phone #: Caryl Pina RN [phone]  S Name/Age/Gender [name] 89 y.o. male Room/Bed: 017C/017C  Code Status   Code Status: Full Code  Home/SNF/Other Home Patient oriented to: self, place, time, and situation Is this baseline? Yes   Triage Complete: Triage complete  Chief Complaint Acute CVA (cerebrovascular accident) [facility]) [I63.9]  Triage Note Pt arrives via EMS-LSN 1220am today. woke up 0830 with numbness in left arm/hand. Pt thought he slept wrong. Pt proceeded to do yard work after waking up- dtr noticed that his left side of his face looked like it was drooping/ slurred at 1150 am this morning. Pt mentioned about the numbness in his arm to his family at this time. When EMS arrived, no facial droop present. Pt's wife states she felt like his speech is not what it normally is. Numbness still present in left arm. No weakness present, no drift. Pt also states his left eye feels blurry compared to right. No hx stroke  BP 157/83, HR 80's with PVCs, CBG 115, resp 16, 98% on room air   Allergies Allergies  Allergen Reactions   Prednisone     Level of Care/Admitting Diagnosis ED Disposition     ED Disposition  Admit   Condition  --   [place]: [place] [[id number]]  Level of Care: Telemetry Medical [104]  May place patient in observation at [facility] or [place] if equivalent level of care is available:: No  Covid Evaluation: Asymptomatic - no recent exposure (last 10 days) testing not required  Diagnosis: Acute CVA (cerebrovascular accident) [facility]) [[id number]]  Admitting Physician: Marcelyn Bruins [[id number]]  Attending Physician: Marcelyn Bruins [phone]          B Medical/Surgery History Past Medical History:  Diagnosis Date   Hypertension    Macular degeneration    Past Surgical History:  Procedure Laterality Date   CATARACT EXTRACTION     EYE SURGERY     ROTATOR CUFF  REPAIR Bilateral      A IV Location/Drains/Wounds Patient Lines/Drains/Airways Status     Active Line/Drains/Airways     Name Placement date Placement time Site Days   Peripheral IV 06/17/22 18 G Left Antecubital 06/17/22  1344  Antecubital  less than 1            Intake/Output Last 24 hours No intake or output data in the 24 hours ending 06/17/22 1649  Labs/Imaging Results for orders placed or performed during the hospital encounter of 06/17/22 (from the past 48 hour(s))  Protime-INR     Status: None   Collection Time: 06/17/22  1:48 PM  Result Value Ref Range   Prothrombin Time 13.5 11.4 - 15.2 seconds   INR 1.0 0.8 - 1.2    Comment: (NOTE) INR goal varies based on device and disease states. Performed at [facility], [place] [address], [place], [place] [address]   APTT     Status: None   Collection Time: 06/17/22  1:48 PM  Result Value Ref Range   aPTT 29 24 - 36 seconds    Comment: Performed at [place] [address], [place] [address]  CBC     Status: None   Collection Time: 06/17/22  1:48 PM  Result Value Ref Range   WBC 6.7 4.0 - 10.5 K/uL   RBC 4.58 4.22 - 5.81 MIL/uL   Hemoglobin 13.8 13.0 - 17.0  g/dL   HCT 42.2 39.0 - 52.0 %   MCV 92.1 80.0 - 100.0 fL   MCH 30.1 26.0 - 34.0 pg   MCHC 32.7 30.0 - 36.0 g/dL   RDW 12.9 11.5 - 15.5 %   Platelets 284 150 - 400 K/uL   nRBC 0.0 0.0 - 0.2 %    Comment: Performed at [facility], [place] [address], [place], [place] [address]  Differential     Status: None   Collection Time: 06/17/22  1:48 PM  Result Value Ref Range   Neutrophils Relative % 71 %   Neutro Abs 4.8 1.7 - 7.7 K/uL   Lymphocytes Relative 16 %   Lymphs Abs 1.1 0.7 - 4.0 K/uL   Monocytes Relative 10 %   Monocytes Absolute 0.7 0.1 - 1.0 K/uL   Eosinophils Relative 2 %   Eosinophils Absolute 0.1 0.0 - 0.5 K/uL   Basophils Relative 1 %   Basophils Absolute 0.1 0.0 - 0.1 K/uL   Immature Granulocytes 0 %   Abs Immature  Granulocytes 0.02 0.00 - 0.07 K/uL    Comment: Performed at [place] [address], [place], [place] [address]  Comprehensive metabolic panel     Status: Abnormal   Collection Time: 06/17/22  1:48 PM  Result Value Ref Range   Sodium 138 135 - 145 mmol/L   Potassium 3.6 3.5 - 5.1 mmol/L   Chloride 99 98 - 111 mmol/L   CO2 28 22 - 32 mmol/L   Glucose, Bld 91 70 - 99 mg/dL    Comment: Glucose reference range applies only to samples taken after fasting for at least 8 hours.   BUN 22 8 - 23 mg/dL   Creatinine, Ser 1.00 0.61 - 1.24 mg/dL   Calcium 9.4 8.9 - 10.3 mg/dL   Total Protein 6.4 (L) 6.5 - 8.1 g/dL   Albumin 3.4 (L) 3.5 - 5.0 g/dL   AST 18 15 - 41 U/L   ALT 15 0 - 44 U/L   Alkaline Phosphatase 80 38 - 126 U/L   Total Bilirubin 0.6 0.3 - 1.2 mg/dL   GFR, Estimated >60 >60 mL/min    Comment: (NOTE) Calculated using the CKD-EPI Creatinine Equation (2021)    Anion gap 11 5 - 15    Comment: Performed at [facility], [place] [address], [place], [place] [address]  Ethanol     Status: None   Collection Time: 06/17/22  1:48 PM  Result Value Ref Range   Alcohol, Ethyl (B) <10 <10 mg/dL    Comment: (NOTE) Lowest detectable limit for serum alcohol is 10 mg/dL.  For medical purposes only. Performed at [facility], [place] [address] [place], [place] [address]   I-stat chem 8, ED     Status: Abnormal   Collection Time: 06/17/22  2:11 PM  Result Value Ref Range   Sodium 141 135 - 145 mmol/L   Potassium 3.6 3.5 - 5.1 mmol/L   Chloride 101 98 - 111 mmol/L   BUN 28 (H) 8 - 23 mg/dL   Creatinine, Ser 1.00 0.61 - 1.24 mg/dL   Glucose, Bld 89 70 - 99 mg/dL    Comment: Glucose reference range applies only to samples taken after fasting for at least 8 hours.   Calcium, Ion 1.16 1.15 - 1.40 mmol/L   TCO2 29 22 - 32 mmol/L   Hemoglobin 14.3 13.0 - 17.0 g/dL   HCT 42.0 39.0 - 52.0 %  MR BRAIN WO CONTRAST  Result Date: 06/17/2022 CLINICAL DATA:  Neuro deficit, acute,  stroke suspected. EXAM: MRI HEAD WITHOUT CONTRAST TECHNIQUE: Multiplanar, multiecho pulse sequences of the brain and surrounding structures were obtained without intravenous contrast. COMPARISON:  Head CT 08/15/2013 FINDINGS: Brain: There is a background pattern of chronic small vessel ischemic change throughout the pons and cerebral hemispheric white matter. There is an acute infarction within the right posterior frontal white matter, probably external capsule merging with corona radiata. This measures about 1 cm in size. No other acute infarction. No mass lesion, hemorrhage, hydrocephalus or extra-axial collection. Scattered punctate foci of hemosiderin deposition present in association with some of the old small vessel infarctions. Vascular: Major vessels at the base of the brain show flow. Skull and upper cervical spine: Negative Sinuses/Orbits: Paranasal sinuses are clear.  Orbits are normal. Other: Mastoid effusion on the right. No posterior nasopharyngeal mass is seen. 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 IMPRESSION: 1. 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 No swelling or hemorrhage. 2. Background pattern of chronic small-vessel ischemic changes throughout the pons and cerebral hemispheric white matter. Electronically Signed   By: Paulina Fusi M.D.   On: 06/17/2022 15:54    Pending Labs Unresulted Labs (From admission, onward)     Start     Ordered   06/24/22 0500  Creatinine, serum  (enoxaparin (LOVENOX)    CrCl >/= 30 ml/min)  Weekly,   R     Comments: while on enoxaparin therapy    06/17/22 1640   06/18/22 0500  Lipid panel  (Labs)  Tomorrow morning,   R       Comments: Fasting    06/17/22 1640   06/18/22 0500  Hemoglobin A1c  (Labs)  Tomorrow morning,   R       Comments: To assess prior glycemic control    06/17/22 1640            Vitals/Pain Today's Vitals    06/17/22 1335 06/17/22 1341 06/17/22 1400  BP: (!) 155/82  (!) 145/101  Pulse: 71  79  Resp: 20  (!) 23  Temp: 97.8 F (36.6 C)    TempSrc: Oral    SpO2: 100%  100%  PainSc:  0-No pain     Isolation Precautions No active isolations  Medications Medications  sodium chloride flush (NS) 0.9 % injection 3 mL (3 mLs Intravenous Not Given 06/17/22 1356)   stroke: early stages of recovery book (has no administration in time range)  acetaminophen (TYLENOL) tablet 650 mg (has no administration in time range)    Or  acetaminophen (TYLENOL) 160 MG/5ML solution 650 mg (has no administration in time range)    Or  acetaminophen (TYLENOL) suppository 650 mg (has no administration in time range)  senna-docusate (Senokot-S) tablet 1 tablet (has no administration in time range)  enoxaparin (LOVENOX) injection 40 mg (has no administration in time range)  aspirin EC tablet 81 mg (has no administration in time range)  clopidogrel (PLAVIX) tablet 75 mg (has no administration in time range)    Mobility walks with person assist     Focused Assessments Neuro Assessment Handoff:  Swallow screen pass? Yes    NIH Stroke Scale  Dizziness Present: No Headache Present: No Interval: Initial Level of Consciousness (1a.)   : Alert, keenly responsive LOC Questions (1b. )   : Answers both questions correctly LOC Commands (1c. )   :  Performs both tasks correctly Best Gaze (2. )  : Normal Visual (3. )  : No visual loss Facial Palsy (4. )    : Normal symmetrical movements Motor Arm, Left (5a. )   : No drift Motor Arm, Right (5b. ) : No drift Motor Leg, Left (6a. )  : No drift Motor Leg, Right (6b. ) : No drift Limb Ataxia (7. ): Present in one limb Sensory (8. )  : Mild-to-moderate sensory loss, patient feels pinprick is less sharp or is dull on the affected side, or there is a loss of superficial pain with pinprick, but patient is aware of being touched Best Language (9. )  : No aphasia Dysarthria  (10. ): Normal Extinction/Inattention (11.)   : No Abnormality Complete NIHSS TOTAL: 2     Neuro Assessment: Exceptions to WDL Neuro Checks:   Initial (06/17/22 1353)  Has TPA been given? No If patient is a Neuro Trauma and patient is going to OR before floor call report to [facility] nurse: [phone] or [phone]   R Recommendations: See Admitting Provider Note  Report given to:   Additional Notes:

## 2022-06-18 ENCOUNTER — Other Ambulatory Visit: Payer: Self-pay | Admitting: Cardiology

## 2022-06-18 ENCOUNTER — Observation Stay (HOSPITAL_BASED_OUTPATIENT_CLINIC_OR_DEPARTMENT_OTHER): Payer: Medicare Other

## 2022-06-18 ENCOUNTER — Observation Stay (HOSPITAL_COMMUNITY): Payer: Medicare Other

## 2022-06-18 ENCOUNTER — Encounter: Payer: Self-pay | Admitting: *Deleted

## 2022-06-18 ENCOUNTER — Other Ambulatory Visit (HOSPITAL_COMMUNITY): Payer: Self-pay

## 2022-06-18 DIAGNOSIS — I10 Essential (primary) hypertension: Secondary | ICD-10-CM | POA: Diagnosis not present

## 2022-06-18 DIAGNOSIS — I4891 Unspecified atrial fibrillation: Secondary | ICD-10-CM

## 2022-06-18 DIAGNOSIS — I452 Bifascicular block: Secondary | ICD-10-CM

## 2022-06-18 DIAGNOSIS — I6389 Other cerebral infarction: Secondary | ICD-10-CM

## 2022-06-18 DIAGNOSIS — I639 Cerebral infarction, unspecified: Secondary | ICD-10-CM | POA: Diagnosis not present

## 2022-06-18 DIAGNOSIS — S12110A Anterior displaced Type II dens fracture, initial encounter for closed fracture: Secondary | ICD-10-CM | POA: Diagnosis not present

## 2022-06-18 DIAGNOSIS — G629 Polyneuropathy, unspecified: Secondary | ICD-10-CM | POA: Diagnosis not present

## 2022-06-18 DIAGNOSIS — Q283 Other malformations of cerebral vessels: Secondary | ICD-10-CM | POA: Diagnosis not present

## 2022-06-18 DIAGNOSIS — I493 Ventricular premature depolarization: Secondary | ICD-10-CM

## 2022-06-18 DIAGNOSIS — I6521 Occlusion and stenosis of right carotid artery: Secondary | ICD-10-CM | POA: Diagnosis not present

## 2022-06-18 DIAGNOSIS — G319 Degenerative disease of nervous system, unspecified: Secondary | ICD-10-CM | POA: Diagnosis not present

## 2022-06-18 LAB — ECHOCARDIOGRAM COMPLETE
AR max vel: 2.33 cm2
AV Area VTI: 1.97 cm2
AV Area mean vel: 2.57 cm2
AV Mean grad: 6 mmHg
AV Peak grad: 12.2 mmHg
Ao pk vel: 1.75 m/s
Area-P 1/2: 3.36 cm2
Calc EF: 57.8 %
P 1/2 time: 1041 msec
S' Lateral: 3.9 cm
Single Plane A2C EF: 51 %
Single Plane A4C EF: 60.2 %

## 2022-06-18 LAB — LIPID PANEL
Cholesterol: 210 mg/dL — ABNORMAL HIGH (ref 0–200)
HDL: 52 mg/dL (ref >40)
LDL Cholesterol: 145 mg/dL — ABNORMAL HIGH (ref 0–99)
Total CHOL/HDL Ratio: 4 RATIO
Triglycerides: 63 mg/dL (ref <150)
VLDL: 13 mg/dL (ref 0–40)

## 2022-06-18 LAB — HEMOGLOBIN A1C
Hgb A1c MFr Bld: 5.8 % — ABNORMAL HIGH (ref 4.8–5.6)
Mean Plasma Glucose: 119.76 mg/dL

## 2022-06-18 MED ORDER — ROSUVASTATIN CALCIUM 20 MG PO TABS
20.0000 mg | ORAL_TABLET | Freq: Every day | ORAL | Status: DC
  Administered 2022-06-18: 20 mg via ORAL
  Filled 2022-06-18: qty 1

## 2022-06-18 MED ORDER — ENOXAPARIN SODIUM 40 MG/0.4ML IJ SOSY: 40.0000 mg | PREFILLED_SYRINGE | INTRAMUSCULAR | Status: DC

## 2022-06-18 MED ORDER — CLOPIDOGREL BISULFATE 75 MG PO TABS
75.0000 mg | ORAL_TABLET | Freq: Every day | ORAL | 0 refills | Status: AC
  Filled 2022-06-18: qty 21, 21d supply, fill #0

## 2022-06-18 MED ORDER — LISINOPRIL 20 MG PO TABS: 20.0000 mg | ORAL_TABLET | Freq: Every day | ORAL | Status: DC

## 2022-06-18 MED ORDER — ROSUVASTATIN CALCIUM 20 MG PO TABS
20.0000 mg | ORAL_TABLET | Freq: Every day | ORAL | 1 refills | Status: DC
  Filled 2022-06-18: qty 30, 30d supply, fill #0

## 2022-06-18 MED ORDER — ASPIRIN 81 MG PO TBEC
81.0000 mg | DELAYED_RELEASE_TABLET | Freq: Every day | ORAL | 12 refills | Status: AC
  Filled 2022-06-18: qty 30, 30d supply, fill #0

## 2022-06-18 MED ORDER — LISINOPRIL-HYDROCHLOROTHIAZIDE 20-12.5 MG PO TABS: 1.0000 | ORAL_TABLET | Freq: Every day | ORAL | Status: AC

## 2022-06-18 MED ORDER — IOHEXOL 350 MG/ML SOLN
75.0000 mL | Freq: Once | INTRAVENOUS | Status: AC | PRN
  Administered 2022-06-18: 75 mL via INTRAVENOUS

## 2022-06-18 NOTE — Progress Notes (Signed)
  Echocardiogram 2D Echocardiogram has been performed.  Lana Fish 06/18/2022, 1:44 PM

## 2022-06-18 NOTE — Progress Notes (Addendum)
STROKE TEAM PROGRESS NOTE   INTERVAL HISTORY Patient is seen in his room with multiple family members at the bedside.  He is hard of hearing, and his hearing aids have run out of batteries.  He presented yesterday with left arm weakness and numbness.  No new issues today.  Vitals:   06/17/22 2307 06/18/22 0304 06/18/22 0800 06/18/22 1157  BP: (!) 137/95 114/83 97/65 123/87  Pulse: 86 88 73 85  Resp: 15 11 13 15   Temp: 98.4 F (36.9 C) 98.3 F (36.8 C) 97.8 F (36.6 C) 98.3 F (36.8 C)  TempSrc: Oral Oral Oral Oral  SpO2: 99% 98% 97% 99%   CBC:  Recent Labs  Lab 06/17/22 1348 06/17/22 1411  WBC 6.7  --   NEUTROABS 4.8  --   HGB 13.8 14.3  HCT 42.2 42.0  MCV 92.1  --   PLT 284  --    Basic Metabolic Panel:  Recent Labs  Lab 06/17/22 1348 06/17/22 1411  NA 138 141  K 3.6 3.6  CL 99 101  CO2 28  --   GLUCOSE 91 89  BUN 22 28*  CREATININE 1.00 1.00  CALCIUM 9.4  --    Lipid Panel:  Recent Labs  Lab 06/18/22 0620  CHOL 210*  TRIG 63  HDL 52  CHOLHDL 4.0  VLDL 13  LDLCALC 145*   HgbA1c:  Recent Labs  Lab 06/18/22 0620  HGBA1C 5.8*   Urine Drug Screen: No results for input(s): "LABOPIA", "COCAINSCRNUR", "LABBENZ", "AMPHETMU", "THCU", "LABBARB" in the last 168 hours.  Alcohol Level  Recent Labs  Lab 06/17/22 1348  ETH <10    IMAGING past 24 hours CT ANGIO HEAD NECK W WO CM  Result Date: 06/18/2022 CLINICAL DATA:  Follow-up examination for stroke. EXAM: CT ANGIOGRAPHY HEAD AND NECK TECHNIQUE: Multidetector CT imaging of the head and neck was performed using the standard protocol during bolus administration of intravenous contrast. Multiplanar CT image reconstructions and MIPs were obtained to evaluate the vascular anatomy. Carotid stenosis measurements (when applicable) are obtained utilizing NASCET criteria, using the distal internal carotid diameter as the denominator. RADIATION DOSE REDUCTION: This exam was performed according to the departmental  dose-optimization program which includes automated exposure control, adjustment of the mA and/or kV according to patient size and/or use of iterative reconstruction technique. CONTRAST:  60mL OMNIPAQUE IOHEXOL 350 MG/ML SOLN COMPARISON:  Prior MRI from 06/17/2022. FINDINGS: CT HEAD FINDINGS Brain: Age-related cerebral atrophy with moderate chronic microvascular ischemic disease. Previously identified small right sided infarct not well visualized by CT. No other acute large vessel territory infarct. No acute intracranial hemorrhage. No mass lesion or midline shift. No hydrocephalus or extra-axial fluid collection. Vascular: No abnormal hyperdense vessel. Scattered vascular calcifications noted within the carotid siphons. Skull: Scalp soft tissues and calvarium demonstrate no acute finding. Sinuses/Orbits: Globes and orbital soft tissues within normal limits. Paranasal sinuses are largely clear. Small right mastoid effusion noted, of doubtful significance. Other: None. Review of the MIP images confirms the above findings CTA NECK FINDINGS Aortic arch: Partially visualized aortic arch somewhat ectatic measuring up to 3.2 cm in diameter. Origin of the great vessels incompletely visualized on this exam. No visible stenosis. Right carotid system: Right common and internal carotid arteries are tortuous without dissection. Minimal atheromatous change about the right carotid bulb without stenosis. Left carotid system: Left common and internal carotid arteries are tortuous without dissection. No hemodynamically significant stenosis about the left carotid artery system. Vertebral arteries: Both vertebral arteries  arise from the subclavian arteries. No visible proximal subclavian artery stenosis. Right vertebral artery strongly dominant, with a diffusely hypoplastic left vertebral artery. Vertebral arteries patent without stenosis or dissection. 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 No  worrisome osseous lesions. Other neck: No other visible soft tissue abnormality within the neck. Upper chest: Emphysema. 3 mm right upper lobe nodule noted (series 7, image 129), indeterminate. Visualized upper chest demonstrates no other acute finding. Review of the MIP images confirms the above findings CTA HEAD FINDINGS Anterior circulation: Both internal carotid arteries are widely patent to the termini without stenosis or other abnormality. A1 segments, anterior communicating artery complex common anterior cerebral arteries widely patent without stenosis. No M1 stenosis or occlusion. No proximal MCA branch occlusion or high-grade stenosis. Distal MCA branches perfused and symmetric. Posterior circulation: Dominant right V4 segment widely patent. Right PICA patent. Hypoplastic left vertebral artery terminates in PICA. Left PICA patent as well. Basilar diminutive but patent without stenosis. 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 Superior cerebral arteries patent bilaterally. Fetal type origin of the PCAs bilaterally, both of which are widely patent to their distal aspects. Venous sinuses: Patent allowing for timing the contrast bolus. Anatomic variants: As above.  No aneurysm. Review of the MIP images confirms the above findings IMPRESSION: CT HEAD IMPRESSION: 1. Previously identified small right-sided infarct not well visualized by CT. No other acute intracranial abnormality. 2. Underlying age-related cerebral atrophy with moderate chronic microvascular ischemic disease. CTA HEAD AND NECK IMPRESSION: 1. Negative CTA for large vessel occlusion or other emergent finding. 2. Mild for age atheromatous disease without hemodynamically significant or correctable stenosis. 3. Diffuse tortuosity of the major arterial vasculature of the head and neck, suggesting chronic underlying hypertension. 4. 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 5. Dilatation of the aortic  arch up to 3.2 cm. Recommend annual imaging followup by CTA or MRA. This recommendation follows 2010 ACCF/AHA/AATS/ACR/ASA/SCA/SCAI/SIR/STS/SVM Guidelines for the Diagnosis and Management of Patients with Thoracic Aortic Disease. Circulation.2010; 121: J628-B151. Aortic aneurysm NOS (ICD10-I71.9). 6. 3 mm right upper lobe nodule, indeterminate. Per Fleischner Society Guidelines, a non-contrast Chest CT at 12 months is optional. If performed and the nodule is stable at 12 months, no further follow-up is recommended. These guidelines do not apply to immunocompromised patients and patients with cancer. Follow up in patients with significant comorbidities as clinically warranted. For lung cancer screening, adhere to Lung-RADS guidelines. Reference: Radiology. 2017; 284(1):228-43. Aortic Atherosclerosis (ICD10-I70.0) and Emphysema (ICD10-J43.9). Electronically Signed   By: Jeannine Boga M.D.   On: 06/18/2022 01:15   MR BRAIN WO CONTRAST  Result Date: 06/17/2022 CLINICAL DATA:  Neuro deficit, acute, stroke suspected. EXAM: MRI HEAD WITHOUT CONTRAST TECHNIQUE: Multiplanar, multiecho pulse sequences of the brain and surrounding structures were obtained without intravenous contrast. COMPARISON:  Head CT 08/15/2013 FINDINGS: Brain: There is a background pattern of chronic small vessel ischemic change throughout the pons and cerebral hemispheric white matter. There is an acute infarction within the right posterior frontal white matter, probably external capsule merging with corona radiata. This measures about 1 cm in size. No other acute infarction. No mass lesion, hemorrhage, hydrocephalus or extra-axial collection. Scattered punctate foci of hemosiderin deposition present in association with some of the old small vessel infarctions. Vascular: Major vessels at the base of the brain show flow. Skull and upper cervical spine: Negative Sinuses/Orbits: Paranasal sinuses are clear.  Orbits are normal. Other: Mastoid  effusion on the right. No  posterior nasopharyngeal mass is seen. 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 IMPRESSION: 1. 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 No swelling or hemorrhage. 2. Background pattern of chronic small-vessel ischemic changes throughout the pons and cerebral hemispheric white matter. Electronically Signed   By: Nelson Chimes M.D.   On: 06/17/2022 15:54    PHYSICAL EXAM General: Alert, well-nourished, well-developed elderly patient in no acute distress Respiratory: Regular, unlabored respirations on room air  NEURO:  Mental Status: AA&Ox3  Speech/Language: speech is without dysarthria or aphasia.    Cranial Nerves:  II: PERRL.  III, IV, VI: EOMI. Eyelids elevate symmetrically.  V: Sensation is intact to light touch and symmetrical to face.  VII: Smile is symmetrical.   VIII: hearing intact to voice. IX, X: Phonation is normal.  FT:DDUKGURK shrug 5/5. XII: tongue is midline without fasciculations. Motor: 5/5 strength right upper extremity, right lower extremity and left lower extremity, 4+ out of 5 strength to left upper extremity Tone: is normal and bulk is normal Sensation- Intact to light touch bilaterally but diminished in the left arm and hand Coordination: FTN intact on the right, in proportion to weakness on the left Gait- deferred    ASSESSMENT/PLAN [name] is a 89 y.o. male with history of hypertension and macular degeneration presenting with acute onset left arm and hand numbness and weakness, as well as left facial droop.   Stroke: Posterior frontal white matter stroke Etiology: Likely small vessel disease  CT head small right-sided infarct not well-visualized on CT CTA head & neck negative for LVO or other emergent finding, dilation of the aortic arch up to 3.2 cm, 3 mm right upper lung nodule MRI 1 cm acute infarct in  posterior frontal white matter with background of chronic small vessel ischemic changes 2D Echo 60-65%. LDL 145 HgbA1c 5.8 VTE prophylaxis -Lovenox    Diet   Diet regular Room service appropriate? Yes; Fluid consistency: Thin   Aspirin 162.5 mg daily  prior to admission, now on aspirin 81 mg daily and clopidogrel 75 mg daily for 3 weeks followed by Plavix alone Therapy recommendations: Pending Disposition: Pending  Hypertension Home meds: Lisinopril-hydrochlorothiazide 20-12 0.5 daily, lisinopril 20 mg every afternoon Stable Permissive hypertension (OK if < 220/120) but gradually normalize in 5-7 days Long-term BP goal normotensive  Hyperlipidemia Home meds: None LDL 145, goal < 70 Add rosuvastatin 20 mg daily Continue statin at discharge   Other Stroke Risk Factors Advanced Age >/= 61   Other Active Problems Right upper lung nodule-recommend chest CT in 12 months  Dilatation of aortic arch-recommend annual follow-up with [facility] day # [place] , MSN, AGACNP-BC [facility] See Amion for schedule and pager information 06/18/2022 3:64 PM    89 year old With right posterior frontal CVA.  With residual left-sided weakness.  If echo negative can be discharged with Zio patch.  DAPT therapy for 3 weeks then Plavix alone.  Plan discussed with Dr. Sloan Leiter.  He will call neurology if the echo is abnormal.  Follow-up in stroke clinic [facility] in 8 weeks after discharge  ADDENDUM: Echo is neg.  Neurology will sign off.    To contact Stroke Continuity provider, please refer to [email or web address] After hours, contact General Neurology

## 2022-06-18 NOTE — Evaluation (Signed)
Speech Language Pathology Evaluation Patient Details Name: [name] MRN: [id number] DOB: 06-09-1932 Today's Date: 06/18/2022 Time: 9381-8299 SLP Time Calculation (min) (ACUTE ONLY): 30 min  Problem List:  Patient Active Problem List   Diagnosis Date Noted   Neuropathy 06/17/2022   Acute CVA (cerebrovascular accident) ([place]) 06/17/2022   HTN (hypertension) 06/17/2022   Closed fracture of second cervical vertebra ([place]) 08/09/2013   Past Medical History:  Past Medical History:  Diagnosis Date   Hypertension    Macular degeneration    Past Surgical History:  Past Surgical History:  Procedure Laterality Date   CATARACT EXTRACTION     EYE SURGERY     ROTATOR CUFF REPAIR Bilateral    HPI:  Pt is an 89 y/o M admitted on 06/17/22 after presenting with c/o LUE numbness, slurred speech, facial droop, & L eye blurriness. MRI brain showed 1 cm acute infarct in the right frontal lobe posteriorly. PMH: macular degeneration, cataracts, R ankle wound, HTN, neuropathy, cervical spine fx   Assessment / Plan / Recommendation Clinical Impression  Patient is presenting as WNL for cognition, language and speech function. He exhibits a very mild (only noticeable if looking for it) left side labial weakness and droop but this does not impact his speech intelligibility. He did report that when stroke symptoms first presented, he was having speech slurring. His main complaint is that he still has numbness and reduced strength in left UE. SLP not recommending further assessment or intervention at this time as he is at independent level for cognitive-linguistic and speech function.    SLP Assessment  SLP Recommendation/Assessment: Patient does not need any further Speech Halfway Pathology Services SLP Visit Diagnosis: Cognitive communication deficit (R41.841)    Recommendations for follow up therapy are one component of a multi-disciplinary discharge planning process, led by the attending physician.   Recommendations may be updated based on patient status, additional functional criteria and insurance authorization.    Follow Up Recommendations  No SLP follow up    Assistance Recommended at Discharge  None  Functional Status Assessment Patient has not had a recent decline in their functional status  Frequency and Duration   N/A        SLP Evaluation Cognition  Overall Cognitive Status: Within Functional Limits for tasks assessed Arousal/Alertness: Awake/alert Orientation Level: Oriented X4       Comprehension  Auditory Comprehension Overall Auditory Comprehension: Appears within functional limits for tasks assessed    Expression Expression Primary Mode of Expression: Verbal Verbal Expression Overall Verbal Expression: Appears within functional limits for tasks assessed   Oral / Motor  Oral Motor/Sensory Function Overall Oral Motor/Sensory Function: Mild impairment Facial ROM: Within Functional Limits Facial Symmetry: Abnormal symmetry left Facial Strength: Within Functional Limits Facial Sensation: Reduced left Lingual ROM: Within Functional Limits Lingual Symmetry: Within Functional Limits Lingual Strength: Within Functional Limits Lingual Sensation: Within Functional Limits Velum: Within Functional Limits Mandible: Within Functional Limits Motor Speech Overall Motor Speech: Appears within functional limits for tasks assessed Articulation: Within functional limitis Intelligibility: Intelligible Motor Planning: Witnin functional limits Motor Speech Errors: Not applicable            Sonia Baller, MA, CCC-SLP Speech Therapy

## 2022-06-18 NOTE — Progress Notes (Signed)
Neurology requested cardiac monitor for evaluation of CVA. Monitor ordered, will be read by Dr. Harriet Masson (DOD) as patient is not an established patient with our practice.   Margie Billet, PA-C 06/18/2022 3:21 PM

## 2022-06-18 NOTE — Evaluation (Signed)
Physical Therapy Evaluation Patient Details Name: [name] MRN: [id number] DOB: 07-24-1932 Today's Date: 06/18/2022  History of Present Illness  Pt is an 89 y/o M admitted on 06/17/22 after presenting with c/o LUE numbness, slurred speech, facial droop, & L eye blurriness. MRI brain showed 1 cm acute infarct in the right frontal lobe posteriorly. PMH: macular degeneration, cataracts, R ankle wound, HTN, neuropathy, cervical spine fx  Clinical Impression  Pt seen for PT evaluation with pt agreeable to tx. Pt very HOH (hearing aide batteries dead) so PLOF/home set up information obtained by speaking with pt's wife via telephone. Prior to admission pt was very active, working, driving. On this date, pt demonstrates LUE weakness & inability to place hearing aide in L ear with LUE. Pt is able to complete bed mobility with mod I, STS with CGA & ambulate in hallway without AD & CGA fade to supervision. Pt's wife is hopeful pt can d/c home as soon as possible. Will continue to follow pt acutely to address balance, gait & stair negotiation.      Recommendations for follow up therapy are one component of a multi-disciplinary discharge planning process, led by the attending physician.  Recommendations may be updated based on patient status, additional functional criteria and insurance authorization.  Follow Up Recommendations No PT follow up      Assistance Recommended at Discharge Set up Supervision/Assistance  Patient can return home with the following  Assistance with cooking/housework;Assist for transportation;Help with stairs or ramp for entrance    Equipment Recommendations None recommended by PT  Recommendations for Other Services       Functional Status Assessment Patient has had a recent decline in their functional status and demonstrates the ability to make significant improvements in function in a reasonable and predictable amount of time.     Precautions / Restrictions  Precautions Precautions: Fall Restrictions Weight Bearing Restrictions: No      Mobility  Bed Mobility Overal bed mobility: Modified Independent Bed Mobility: Supine to Sit     Supine to sit: Modified independent (Device/Increase time), HOB elevated          Transfers Overall transfer level: Needs assistance Equipment used: None Transfers: Sit to/from Stand Sit to Stand: Min guard                Ambulation/Gait Ambulation/Gait assistance: Supervision, Min guard (CGA fade to supervision) Gait Distance (Feet): 150 Feet Assistive device: None Gait Pattern/deviations: Decreased stride length, Step-to pattern Gait velocity: decreased        Stairs            Wheelchair Mobility    Modified Rankin (Stroke Patients Only)       Balance Overall balance assessment: Needs assistance Sitting-balance support: Feet supported Sitting balance-Leahy Scale: Good     Standing balance support: During functional activity, No upper extremity supported Standing balance-Leahy Scale: Fair                               Pertinent Vitals/Pain Pain Assessment Pain Assessment: Faces Faces Pain Scale: No hurt    Home Living Family/patient expects to be discharged to:: Private residence Living Arrangements: Spouse/significant other Available Help at Discharge: Family Type of Home: House Home Access: Stairs to enter Entrance Stairs-Rails: None Entrance Stairs-Number of Steps: 1 at front   Home Layout: Two level;Able to live on main level with bedroom/bathroom Home Equipment: Grab bars - toilet;Rolling Walker (2 wheels)  Prior Function Prior Level of Function : Independent/Modified Independent;Working/employed;Driving             Mobility Comments: Pt very active (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       Hand Dominance   Dominant Hand: Right    Extremity/Trunk  Assessment   Upper Extremity Assessment Upper Extremity Assessment: LUE deficits/detail;Defer to OT evaluation LUE Deficits / Details: difficult to assess 2/2 HOH/communication deficits    Lower Extremity Assessment Lower Extremity Assessment: Generalized weakness    Cervical / Trunk Assessment Cervical / Trunk Assessment: Kyphotic  Communication   Communication: HOH (pt has hearing aides but they are not charged; utilized written communication throughout session.)  Cognition Arousal/Alertness: Awake/alert Behavior During Therapy: WFL for tasks assessed/performed Overall Cognitive Status: Within Functional Limits for tasks assessed                                          General Comments General comments (skin integrity, edema, etc.): BP in RUE at end of session: 117/88 mmHg MAP 98, max HR 121 bpm    Exercises     Assessment/Plan    PT Assessment Patient needs continued PT services  PT Problem List Decreased strength;Decreased activity tolerance;Decreased balance;Decreased mobility;Decreased safety awareness;Decreased knowledge of use of DME       PT Treatment Interventions DME instruction;Therapeutic exercise;Gait training;Balance training;Stair training;Neuromuscular re-education;Functional mobility training;Therapeutic activities;Patient/family education    PT Goals (Current goals can be found in the Care Plan section)  Acute Rehab PT Goals Patient Stated Goal: return home as soon as possible PT Goal Formulation: With family Time For Goal Achievement: 07/02/22 Potential to Achieve Goals: Good    Frequency Min 3X/week     Co-evaluation               AM-PAC PT "6 Clicks" Mobility  Outcome Measure Help needed turning from your back to your side while in a flat bed without using bedrails?: None Help needed moving from lying on your back to sitting on the side of a flat bed without using bedrails?: A Little Help needed moving to and from a bed  to a chair (including a wheelchair)?: A Little Help needed standing up from a chair using your arms (e.g., wheelchair or bedside chair)?: A Little Help needed to walk in hospital room?: A Little Help needed climbing 3-5 steps with a railing? : A Little 6 Click Score: 19    End of Session   Activity Tolerance: Patient tolerated treatment well Patient left: in chair;with chair alarm set;with call bell/phone within reach Nurse Communication: Mobility status PT Visit Diagnosis: Unsteadiness on feet (R26.81);Muscle weakness (generalized) (M62.81)    Time: 7494-4967 PT Time Calculation (min) (ACUTE ONLY): 21 min   Charges:   PT Evaluation $PT Eval Moderate Complexity: [place], PT, DPT 06/18/22, 9:23 AM   Waunita Schooner 06/18/2022, 9:22 AM

## 2022-06-18 NOTE — Discharge Summary (Signed)
PATIENT DETAILS Name: [name] Age: 89 y.o. Sex: male Date of Birth: February 11, 1933 MRN: [id number]. Admitting Physician: Marcelyn Bruins, MD [id number], Arbie Cookey, MD  Admit Date: 06/17/2022 Discharge date: 06/18/2022  Recommendations for Outpatient Follow-up:  Follow up with PCP in 1-2 weeks Please obtain CMP/CBC in one week Please obtain follow-up with the stroke clinic Incidental finding-aneurysmal dilatation of the aortic arch-repeat CTA/MRA in 1 year-see below Incidental finding 3 mm right upper lobe nodule-repeat imaging in 1 year.  Admitted From:  Home  Disposition: Home   Discharge Condition: good  CODE STATUS:   Code Status: Full Code   Diet recommendation:  Diet Order             Diet regular Room service appropriate? Yes; Fluid consistency: Thin  Diet effective now           Diet - low sodium heart healthy                    Brief Summary: Patient is a 89 y.o.  male with history of HTN,, neuropathy hearing impairment, macular degeneration-who presented with left arm/hand numbness-upon further evaluation patient was found to have a right posterior frontal white matter infarction on MRI brain.   Significant events: 2/6>> admit to [facility]-strokelike symptoms   Significant studies: 2/6>> MRI brain: 1 cm acute infarct in the right posterior frontal white matter 2/6>> CT angio head/neck: No LVO-no hemodynamically significant stenosis. 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  Defer further to outpatient setting.   BMI: Estimated body mass index is 22.85 kg/m as calculated from the following:   Height as of 08/15/13: 5\' 3"  (1.6 m).   Weight as of 08/15/13: 58.5 kg.    Discharge Diagnoses:  Principal Problem:   Acute CVA (cerebrovascular accident) [facility]) Active Problems:   Neuropathy   HTN (hypertension)   Discharge Instructions:  Activity:  As tolerated   Discharge Instructions     Ambulatory referral to Neurology   Complete by: As directed    An appointment is requested in approximately: 4 weeks   Call MD for:  extreme fatigue   Complete by: As directed    Diet - low sodium heart healthy   Complete by: As directed    Discharge instructions   Complete by: As directed    Follow with Primary MD  Maurice Small, MD in 1-2 weeks  Incidental finding-3 mm right upper lobe nodule-repeat  CT chest in 12 months to ensure stability.  In some cases these nodules turn cancerous.  Incidental finding-3.2 cm  dilation of ascending aorta-please ask your primary care practitioner to repeat CT/MRI imaging in 12 months.  Take aspirin/Plavix for 3 weeks, after 3 weeks stop Plavix and continue aspirin.  Please get a complete blood count and chemistry panel checked by your Primary MD at your next visit, and again as instructed by your Primary MD.  Get Medicines reviewed and adjusted: Please take all your medications with you for your next visit with your  Primary MD  Laboratory/radiological data: Please request your Primary MD to go over all hospital tests and procedure/radiological results at the follow up, please ask your Primary MD to get all Hospital records sent to his/her office.  In some cases, they will be blood work, cultures and biopsy results pending at the time of your discharge. Please request that your primary care M.D. follows up on these results.  Also Note the following: If you experience worsening of your admission symptoms, develop shortness of breath, life threatening emergency, suicidal or homicidal thoughts you must seek medical attention immediately by calling 911 or calling your MD immediately  if symptoms less severe.  You must read complete instructions/literature along with all the possible adverse reactions/side effects for all the Medicines you take and that have been prescribed to you. Take any new Medicines after you have completely understood and accpet all the possible adverse reactions/side effects.   Do not drive when taking Pain medications or sleeping medications (Benzodaizepines)  Do not take more than prescribed Pain, Sleep and Anxiety Medications. It is not advisable to combine anxiety,sleep and pain medications without talking with your primary care practitioner  Special Instructions: If you have smoked or chewed Tobacco  in the last 2 yrs please stop smoking, stop any regular Alcohol  and or any Recreational drug use.  Wear Seat belts while driving.  Please note: You were cared for by a hospitalist during your hospital stay. Once you are discharged, your primary care physician will handle any further medical issues. Please note that NO REFILLS for any discharge medications will be authorized once you are discharged, as it is imperative that you return to your primary care physician (or establish a relationship with a primary care physician if you do not have one) for your post hospital discharge needs so  that they can reassess your need for medications and monitor your lab values.   Increase activity slowly   Complete by: As directed       Allergies as of 06/18/2022       Reactions   Prednisone Other (See Comments)   Unknown         Medication List     STOP taking these medications    aspirin 325 MG tablet Replaced by: aspirin EC 81 MG tablet       TAKE these medications    aspirin EC 81 MG tablet Take 1 tablet (81 mg total) by mouth daily. Swallow whole. Start taking on: June 19, 2022 Replaces: aspirin 325 MG tablet   b complex vitamins capsule Take 1 capsule by mouth daily in the afternoon.   CALCIUM 600 + D PO Take 1 tablet by mouth 3 (three) times a week.   clopidogrel 75 MG tablet Commonly known as: PLAVIX Take 1 tablet (75 mg total) by mouth daily for 21 days. *Take along with aspirin for 21 days-after 21 days stop Plavix and continue on aspirin.* Start taking on: June 19, 2022   Fish Oil 1000 MG Caps Take 1,000 mg by mouth daily.  gabapentin 300 MG capsule Commonly known as: NEURONTIN Take 300 mg by mouth at bedtime.   GLUCOSAMINE PO Take 1 tablet by mouth in the morning and at bedtime.   lisinopril 20 MG tablet Commonly known as: ZESTRIL Take 1 tablet (20 mg total) by mouth daily in the afternoon. Start taking on: June 19, 2022   lisinopril-hydrochlorothiazide 20-12.5 MG tablet Commonly known as: ZESTORETIC Take 1 tablet by mouth daily. Start taking on: June 19, 2022   multivitamin with minerals Tabs tablet Take 1 tablet by mouth daily.   POTASSIMIN PO Take 1 tablet by mouth in the morning and at bedtime.   PRESERVISION AREDS PO Take 1 tablet by mouth daily in the afternoon.   rosuvastatin 20 MG tablet Commonly known as: CRESTOR Take 1 tablet (20 mg total) by mouth daily. Start taking on: June 19, 2022   VITAMIN C PO Take 1 tablet by mouth every Monday, Wednesday, and Friday. Takes along with vit e   vitamin E 180  MG (400 UNITS) capsule Take 400 Units by mouth every Monday, Wednesday, and Friday.        Follow-up Information     Maurice Small, MD. Schedule an appointment as soon as possible for a visit in 1 week(s).   Specialty: Family Medicine Contact information: [address] [address] [phone]         Garvin Fila, MD Follow up.   Specialties: Neurology, Radiology Why: Hospital follow up, Office will call with date/time, If you dont hear from them,please give them a call Contact information: [address] [place] [address] [phone]                Allergies  Allergen Reactions   Prednisone Other (See Comments)    Unknown      Other Procedures/Studies: ECHOCARDIOGRAM COMPLETE  Result Date: 06/18/2022    ECHOCARDIOGRAM REPORT   Patient Name:   FRANKI GENS [facility] Date of Exam: 06/18/2022 Medical Rec #:  [id number]    Height:       63.0 in Accession #:    [id number]   Weight:       129.0 lb Date of Birth:  1932/12/04   BSA:          1.605 m Patient Age:    44 years     BP:           114/83 mmHg Patient Gender: M            HR:           97 bpm. Exam Location:  Inpatient Procedure: 2D Echo Indications:    stroke  History:        Patient has no prior history of Echocardiogram examinations.                 Stroke; Risk Factors:Hypertension.  Sonographer:    Harvie Junior Referring Phys: [id number] Candace Gallus MELVIN  Sonographer Comments: Technically difficult study due to poor echo windows. IMPRESSIONS  1. Left ventricular ejection fraction, by estimation, is 60 to 65%. The left ventricle has normal function. The left ventricle has no regional wall motion abnormalities. There is severe asymmetric left ventricular hypertrophy of the basal-septal segment. Left ventricular diastolic parameters were normal.  2. Right ventricular systolic function is normal. The right ventricular size is mildly enlarged. There is normal pulmonary artery systolic  pressure. The estimated right ventricular systolic pressure is 0000000 mmHg.  3. The mitral valve  is normal in structure. No evidence of mitral valve regurgitation. No evidence of mitral stenosis.  4. The aortic valve was not well visualized. Aortic valve regurgitation is mild. Aortic valve sclerosis/calcification is present, without any evidence of aortic stenosis.  5. The inferior vena cava is normal in size with greater than 50% respiratory variability, suggesting right atrial pressure of 3 mmHg. FINDINGS  Left Ventricle: Left ventricular ejection fraction, by estimation, is 60 to 65%. The left ventricle has normal function. The left ventricle has no regional wall motion abnormalities. The left ventricular internal cavity size was normal in size. There is  severe asymmetric left ventricular hypertrophy of the basal-septal segment. Left ventricular diastolic parameters were normal. Right Ventricle: The right ventricular size is mildly enlarged. Right vetricular wall thickness was not well visualized. Right ventricular systolic function is normal. There is normal pulmonary artery systolic pressure. The tricuspid regurgitant velocity  is 1.84 m/s, and with an assumed right atrial pressure of 3 mmHg, the estimated right ventricular systolic pressure is 16.5 mmHg. Left Atrium: Left atrial size was normal in size. Right Atrium: Right atrial size was normal in size. Pericardium: There is no evidence of pericardial effusion. Mitral Valve: The mitral valve is normal in structure. No evidence of mitral valve regurgitation. No evidence of mitral valve stenosis. Tricuspid Valve: The tricuspid valve is normal in structure. Tricuspid valve regurgitation is trivial. Aortic Valve: The aortic valve was not well visualized. Aortic valve regurgitation is mild. Aortic regurgitation PHT measures 1041 msec. Aortic valve sclerosis/calcification is present, without any evidence of aortic stenosis. Aortic valve mean gradient measures 6.0  mmHg. Aortic valve peak gradient measures 12.2 mmHg. Aortic valve area, by VTI measures 1.97 cm. Pulmonic Valve: The pulmonic valve was not well visualized. Pulmonic valve regurgitation is not visualized. Aorta: The aortic root is normal in size and structure. Venous: The inferior vena cava is normal in size with greater than 50% respiratory variability, suggesting right atrial pressure of 3 mmHg. IAS/Shunts: The interatrial septum was not well visualized.  LEFT VENTRICLE PLAX 2D LVIDd:         4.60 cm     Diastology LVIDs:         3.90 cm     LV e' medial:    13.70 cm/s LV PW:         1.15 cm     LV E/e' medial:  3.0 LV IVS:        1.15 cm     LV e' lateral:   8.81 cm/s LVOT diam:     2.50 cm     LV E/e' lateral: 4.7 LV SV:         52 LV SV Index:   32 LVOT Area:     4.91 cm                             3D Volume EF: LV Volumes (MOD)           3D EF:        72 % LV vol d, MOD A2C: 58.6 ml LV EDV:       119 ml LV vol d, MOD A4C: 86.9 ml LV ESV:       34 ml LV vol s, MOD A2C: 28.7 ml LV SV:        86 ml LV vol s, MOD A4C: 34.6 ml LV SV MOD A2C:     29.9 ml LV  SV MOD A4C:     86.9 ml LV SV MOD BP:      43.6 ml RIGHT VENTRICLE RV Basal diam:  4.40 cm RV Mid diam:    3.50 cm RV S prime:     17.00 cm/s TAPSE (M-mode): 2.4 cm LEFT ATRIUM             Index        RIGHT ATRIUM           Index LA diam:        2.50 cm 1.56 cm/m   RA Area:     17.10 cm LA Vol (A2C):   23.2 ml 14.46 ml/m  RA Volume:   44.20 ml  27.54 ml/m LA Vol (A4C):   36.2 ml 22.56 ml/m LA Biplane Vol: 31.7 ml 19.75 ml/m  AORTIC VALVE                     PULMONIC VALVE AV Area (Vmax):    2.33 cm      PV Vmax:       0.98 m/s AV Area (Vmean):   2.57 cm      PV Peak grad:  3.9 mmHg AV Area (VTI):     1.97 cm AV Vmax:           174.67 cm/s AV Vmean:          105.600 cm/s AV VTI:            0.264 m AV Peak Grad:      12.2 mmHg AV Mean Grad:      6.0 mmHg LVOT Vmax:         83.00 cm/s LVOT Vmean:        55.200 cm/s LVOT VTI:          0.106 m LVOT/AV VTI  ratio: 0.40 AI PHT:            1041 msec  AORTA Ao Root diam: 3.70 cm MITRAL VALVE               TRICUSPID VALVE MV Area (PHT): 3.36 cm    TR Peak grad:   13.5 mmHg MV Decel Time: 226 msec    TR Vmax:        184.00 cm/s MV E velocity: 41.10 cm/s MV A velocity: 90.10 cm/s  SHUNTS MV E/A ratio:  0.46        Systemic VTI:  0.11 m                            Systemic Diam: 2.50 cm Oswaldo Milian MD Electronically signed by Oswaldo Milian MD Signature Date/Time: 06/18/2022/4:09:45 PM    Final    CT ANGIO HEAD NECK W WO CM  Result Date: 06/18/2022 CLINICAL DATA:  Follow-up examination for stroke. EXAM: CT ANGIOGRAPHY HEAD AND NECK TECHNIQUE: Multidetector CT imaging of the head and neck was performed using the standard protocol during bolus administration of intravenous contrast. Multiplanar CT image reconstructions and MIPs were obtained to evaluate the vascular anatomy. Carotid stenosis measurements (when applicable) are obtained utilizing NASCET criteria, using the distal internal carotid diameter as the denominator. RADIATION DOSE REDUCTION: This exam was performed according to the departmental dose-optimization program which includes automated exposure control, adjustment of the mA and/or kV according to patient size and/or use of iterative reconstruction technique. CONTRAST:  82mL OMNIPAQUE IOHEXOL 350 MG/ML SOLN COMPARISON:  Prior MRI from 06/17/2022. FINDINGS:  CT HEAD FINDINGS Brain: Age-related cerebral atrophy with moderate chronic microvascular ischemic disease. Previously identified small right sided infarct not well visualized by CT. No other acute large vessel territory infarct. No acute intracranial hemorrhage. No mass lesion or midline shift. No hydrocephalus or extra-axial fluid collection. Vascular: No abnormal hyperdense vessel. Scattered vascular calcifications noted within the carotid siphons. Skull: Scalp soft tissues and calvarium demonstrate no acute finding. Sinuses/Orbits: Globes and  orbital soft tissues within normal limits. Paranasal sinuses are largely clear. Small right mastoid effusion noted, of doubtful significance. Other: None. Review of the MIP images confirms the above findings CTA NECK FINDINGS Aortic arch: Partially visualized aortic arch somewhat ectatic measuring up to 3.2 cm in diameter. Origin of the great vessels incompletely visualized on this exam. No visible stenosis. Right carotid system: Right common and internal carotid arteries are tortuous without dissection. Minimal atheromatous change about the right carotid bulb without stenosis. Left carotid system: Left common and internal carotid arteries are tortuous without dissection. No hemodynamically significant stenosis about the left carotid artery system. Vertebral arteries: Both vertebral arteries arise from the subclavian arteries. No visible proximal subclavian artery stenosis. Right vertebral artery strongly dominant, with a diffusely hypoplastic left vertebral artery. Vertebral arteries patent without stenosis or dissection. 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 No worrisome osseous lesions. Other neck: No other visible soft tissue abnormality within the neck. Upper chest: Emphysema. 3 mm right upper lobe nodule noted (series 7, image 129), indeterminate. Visualized upper chest demonstrates no other acute finding. Review of the MIP images confirms the above findings CTA HEAD FINDINGS Anterior circulation: Both internal carotid arteries are widely patent to the termini without stenosis or other abnormality. A1 segments, anterior communicating artery complex common anterior cerebral arteries widely patent without stenosis. No M1 stenosis or occlusion. No proximal MCA branch occlusion or high-grade stenosis. Distal MCA branches perfused and symmetric. Posterior circulation: Dominant right V4 segment widely patent. Right PICA patent. Hypoplastic left vertebral artery terminates in  PICA. Left PICA patent as well. Basilar diminutive but patent without stenosis. 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 Superior cerebral arteries patent bilaterally. Fetal type origin of the PCAs bilaterally, both of which are widely patent to their distal aspects. Venous sinuses: Patent allowing for timing the contrast bolus. Anatomic variants: As above.  No aneurysm. Review of the MIP images confirms the above findings IMPRESSION: CT HEAD IMPRESSION: 1. Previously identified small right-sided infarct not well visualized by CT. No other acute intracranial abnormality. 2. Underlying age-related cerebral atrophy with moderate chronic microvascular ischemic disease. CTA HEAD AND NECK IMPRESSION: 1. Negative CTA for large vessel occlusion or other emergent finding. 2. Mild for age atheromatous disease without hemodynamically significant or correctable stenosis. 3. Diffuse tortuosity of the major arterial vasculature of the head and neck, suggesting chronic underlying hypertension. 4. 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 5. Dilatation of the aortic arch up to 3.2 cm. Recommend annual imaging followup by CTA or MRA. This recommendation follows 2010 ACCF/AHA/AATS/ACR/ASA/SCA/SCAI/SIR/STS/SVM Guidelines for the Diagnosis and Management of Patients with Thoracic Aortic Disease. Circulation.2010; 121: V564-P329. Aortic aneurysm NOS (ICD10-I71.9). 6. 3 mm right upper lobe nodule, indeterminate. Per Fleischner Society Guidelines, a non-contrast Chest CT at 12 months is optional. If performed and the nodule is stable at 12 months, no further follow-up is recommended. These guidelines do not apply to immunocompromised patients and patients with cancer. Follow up in patients with significant comorbidities as clinically warranted. For  lung cancer screening, adhere to Lung-RADS guidelines. Reference: Radiology. 2017; 284(1):228-43. Aortic Atherosclerosis (ICD10-I70.0) and  Emphysema (ICD10-J43.9). Electronically Signed   By: Jeannine Boga M.D.   On: 06/18/2022 01:15   MR BRAIN WO CONTRAST  Result Date: 06/17/2022 CLINICAL DATA:  Neuro deficit, acute, stroke suspected. EXAM: MRI HEAD WITHOUT CONTRAST TECHNIQUE: Multiplanar, multiecho pulse sequences of the brain and surrounding structures were obtained without intravenous contrast. COMPARISON:  Head CT 08/15/2013 FINDINGS: Brain: There is a background pattern of chronic small vessel ischemic change throughout the pons and cerebral hemispheric white matter. There is an acute infarction within the right posterior frontal white matter, probably external capsule merging with corona radiata. This measures about 1 cm in size. No other acute infarction. No mass lesion, hemorrhage, hydrocephalus or extra-axial collection. Scattered punctate foci of hemosiderin deposition present in association with some of the old small vessel infarctions. Vascular: Major vessels at the base of the brain show flow. Skull and upper cervical spine: Negative Sinuses/Orbits: Paranasal sinuses are clear.  Orbits are normal. Other: Mastoid effusion on the right. No posterior nasopharyngeal mass is seen. 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 IMPRESSION: 1. 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 No swelling or hemorrhage. 2. Background pattern of chronic small-vessel ischemic changes throughout the pons and cerebral hemispheric white matter. Electronically Signed   By: Nelson Chimes M.D.   On: 06/17/2022 15:54     TODAY-DAY OF DISCHARGE:  Subjective:   Wille Glaser today has no headache,no chest abdominal pain,no new weakness tingling or numbness, feels much better wants to go home today.   Objective:   Blood pressure 123/87, pulse 85, temperature 98.3 F (36.8 C), temperature source Oral, resp. rate 15, SpO2 99 %.  Intake/Output  Summary (Last 24 hours) at 06/18/2022 1614 Last data filed at 06/18/2022 1300 Gross per 24 hour  Intake 120 ml  Output 1450 ml  Net -1330 ml   There were no vitals filed for this visit.  Exam: Awake Alert, Oriented *3, No new F.N deficits, Normal affect [place].AT,PERRAL Supple Neck,No JVD, No cervical lymphadenopathy appriciated.  Symmetrical Chest wall movement, Good air movement bilaterally, CTAB RRR,No Gallops,Rubs or new Murmurs, No Parasternal Heave +ve B.Sounds, Abd Soft, Non tender, No organomegaly appriciated, No rebound -guarding or rigidity. No Cyanosis, Clubbing or edema, No new Rash or bruise   PERTINENT RADIOLOGIC STUDIES: ECHOCARDIOGRAM COMPLETE  Result Date: 06/18/2022    ECHOCARDIOGRAM REPORT   Patient Name:   ARVID DEGROOT [facility] Date of Exam: 06/18/2022 Medical Rec #:  [id number]    Height:       63.0 in Accession #:    [id number]   Weight:       129.0 lb Date of Birth:  1933-04-13   BSA:          1.605 m Patient Age:    57 years     BP:           114/83 mmHg Patient Gender: M            HR:           97 bpm. Exam Location:  Inpatient Procedure: 2D Echo Indications:    stroke  History:        Patient has no prior history of Echocardiogram examinations.                 Stroke; Risk Factors:Hypertension.  Sonographer:  Harvie Junior Referring Phys: [id number] Candace Gallus MELVIN  Sonographer Comments: Technically difficult study due to poor echo windows. IMPRESSIONS  1. Left ventricular ejection fraction, by estimation, is 60 to 65%. The left ventricle has normal function. The left ventricle has no regional wall motion abnormalities. There is severe asymmetric left ventricular hypertrophy of the basal-septal segment. Left ventricular diastolic parameters were normal.  2. Right ventricular systolic function is normal. The right ventricular size is mildly enlarged. There is normal pulmonary artery systolic pressure. The estimated right ventricular systolic pressure is 45.4 mmHg.  3. The mitral valve is  normal in structure. No evidence of mitral valve regurgitation. No evidence of mitral stenosis.  4. The aortic valve was not well visualized. Aortic valve regurgitation is mild. Aortic valve sclerosis/calcification is present, without any evidence of aortic stenosis.  5. The inferior vena cava is normal in size with greater than 50% respiratory variability, suggesting right atrial pressure of 3 mmHg. FINDINGS  Left Ventricle: Left ventricular ejection fraction, by estimation, is 60 to 65%. The left ventricle has normal function. The left ventricle has no regional wall motion abnormalities. The left ventricular internal cavity size was normal in size. There is  severe asymmetric left ventricular hypertrophy of the basal-septal segment. Left ventricular diastolic parameters were normal. Right Ventricle: The right ventricular size is mildly enlarged. Right vetricular wall thickness was not well visualized. Right ventricular systolic function is normal. There is normal pulmonary artery systolic pressure. The tricuspid regurgitant velocity  is 1.84 m/s, and with an assumed right atrial pressure of 3 mmHg, the estimated right ventricular systolic pressure is 09.8 mmHg. Left Atrium: Left atrial size was normal in size. Right Atrium: Right atrial size was normal in size. Pericardium: There is no evidence of pericardial effusion. Mitral Valve: The mitral valve is normal in structure. No evidence of mitral valve regurgitation. No evidence of mitral valve stenosis. Tricuspid Valve: The tricuspid valve is normal in structure. Tricuspid valve regurgitation is trivial. Aortic Valve: The aortic valve was not well visualized. Aortic valve regurgitation is mild. Aortic regurgitation PHT measures 1041 msec. Aortic valve sclerosis/calcification is present, without any evidence of aortic stenosis. Aortic valve mean gradient measures 6.0 mmHg. Aortic valve peak gradient measures 12.2 mmHg. Aortic valve area, by VTI measures 1.97 cm.  Pulmonic Valve: The pulmonic valve was not well visualized. Pulmonic valve regurgitation is not visualized. Aorta: The aortic root is normal in size and structure. Venous: The inferior vena cava is normal in size with greater than 50% respiratory variability, suggesting right atrial pressure of 3 mmHg. IAS/Shunts: The interatrial septum was not well visualized.  LEFT VENTRICLE PLAX 2D LVIDd:         4.60 cm     Diastology LVIDs:         3.90 cm     LV e' medial:    13.70 cm/s LV PW:         1.15 cm     LV E/e' medial:  3.0 LV IVS:        1.15 cm     LV e' lateral:   8.81 cm/s LVOT diam:     2.50 cm     LV E/e' lateral: 4.7 LV SV:         52 LV SV Index:   32 LVOT Area:     4.91 cm  3D Volume EF: LV Volumes (MOD)           3D EF:        72 % LV vol d, MOD A2C: 58.6 ml LV EDV:       119 ml LV vol d, MOD A4C: 86.9 ml LV ESV:       34 ml LV vol s, MOD A2C: 28.7 ml LV SV:        86 ml LV vol s, MOD A4C: 34.6 ml LV SV MOD A2C:     29.9 ml LV SV MOD A4C:     86.9 ml LV SV MOD BP:      43.6 ml RIGHT VENTRICLE RV Basal diam:  4.40 cm RV Mid diam:    3.50 cm RV S prime:     17.00 cm/s TAPSE (M-mode): 2.4 cm LEFT ATRIUM             Index        RIGHT ATRIUM           Index LA diam:        2.50 cm 1.56 cm/m   RA Area:     17.10 cm LA Vol (A2C):   23.2 ml 14.46 ml/m  RA Volume:   44.20 ml  27.54 ml/m LA Vol (A4C):   36.2 ml 22.56 ml/m LA Biplane Vol: 31.7 ml 19.75 ml/m  AORTIC VALVE                     PULMONIC VALVE AV Area (Vmax):    2.33 cm      PV Vmax:       0.98 m/s AV Area (Vmean):   2.57 cm      PV Peak grad:  3.9 mmHg AV Area (VTI):     1.97 cm AV Vmax:           174.67 cm/s AV Vmean:          105.600 cm/s AV VTI:            0.264 m AV Peak Grad:      12.2 mmHg AV Mean Grad:      6.0 mmHg LVOT Vmax:         83.00 cm/s LVOT Vmean:        55.200 cm/s LVOT VTI:          0.106 m LVOT/AV VTI ratio: 0.40 AI PHT:            1041 msec  AORTA Ao Root diam: 3.70 cm MITRAL VALVE                TRICUSPID VALVE MV Area (PHT): 3.36 cm    TR Peak grad:   13.5 mmHg MV Decel Time: 226 msec    TR Vmax:        184.00 cm/s MV E velocity: 41.10 cm/s MV A velocity: 90.10 cm/s  SHUNTS MV E/A ratio:  0.46        Systemic VTI:  0.11 m                            Systemic Diam: 2.50 cm Oswaldo Milian MD Electronically signed by Oswaldo Milian MD Signature Date/Time: 06/18/2022/4:09:45 PM    Final    CT ANGIO HEAD NECK W WO CM  Result Date: 06/18/2022 CLINICAL DATA:  Follow-up examination for stroke. EXAM: CT ANGIOGRAPHY HEAD AND NECK TECHNIQUE: Multidetector CT imaging of  the head and neck was performed using the standard protocol during bolus administration of intravenous contrast. Multiplanar CT image reconstructions and MIPs were obtained to evaluate the vascular anatomy. Carotid stenosis measurements (when applicable) are obtained utilizing NASCET criteria, using the distal internal carotid diameter as the denominator. RADIATION DOSE REDUCTION: This exam was performed according to the departmental dose-optimization program which includes automated exposure control, adjustment of the mA and/or kV according to patient size and/or use of iterative reconstruction technique. CONTRAST:  62mL OMNIPAQUE IOHEXOL 350 MG/ML SOLN COMPARISON:  Prior MRI from 06/17/2022. FINDINGS: CT HEAD FINDINGS Brain: Age-related cerebral atrophy with moderate chronic microvascular ischemic disease. Previously identified small right sided infarct not well visualized by CT. No other acute large vessel territory infarct. No acute intracranial hemorrhage. No mass lesion or midline shift. No hydrocephalus or extra-axial fluid collection. Vascular: No abnormal hyperdense vessel. Scattered vascular calcifications noted within the carotid siphons. Skull: Scalp soft tissues and calvarium demonstrate no acute finding. Sinuses/Orbits: Globes and orbital soft tissues within normal limits. Paranasal sinuses are largely clear. Small right  mastoid effusion noted, of doubtful significance. Other: None. Review of the MIP images confirms the above findings CTA NECK FINDINGS Aortic arch: Partially visualized aortic arch somewhat ectatic measuring up to 3.2 cm in diameter. Origin of the great vessels incompletely visualized on this exam. No visible stenosis. Right carotid system: Right common and internal carotid arteries are tortuous without dissection. Minimal atheromatous change about the right carotid bulb without stenosis. Left carotid system: Left common and internal carotid arteries are tortuous without dissection. No hemodynamically significant stenosis about the left carotid artery system. Vertebral arteries: Both vertebral arteries arise from the subclavian arteries. No visible proximal subclavian artery stenosis. Right vertebral artery strongly dominant, with a diffusely hypoplastic left vertebral artery. Vertebral arteries patent without stenosis or dissection. 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 No worrisome osseous lesions. Other neck: No other visible soft tissue abnormality within the neck. Upper chest: Emphysema. 3 mm right upper lobe nodule noted (series 7, image 129), indeterminate. Visualized upper chest demonstrates no other acute finding. Review of the MIP images confirms the above findings CTA HEAD FINDINGS Anterior circulation: Both internal carotid arteries are widely patent to the termini without stenosis or other abnormality. A1 segments, anterior communicating artery complex common anterior cerebral arteries widely patent without stenosis. No M1 stenosis or occlusion. No proximal MCA branch occlusion or high-grade stenosis. Distal MCA branches perfused and symmetric. Posterior circulation: Dominant right V4 segment widely patent. Right PICA patent. Hypoplastic left vertebral artery terminates in PICA. Left PICA patent as well. Basilar diminutive but patent without stenosis.  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 Superior cerebral arteries patent bilaterally. Fetal type origin of the PCAs bilaterally, both of which are widely patent to their distal aspects. Venous sinuses: Patent allowing for timing the contrast bolus. Anatomic variants: As above.  No aneurysm. Review of the MIP images confirms the above findings IMPRESSION: CT HEAD IMPRESSION: 1. Previously identified small right-sided infarct not well visualized by CT. No other acute intracranial abnormality. 2. Underlying age-related cerebral atrophy with moderate chronic microvascular ischemic disease. CTA HEAD AND NECK IMPRESSION: 1. Negative CTA for large vessel occlusion or other emergent finding. 2. Mild for age atheromatous disease without hemodynamically significant or correctable stenosis. 3. Diffuse tortuosity of the major arterial vasculature of the head and neck, suggesting chronic underlying hypertension. 4. 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 5. 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 Recommend annual imaging followup by CTA or MRA. This recommendation follows 2010 ACCF/AHA/AATS/ACR/ASA/SCA/SCAI/SIR/STS/SVM Guidelines for the Diagnosis and Management of Patients with Thoracic Aortic Disease. Circulation.2010; 121ML:4928372. Aortic aneurysm NOS (ICD10-I71.9). 6. 3 mm right upper lobe nodule, indeterminate. Per Fleischner Society Guidelines, a non-contrast Chest CT at 12 months is optional. If performed and the nodule is stable at 12 months, no further follow-up is recommended. These guidelines do not apply to immunocompromised patients and patients with cancer. Follow up in patients with significant comorbidities as clinically warranted. For lung cancer screening, adhere to Lung-RADS guidelines. Reference: Radiology. 2017; 284(1):228-43. Aortic Atherosclerosis (ICD10-I70.0) and Emphysema (ICD10-J43.9). Electronically Signed   By: Jeannine Boga M.D.    On: 06/18/2022 01:15   MR BRAIN WO CONTRAST  Result Date: 06/17/2022 CLINICAL DATA:  Neuro deficit, acute, stroke suspected. EXAM: MRI HEAD WITHOUT CONTRAST TECHNIQUE: Multiplanar, multiecho pulse sequences of the brain and surrounding structures were obtained without intravenous contrast. COMPARISON:  Head CT 08/15/2013 FINDINGS: Brain: There is a background pattern of chronic small vessel ischemic change throughout the pons and cerebral hemispheric white matter. There is an acute infarction within the right posterior frontal white matter, probably external capsule merging with corona radiata. This measures about 1 cm in size. No other acute infarction. No mass lesion, hemorrhage, hydrocephalus or extra-axial collection. Scattered punctate foci of hemosiderin deposition present in association with some of the old small vessel infarctions. Vascular: Major vessels at the base of the brain show flow. Skull and upper cervical spine: Negative Sinuses/Orbits: Paranasal sinuses are clear.  Orbits are normal. Other: Mastoid effusion on the right. No posterior nasopharyngeal mass is seen. 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 IMPRESSION: 1. 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 No swelling or hemorrhage. 2. Background pattern of chronic small-vessel ischemic changes throughout the pons and cerebral hemispheric white matter. Electronically Signed   By: Nelson Chimes M.D.   On: 06/17/2022 15:54     PERTINENT LAB RESULTS: CBC: Recent Labs    06/17/22 1348 06/17/22 1411  WBC 6.7  --   HGB 13.8 14.3  HCT 42.2 42.0  PLT 284  --    CMET CMP     Component Value Date/Time   NA 141 06/17/2022 1411   K 3.6 06/17/2022 1411   CL 101 06/17/2022 1411   CO2 28 06/17/2022 1348   GLUCOSE 89 06/17/2022 1411   BUN 28 (H) 06/17/2022 1411   CREATININE 1.00 06/17/2022 1411   CALCIUM 9.4 06/17/2022  1348   PROT 6.4 (L) 06/17/2022 1348   ALBUMIN 3.4 (L) 06/17/2022 1348   AST 18 06/17/2022 1348   ALT 15 06/17/2022 1348   ALKPHOS 80 06/17/2022 1348   BILITOT 0.6 06/17/2022 1348   GFRNONAA >60 06/17/2022 1348   GFRAA >90 08/15/2013 2010    GFR CrCl cannot be calculated (Unknown ideal weight.). No results for input(s): "LIPASE", "AMYLASE" in the last 72 hours. No results for input(s): "CKTOTAL", "CKMB", "CKMBINDEX", "TROPONINI" in the last 72 hours. Invalid input(s): "POCBNP" No results for input(s): "DDIMER" in the last 72 hours. Recent Labs    06/18/22 0620  HGBA1C 5.8*   Recent Labs    06/18/22 0620  CHOL 210*  HDL 52  LDLCALC 145*  TRIG 63  CHOLHDL 4.0   No results for input(s): "TSH", "T4TOTAL", "T3FREE", "THYROIDAB" in the last 72 hours.  Invalid input(s): "FREET3" No  results for input(s): "VITAMINB12", "FOLATE", "FERRITIN", "TIBC", "IRON", "RETICCTPCT" in the last 72 hours. Coags: Recent Labs    06/17/22 1348  INR 1.0   Microbiology: No results found for this or any previous visit (from the past 240 hour(s)).  FURTHER DISCHARGE INSTRUCTIONS:  Get Medicines reviewed and adjusted: Please take all your medications with you for your next visit with your Primary MD  Laboratory/radiological data: Please request your Primary MD to go over all hospital tests and procedure/radiological results at the follow up, please ask your Primary MD to get all Hospital records sent to his/her office.  In some cases, they will be blood work, cultures and biopsy results pending at the time of your discharge. Please request that your primary care M.D. goes through all the records of your hospital data and follows up on these results.  Also Note the following: If you experience worsening of your admission symptoms, develop shortness of breath, life threatening emergency, suicidal or homicidal thoughts you must seek medical attention immediately by calling 911 or calling your MD  immediately  if symptoms less severe.  You must read complete instructions/literature along with all the possible adverse reactions/side effects for all the Medicines you take and that have been prescribed to you. Take any new Medicines after you have completely understood and accpet all the possible adverse reactions/side effects.   Do not drive when taking Pain medications or sleeping medications (Benzodaizepines)  Do not take more than prescribed Pain, Sleep and Anxiety Medications. It is not advisable to combine anxiety,sleep and pain medications without talking with your primary care practitioner  Special Instructions: If you have smoked or chewed Tobacco  in the last 2 yrs please stop smoking, stop any regular Alcohol  and or any Recreational drug use.  Wear Seat belts while driving.  Please note: You were cared for by a hospitalist during your hospital stay. Once you are discharged, your primary care physician will handle any further medical issues. Please note that NO REFILLS for any discharge medications will be authorized once you are discharged, as it is imperative that you return to your primary care physician (or establish a relationship with a primary care physician if you do not have one) for your post hospital discharge needs so that they can reassess your need for medications and monitor your lab values.  Total Time spent coordinating discharge including counseling, education and face to face time equals greater than 30 minutes.  SignedOren Binet 06/18/2022 4:14 PM

## 2022-06-18 NOTE — Plan of Care (Signed)

## 2022-06-18 NOTE — Progress Notes (Signed)
Patient enrolled for [employer] / [employer] to ship a 30 day cardiac event monitor to his address on file.  Letter with instructions mailed to patient.  Dr. Harriet Masson to read.

## 2022-06-18 NOTE — Progress Notes (Signed)
PROGRESS NOTE        PATIENT DETAILS Name: [name] Age: 89 y.o. Sex: male Date of Birth: 06/07/1932 Admit Date: 06/17/2022 Admitting Physician Marcelyn Bruins, MD [facility]:QION, Arbie Cookey, MD  Brief Summary: Patient is a 89 y.o.  male with history of HTN,, neuropathy hearing impairment, macular degeneration-who presented with left arm/hand numbness-upon further evaluation patient was found to have a right posterior frontal white matter infarction on MRI brain.  Significant events: 2/6>> admit to [facility]-strokelike symptoms  Significant studies: 2/6>> MRI brain: 1 cm acute infarct in the right posterior frontal white matter 2/6>> CT angio head/neck: No LVO-no hemodynamically significant stenosis. 2/7>> LDL: 145 2/7>> A1c: 5.8  Significant microbiology data: None  Procedures: None  Consults: Neurology  Subjective: Extremely hard of hearing-His hearing aids have run out of battery-he essentially cannot understand what I say.  But he is able to move all 4 extremities.  Speech seems to be fluent and clear.  Objective: Vitals: Blood pressure 97/65, pulse 73, temperature 97.8 F (36.6 C), temperature source Oral, resp. rate 13, SpO2 97 %.   Exam: Gen Exam:Alert awake-not in any distress HEENT:atraumatic, normocephalic Chest: B/L clear to auscultation anteriorly CVS:S1S2 regular Abdomen:soft non tender, non distended Extremities:no edema Neurology: Non focal Skin: no rash  Pertinent Labs/Radiology:    Latest Ref Rng & Units 06/17/2022    2:11 PM 06/17/2022    1:48 PM 08/15/2013    8:10 PM  CBC  WBC 4.0 - 10.5 K/uL  6.7  11.7   Hemoglobin 13.0 - 17.0 g/dL 14.3  13.8  13.1   Hematocrit 39.0 - 52.0 % 42.0  42.2  38.2   Platelets 150 - 400 K/uL  284  348     Lab Results  Component Value Date   NA 141 06/17/2022   K 3.6 06/17/2022   CL 101 06/17/2022   CO2 28 06/17/2022      Assessment/Plan: Acute CVA Unclear whether this is small vessel  disease or a embolic CVA Telemetry negative for A-fib Await echo Await PT/OT/SLP eval Continue aspirin/Plavix High intensity statin added today Await further recommendations neurology  HTN Allow permissive hypertension Resume antihypertensives when able  HLD Continue high intensity statin  Peripheral neuropathy Continue Neurontin  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  Defer further to outpatient setting.  BMI: Estimated body mass index is 22.85 kg/m as calculated from the following:   Height as of 08/15/13: 5\' 3"  (1.6 m).   Weight as of 08/15/13: 58.5 kg.   Code status:   Code Status: Full Code   DVT Prophylaxis: SCDs Start: 06/17/22 1728 SQ Lovenox  Family Communication: Spouse Hilda-[phone]-updated over the phone on 2/7.   Disposition Plan: Status is: Observation The patient will require care spanning > 2 midnights and should be moved to inpatient because: Severity of illness-stroke workup/evaluation to be completed inpatient.   Planned Discharge Destination:Home health   Diet: Diet Order             Diet NPO  time specified  Diet effective now                     Antimicrobial agents: Anti-infectives (From admission, onward)    None        MEDICATIONS: Scheduled Meds:   stroke: early stages of recovery book   Does not apply Once   aspirin EC  81 mg Oral Daily   clopidogrel  75 mg Oral Daily   enoxaparin (LOVENOX) injection  40 mg Subcutaneous Q24H   gabapentin  300 mg Oral QHS   rosuvastatin  20 mg Oral Daily   sodium chloride flush  3 mL  Intravenous Once   Continuous Infusions: PRN Meds:.acetaminophen **OR** acetaminophen (TYLENOL) oral liquid 160 mg/5 mL **OR** acetaminophen, senna-docusate   I have personally reviewed following labs and imaging studies  LABORATORY DATA: CBC: Recent Labs  Lab 06/17/22 1348 06/17/22 1411  WBC 6.7  --   NEUTROABS 4.8  --   HGB 13.8 14.3  HCT 42.2 42.0  MCV 92.1  --   PLT 284  --     Basic Metabolic Panel: Recent Labs  Lab 06/17/22 1348 06/17/22 1411  NA 138 141  K 3.6 3.6  CL 99 101  CO2 28  --   GLUCOSE 91 89  BUN 22 28*  CREATININE 1.00 1.00  CALCIUM 9.4  --     GFR: CrCl cannot be calculated (Unknown ideal weight.).  Liver Function Tests: Recent Labs  Lab 06/17/22 1348  AST 18  ALT 15  ALKPHOS 80  BILITOT 0.6  PROT 6.4*  ALBUMIN 3.4*   No results for input(s): "LIPASE", "AMYLASE" in the last 168 hours. No results for input(s): "AMMONIA" in the last 168 hours.  Coagulation Profile: Recent Labs  Lab 06/17/22 1348  INR 1.0    Cardiac Enzymes: No results for input(s): "CKTOTAL", "CKMB", "CKMBINDEX", "TROPONINI" in the last 168 hours.  BNP (last 3 results) No results for input(s): "PROBNP" in the last 8760 hours.  Lipid Profile: Recent Labs    06/18/22 0620  CHOL 210*  HDL 52  LDLCALC 145*  TRIG 63  CHOLHDL 4.0    Thyroid Function Tests: No results for input(s): "TSH", "T4TOTAL", "FREET4", "T3FREE", "THYROIDAB" in the last 72 hours.  Anemia Panel: No results for input(s): "VITAMINB12", "FOLATE", "FERRITIN", "TIBC", "IRON", "RETICCTPCT" in the last 72 hours.  Urine analysis:    Component Value Date/Time   COLORURINE YELLOW 12/31/2009 1024   APPEARANCEUR CLEAR 12/31/2009 1024   LABSPEC 1.021 12/31/2009 1024   PHURINE 6.0 12/31/2009 1024   GLUCOSEU NEGATIVE 12/31/2009 1024   HGBUR NEGATIVE 12/31/2009 1024   BILIRUBINUR NEGATIVE 12/31/2009 1024   KETONESUR NEGATIVE 12/31/2009 1024   PROTEINUR NEGATIVE 12/31/2009 1024    UROBILINOGEN 0.2 12/31/2009 1024   NITRITE NEGATIVE 12/31/2009 1024   LEUKOCYTESUR TRACE (A) 12/31/2009 1024    Sepsis Labs: Lactic Acid, Venous No results found for: "LATICACIDVEN"  MICROBIOLOGY: No results found for this or any previous visit (from the past 240 hour(s)).  RADIOLOGY STUDIES/RESULTS: CT ANGIO HEAD NECK W WO CM  Result Date: 06/18/2022 CLINICAL DATA:  Follow-up examination for stroke. EXAM: CT ANGIOGRAPHY HEAD AND NECK TECHNIQUE: Multidetector CT imaging of the head and neck was performed using the standard protocol during bolus administration of intravenous contrast. Multiplanar CT image reconstructions and MIPs were obtained to evaluate the vascular anatomy. Carotid stenosis measurements (when applicable) are obtained utilizing NASCET criteria, using the distal internal carotid diameter as the denominator. RADIATION DOSE  REDUCTION: This exam was performed according to the departmental dose-optimization program which includes automated exposure control, adjustment of the mA and/or kV according to patient size and/or use of iterative reconstruction technique. CONTRAST:  58mL OMNIPAQUE IOHEXOL 350 MG/ML SOLN COMPARISON:  Prior MRI from 06/17/2022. FINDINGS: CT HEAD FINDINGS Brain: Age-related cerebral atrophy with moderate chronic microvascular ischemic disease. Previously identified small right sided infarct not well visualized by CT. No other acute large vessel territory infarct. No acute intracranial hemorrhage. No mass lesion or midline shift. No hydrocephalus or extra-axial fluid collection. Vascular: No abnormal hyperdense vessel. Scattered vascular calcifications noted within the carotid siphons. Skull: Scalp soft tissues and calvarium demonstrate no acute finding. Sinuses/Orbits: Globes and orbital soft tissues within normal limits. Paranasal sinuses are largely clear. Small right mastoid effusion noted, of doubtful significance. Other: None. Review of the MIP images confirms the  above findings CTA NECK FINDINGS Aortic arch: Partially visualized aortic arch somewhat ectatic measuring up to 3.2 cm in diameter. Origin of the great vessels incompletely visualized on this exam. No visible stenosis. Right carotid system: Right common and internal carotid arteries are tortuous without dissection. Minimal atheromatous change about the right carotid bulb without stenosis. Left carotid system: Left common and internal carotid arteries are tortuous without dissection. No hemodynamically significant stenosis about the left carotid artery system. Vertebral arteries: Both vertebral arteries arise from the subclavian arteries. No visible proximal subclavian artery stenosis. Right vertebral artery strongly dominant, with a diffusely hypoplastic left vertebral artery. Vertebral arteries patent without stenosis or dissection. 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 No worrisome osseous lesions. Other neck: No other visible soft tissue abnormality within the neck. Upper chest: Emphysema. 3 mm right upper lobe nodule noted (series 7, image 129), indeterminate. Visualized upper chest demonstrates no other acute finding. Review of the MIP images confirms the above findings CTA HEAD FINDINGS Anterior circulation: Both internal carotid arteries are widely patent to the termini without stenosis or other abnormality. A1 segments, anterior communicating artery complex common anterior cerebral arteries widely patent without stenosis. No M1 stenosis or occlusion. No proximal MCA branch occlusion or high-grade stenosis. Distal MCA branches perfused and symmetric. Posterior circulation: Dominant right V4 segment widely patent. Right PICA patent. Hypoplastic left vertebral artery terminates in PICA. Left PICA patent as well. Basilar diminutive but patent without stenosis. 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 Superior cerebral arteries patent bilaterally.  Fetal type origin of the PCAs bilaterally, both of which are widely patent to their distal aspects. Venous sinuses: Patent allowing for timing the contrast bolus. Anatomic variants: As above.  No aneurysm. Review of the MIP images confirms the above findings IMPRESSION: CT HEAD IMPRESSION: 1. Previously identified small right-sided infarct not well visualized by CT. No other acute intracranial abnormality. 2. Underlying age-related cerebral atrophy with moderate chronic microvascular ischemic disease. CTA HEAD AND NECK IMPRESSION: 1. Negative CTA for large vessel occlusion or other emergent finding. 2. Mild for age atheromatous disease without hemodynamically significant or correctable stenosis. 3. Diffuse tortuosity of the major arterial vasculature of the head and neck, suggesting chronic underlying hypertension. 4. 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 5. Dilatation of the aortic arch up to 3.2 cm. Recommend annual imaging followup by CTA or MRA. This recommendation follows 2010 ACCF/AHA/AATS/ACR/ASA/SCA/SCAI/SIR/STS/SVM Guidelines for the Diagnosis and Management of Patients with Thoracic Aortic Disease. Circulation.2010; 121: Y850-Y774. Aortic aneurysm NOS (ICD10-I71.9). 6. 3 mm right upper lobe nodule, indeterminate. Per Fleischner Society Guidelines,  a non-contrast Chest CT at 12 months is optional. If performed and the nodule is stable at 12 months, no further follow-up is recommended. These guidelines do not apply to immunocompromised patients and patients with cancer. Follow up in patients with significant comorbidities as clinically warranted. For lung cancer screening, adhere to Lung-RADS guidelines. Reference: Radiology. 2017; 284(1):228-43. Aortic Atherosclerosis (ICD10-I70.0) and Emphysema (ICD10-J43.9). Electronically Signed   By: Rise Mu M.D.   On: 06/18/2022 01:15   MR BRAIN WO CONTRAST  Result Date: 06/17/2022 CLINICAL DATA:  Neuro deficit, acute,  stroke suspected. EXAM: MRI HEAD WITHOUT CONTRAST TECHNIQUE: Multiplanar, multiecho pulse sequences of the brain and surrounding structures were obtained without intravenous contrast. COMPARISON:  Head CT 08/15/2013 FINDINGS: Brain: There is a background pattern of chronic small vessel ischemic change throughout the pons and cerebral hemispheric white matter. There is an acute infarction within the right posterior frontal white matter, probably external capsule merging with corona radiata. This measures about 1 cm in size. No other acute infarction. No mass lesion, hemorrhage, hydrocephalus or extra-axial collection. Scattered punctate foci of hemosiderin deposition present in association with some of the old small vessel infarctions. Vascular: Major vessels at the base of the brain show flow. Skull and upper cervical spine: Negative Sinuses/Orbits: Paranasal sinuses are clear.  Orbits are normal. Other: Mastoid effusion on the right. No posterior nasopharyngeal mass is seen. 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 IMPRESSION: 1. 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 No swelling or hemorrhage. 2. Background pattern of chronic small-vessel ischemic changes throughout the pons and cerebral hemispheric white matter. Electronically Signed   By: Paulina Fusi M.D.   On: 06/17/2022 15:54     LOS: 0 days   Jeoffrey Massed, MD  [facility]    To contact the attending provider between 7A-7P or the covering provider during after hours 7P-7A, please log into the web site [email or web address] and access using universal [place] password for that web site. If you do not have the password, please call the hospital operator.  06/18/2022, 10:29 AM

## 2022-06-18 NOTE — Plan of Care (Signed)

## 2022-06-19 ENCOUNTER — Other Ambulatory Visit (HOSPITAL_COMMUNITY): Payer: Self-pay

## 2022-06-25 DIAGNOSIS — I719 Aortic aneurysm of unspecified site, without rupture: Secondary | ICD-10-CM | POA: Diagnosis not present

## 2022-06-25 DIAGNOSIS — Z8673 Personal history of transient ischemic attack (TIA), and cerebral infarction without residual deficits: Secondary | ICD-10-CM | POA: Diagnosis not present

## 2022-06-25 DIAGNOSIS — Z09 Encounter for follow-up examination after completed treatment for conditions other than malignant neoplasm: Secondary | ICD-10-CM | POA: Diagnosis not present

## 2022-06-26 ENCOUNTER — Ambulatory Visit: Payer: Medicare Other | Attending: Cardiology

## 2022-06-26 DIAGNOSIS — I639 Cerebral infarction, unspecified: Secondary | ICD-10-CM | POA: Diagnosis not present

## 2022-06-26 DIAGNOSIS — Z09 Encounter for follow-up examination after completed treatment for conditions other than malignant neoplasm: Secondary | ICD-10-CM | POA: Diagnosis not present

## 2022-06-26 DIAGNOSIS — I452 Bifascicular block: Secondary | ICD-10-CM

## 2022-06-26 DIAGNOSIS — I4891 Unspecified atrial fibrillation: Secondary | ICD-10-CM | POA: Diagnosis not present

## 2022-06-26 DIAGNOSIS — I493 Ventricular premature depolarization: Secondary | ICD-10-CM | POA: Diagnosis not present

## 2022-06-28 ENCOUNTER — Telehealth: Payer: Self-pay | Admitting: Physician Assistant

## 2022-06-28 NOTE — Telephone Encounter (Signed)
Patient not established with our office.   [employer] called to notify an initial rhythm of sinus with rare PVC and a 19-beat run of NSVT. Strip to be faxed to the office. Patient not [employer]. Echo with preserved LVSF. Will route to ordering provider as an FYI.

## 2022-06-30 NOTE — Telephone Encounter (Signed)
   Cardiac Monitor Alert  Date of alert:  06/30/2022   Patient Name: [name]  DOB: Apr 28, 1933  MRN: [id number]   [place] Cardiologist: None  [facility] EP:  None    Monitor Information: Cardiac Event Monitor [[employer]]  Reason:  CVA/ A-fib? Ordering provider:  Dr. Harriet Masson   Alert Ventricular Tachycardia (19 beats) This is the 1st alert for this rhythm.   Next Cardiology Appointment   Date:  n/a  Provider:  Dr. Harriet Masson  The patient was contacted today.  He is asymptomatic. Arrhythmia, symptoms and history reviewed with Dr. Audie Box (DOD).   Plan:  Continue to monitor    Beatrix Fetters, RN  06/30/2022 11:42 AM

## 2022-07-01 ENCOUNTER — Telehealth: Payer: Self-pay

## 2022-07-01 NOTE — Telephone Encounter (Signed)
Wife stated patient is asymptomatic this morning. Has appointment with Dr. Harriet Masson on 3/18. Explained to wife that if patient has SOB, presyncope/syncope, to go to the ED.

## 2022-07-28 ENCOUNTER — Encounter: Payer: Self-pay | Admitting: Cardiology

## 2022-07-28 ENCOUNTER — Ambulatory Visit: Payer: Medicare Other | Attending: Cardiology | Admitting: Cardiology

## 2022-07-28 VITALS — BP 142/85 | HR 75 | Ht 63.0 in | Wt 126.6 lb

## 2022-07-28 DIAGNOSIS — I4729 Other ventricular tachycardia: Secondary | ICD-10-CM

## 2022-07-28 DIAGNOSIS — I639 Cerebral infarction, unspecified: Secondary | ICD-10-CM | POA: Diagnosis not present

## 2022-07-28 DIAGNOSIS — E782 Mixed hyperlipidemia: Secondary | ICD-10-CM

## 2022-07-28 DIAGNOSIS — I10 Essential (primary) hypertension: Secondary | ICD-10-CM

## 2022-07-28 MED ORDER — METOPROLOL SUCCINATE ER 25 MG PO TB24: 12.5000 mg | ORAL_TABLET | Freq: Every evening | ORAL | 3 refills | Status: DC

## 2022-07-28 MED ORDER — ROSUVASTATIN CALCIUM 10 MG PO TABS: 10.0000 mg | ORAL_TABLET | Freq: Every day | ORAL | 3 refills | Status: DC

## 2022-07-28 NOTE — Patient Instructions (Addendum)
Medication Instructions:  Your physician has recommended you make the following change in your medication:  START: Crestor (Rosuvastatin) 10 mg (one tablet) nightly  START: Metoprolol succinate (Toprol-XL) 12.5 mg (one tablet) nightly *If you need a refill on your cardiac medications before your next appointment, please call your pharmacy*   Lab Work: None  Testing/Procedures: None   Follow-Up: At [facility], you and your health needs are our priority.  As part of our continuing mission to provide you with exceptional heart care, we have created designated Provider Care Teams.  These Care Teams include your primary Cardiologist (physician) and Advanced Practice Providers (APPs -  Physician Assistants and Nurse Practitioners) who all work together to provide you with the care you need, when you need it.  We recommend signing up for the patient portal called "MyChart".  Sign up information is provided on this After Visit Summary.  MyChart is used to connect with patients for Virtual Visits (Telemedicine).  Patients are able to view lab/test results, encounter notes, upcoming appointments, etc.  Non-urgent messages can be sent to your provider as well.   To learn more about what you can do with MyChart, go to [email or web address].    Your next appointment:   6 month(s)  Provider:   Berniece Salines, DO

## 2022-07-29 NOTE — Progress Notes (Signed)
Cardiology Office Note:    Date:  07/30/2022   ID:  [name], DOB 06-27-1932, MRN [id number]  PCP:  London Pepper, MD  Cardiologist:  Berniece Salines, DO  Electrophysiologist:  None   Referring MD: Maurice Small, MD   " I am doing fine"  History of Present Illness:    [name] is a 89 y.o. male with a hx of HTN,, neuropathy hearing impairment, macular degeneration recently admitted at [facility] found to have acute function in the right posterior frontal white matter.  In the hospitalization he was started on aspirin and Plavix and his telemetry did not show any evidence of atrial fibrillation.  He was placed amatory monitor and is here today for follow-up visit.  He is here today with his wife he offers no particular complaints at this time.   Past Medical History:  Diagnosis Date   Hypertension    Macular degeneration     Past Surgical History:  Procedure Laterality Date   CATARACT EXTRACTION     EYE SURGERY     ROTATOR CUFF REPAIR Bilateral     Current Medications: Current Meds  Medication Sig   Ascorbic Acid (VITAMIN C PO) Take 1 tablet by mouth every Monday, Wednesday, and Friday. Takes along with vit e   aspirin EC 81 MG tablet Take 1 tablet (81 mg total) by mouth daily. Swallow whole.   b complex vitamins capsule Take 1 capsule by mouth daily in the afternoon.   Calcium Carb-Cholecalciferol (CALCIUM 600 + D PO) Take 1 tablet by mouth 3 (three) times a week.   gabapentin (NEURONTIN) 300 MG capsule Take 300 mg by mouth at bedtime.   Glucosamine HCl (GLUCOSAMINE PO) Take 1 tablet by mouth in the morning and at bedtime.   lisinopril (ZESTRIL) 20 MG tablet Take 1 tablet (20 mg total) by mouth daily in the afternoon.   lisinopril-hydrochlorothiazide (ZESTORETIC) 20-12.5 MG tablet Take 1 tablet by mouth daily.   metoprolol succinate (TOPROL XL) 25 MG 24 hr tablet Take 0.5 tablets (12.5 mg total) by mouth at bedtime.   Multiple Vitamin (MULTIVITAMIN WITH  MINERALS) TABS tablet Take 1 tablet by mouth daily.   Multiple Vitamins-Minerals (PRESERVISION AREDS PO) Take 1 tablet by mouth daily in the afternoon.   Omega-3 Fatty Acids (FISH OIL) 1000 MG CAPS Take 1,000 mg by mouth daily.   Potassium (POTASSIMIN PO) Take 1 tablet by mouth in the morning and at bedtime.   rosuvastatin (CRESTOR) 10 MG tablet Take 1 tablet (10 mg total) by mouth daily.   vitamin E 180 MG (400 UNITS) capsule Take 400 Units by mouth every Monday, Wednesday, and Friday.     Allergies:   Prednisone   Social History   Socioeconomic History   Marital status: Married    Spouse name: Not on file   Number of children: Not on file   Years of education: Not on file   Highest education level: Not on file  Occupational History   Not on file  Tobacco Use   Smoking status: Never   Smokeless tobacco: Not on file  Substance and Sexual Activity   Alcohol use: No   Drug use: No   Sexual activity: Not on file  Other Topics Concern   Not on file  Social History Narrative   Not on file   Social Determinants of Health   Financial Resource Strain: Not on file  Food Insecurity: Not on file  Transportation Needs: Not on file  Physical Activity: Not on file  Stress: Not on file  Social Connections: Not on file     Family History: The patient's family history is not on file.  ROS:   Review of Systems  Constitution: Negative for decreased appetite, fever and weight gain.  HENT: Negative for congestion, ear discharge, hoarse voice and sore throat.   Eyes: Negative for discharge, redness, vision loss in right eye and visual halos.  Cardiovascular: Negative for chest pain, dyspnea on exertion, leg swelling, orthopnea and palpitations.  Respiratory: Negative for cough, hemoptysis, shortness of breath and snoring.   Endocrine: Negative for heat intolerance and polyphagia.  Hematologic/Lymphatic: Negative for bleeding problem. Does not bruise/bleed easily.  Skin: Negative for  flushing, nail changes, rash and suspicious lesions.  Musculoskeletal: Negative for arthritis, joint pain, muscle cramps, myalgias, neck pain and stiffness.  Gastrointestinal: Negative for abdominal pain, bowel incontinence, diarrhea and excessive appetite.  Genitourinary: Negative for decreased libido, genital sores and incomplete emptying.  Neurological: Negative for brief paralysis, focal weakness, headaches and loss of balance.  Psychiatric/Behavioral: Negative for altered mental status, depression and suicidal ideas.  Allergic/Immunologic: Negative for HIV exposure and persistent infections.    EKGs/Labs/Other Studies Reviewed:    The following studies were reviewed today:   EKG:  None today   Ambulatory monitor 07/17/2022 The patient wore the monitor for 30 days.  Indication: Acute CVA The minimum heart rate was 63  bpm, maximum heart rate was  119 bpm, and average heart rate was 85  bpm. 19 episodes of NSVT noted. Predominant underlying rhythm was Sinus Rhythm.     Premature atrial complexes were rare. Premature Ventricular complexes rare.   No pauses, No AV block and no atrial fibrillation present.     Conclusion: This study is remarkable for 19 beats of NSVT.  Recent Labs: 06/17/2022: ALT 15; BUN 28; Creatinine, Ser 1.00; Hemoglobin 14.3; Platelets 284; Potassium 3.6; Sodium 141  Recent Lipid Panel    Component Value Date/Time   CHOL 210 (H) 06/18/2022 0620   TRIG 63 06/18/2022 0620   HDL 52 06/18/2022 0620   CHOLHDL 4.0 06/18/2022 0620   VLDL 13 06/18/2022 0620   LDLCALC 145 (H) 06/18/2022 0620    Physical Exam:    VS:  BP (!) 142/85   Pulse 75   Ht 5\' 3"  (1.6 m)   Wt 57.4 kg   BMI 22.43 kg/m     Wt Readings from Last 3 Encounters:  07/28/22 57.4 kg  08/15/13 58.5 kg  08/08/13 58.5 kg     GEN: Well nourished, well developed in no acute distress HEENT: Normal NECK: No JVD; No carotid bruits LYMPHATICS: No lymphadenopathy CARDIAC: S1S2 noted,RRR, no  murmurs, rubs, gallops RESPIRATORY:  Clear to auscultation without rales, wheezing or rhonchi  ABDOMEN: Soft, non-tender, non-distended, +bowel sounds, no guarding. EXTREMITIES: No edema, No cyanosis, no clubbing MUSCULOSKELETAL:  No deformity  SKIN: Warm and dry NEUROLOGIC:  Alert and oriented x 3, non-focal PSYCHIATRIC:  Normal affect, good insight  ASSESSMENT:    1. Acute CVA (cerebrovascular accident) ([place])   2. Primary hypertension   3. NSVT (nonsustained ventricular tachycardia) ([place])   4. Mixed hyperlipidemia    PLAN:     His monitor did not show afib but significant PSVT. He is also symptomatic, will start toprol 12.5 mg daily.  He has not been taking the Crestor will restart this. Continue aspirin.  The patient is in agreement with the above plan. The patient left the office in  stable condition.  The patient will follow up in 6 months or sooner if needed.   Medication Adjustments/Labs and Tests Ordered: Current medicines are reviewed at length with the patient today.  Concerns regarding medicines are outlined above.  No orders of the defined types were placed in this encounter.  Meds ordered this encounter  Medications   rosuvastatin (CRESTOR) 10 MG tablet    Sig: Take 1 tablet (10 mg total) by mouth daily.    Dispense:  90 tablet    Refill:  3   metoprolol succinate (TOPROL XL) 25 MG 24 hr tablet    Sig: Take 0.5 tablets (12.5 mg total) by mouth at bedtime.    Dispense:  45 tablet    Refill:  3    Patient Instructions  Medication Instructions:  Your physician has recommended you make the following change in your medication:  START: Crestor (Rosuvastatin) 10 mg (one tablet) nightly  START: Metoprolol succinate (Toprol-XL) 12.5 mg (one tablet) nightly *If you need a refill on your cardiac medications before your next appointment, please call your pharmacy*   Lab Work: None  Testing/Procedures: None   Follow-Up: At [facility], you and your  health needs are our priority.  As part of our continuing mission to provide you with exceptional heart care, we have created designated Provider Care Teams.  These Care Teams include your primary Cardiologist (physician) and Advanced Practice Providers (APPs -  Physician Assistants and Nurse Practitioners) who all work together to provide you with the care you need, when you need it.  We recommend signing up for the patient portal called "MyChart".  Sign up information is provided on this After Visit Summary.  MyChart is used to connect with patients for Virtual Visits (Telemedicine).  Patients are able to view lab/test results, encounter notes, upcoming appointments, etc.  Non-urgent messages can be sent to your provider as well.   To learn more about what you can do with MyChart, go to [email or web address].    Your next appointment:   6 month(s)  Provider:   Berniece Salines, DO     Adopting a Healthy Lifestyle.  Know what a healthy weight is for you (roughly BMI <25) and aim to maintain this   Aim for 7+ servings of fruits and vegetables daily   65-80+ fluid ounces of water or unsweet tea for healthy kidneys   Limit to max 1 drink of alcohol per day; avoid smoking/tobacco   Limit animal fats in diet for cholesterol and heart health - choose grass fed whenever available   Avoid highly processed foods, and foods high in saturated/trans fats   Aim for low stress - take time to unwind and care for your mental health   Aim for 150 min of moderate intensity exercise weekly for heart health, and weights twice weekly for bone health   Aim for 7-9 hours of sleep daily   When it comes to diets, agreement about the perfect plan isnt easy to find, even among the experts. Experts at the [employer] developed an idea known as the Healthy Eating Plate. Just imagine a plate divided into logical, healthy portions.   The emphasis is on diet quality:   Load up on vegetables  and fruits - one-half of your plate: Aim for color and variety, and remember that potatoes dont count.   Go for whole grains - one-quarter of your plate: Whole wheat, barley, wheat berries, quinoa, oats, brown rice, and foods made with  them. If you want pasta, go with whole wheat pasta.   Protein power - one-quarter of your plate: Fish, chicken, beans, and nuts are all healthy, versatile protein sources. Limit red meat.   The diet, however, does go beyond the plate, offering a few other suggestions.   Use healthy plant oils, such as olive, canola, soy, corn, sunflower and peanut. Check the labels, and avoid partially hydrogenated oil, which have unhealthy trans fats.   If youre thirsty, drink water. Coffee and tea are good in moderation, but skip sugary drinks and limit milk and dairy products to one or two daily servings.   The type of carbohydrate in the diet is more important than the amount. Some sources of carbohydrates, such as vegetables, fruits, whole grains, and beans-are healthier than others.   Finally, stay active  Signed, Berniece Salines, DO  07/30/2022 7:59 AM    [place]

## 2022-08-25 ENCOUNTER — Encounter: Payer: Self-pay | Admitting: Neurology

## 2022-08-25 ENCOUNTER — Ambulatory Visit: Payer: Medicare Other | Admitting: Neurology

## 2022-08-25 VITALS — BP 116/82 | HR 88 | Ht 61.0 in | Wt 123.0 lb

## 2022-08-25 DIAGNOSIS — I6381 Other cerebral infarction due to occlusion or stenosis of small artery: Secondary | ICD-10-CM

## 2022-08-25 DIAGNOSIS — R29898 Other symptoms and signs involving the musculoskeletal system: Secondary | ICD-10-CM

## 2022-08-25 DIAGNOSIS — E782 Mixed hyperlipidemia: Secondary | ICD-10-CM | POA: Diagnosis not present

## 2022-08-25 NOTE — Patient Instructions (Signed)
I had a long d/w patient and his wife about his recent lacunar stroke, risk for recurrent stroke/TIAs, personally independently reviewed imaging studies and stroke evaluation results and answered questions.Continue aspirin 81 mg daily  for secondary stroke prevention and maintain strict control of hypertension with blood pressure goal below 130/90, diabetes with hemoglobin A1c goal below 6.5% and lipids with LDL cholesterol goal below 70 mg/dL. I also advised the patient to eat a healthy diet with plenty of whole grains, cereals, fruits and vegetables, exercise regularly and maintain ideal body weight I advised him to be compliant with taking his cholesterol medication.  Followup in the future with my nurse practitioner in 4 months or call earlier if necessary.  Stroke Prevention Some medical conditions and behaviors can lead to a higher chance of having a stroke. You can help prevent a stroke by eating healthy, exercising, not smoking, and managing any medical conditions you have. Stroke is a leading cause of functional impairment. Primary prevention is particularly important because a majority of strokes are first-time events. Stroke changes the lives of not only those who experience a stroke but also their family and other caregivers. How can this condition affect me? A stroke is a medical emergency and should be treated right away. A stroke can lead to brain damage and can sometimes be life-threatening. If a person gets medical treatment right away, there is a better chance of surviving and recovering from a stroke. What can increase my risk? The following medical conditions may increase your risk of a stroke: Cardiovascular disease. High blood pressure (hypertension). Diabetes. High cholesterol. Sickle cell disease. Blood clotting disorders (hypercoagulable state). Obesity. Sleep disorders (obstructive sleep apnea). Other risk factors include: Being older than age 70. Having a history of  blood clots, stroke, or mini-stroke (transient ischemic attack, TIA). Genetic factors, such as race, ethnicity, or a family history of stroke. Smoking cigarettes or using other tobacco products. Taking birth control pills, especially if you also use tobacco. Heavy use of alcohol or drugs, especially cocaine and methamphetamine. Physical inactivity. What actions can I take to prevent this? Manage your health conditions High cholesterol levels. Eating a healthy diet is important for preventing high cholesterol. If cholesterol cannot be managed through diet alone, you may need to take medicines. Take any prescribed medicines to control your cholesterol as told by your health care provider. Hypertension. To reduce your risk of stroke, try to keep your blood pressure below 130/80. Eating a healthy diet and exercising regularly are important for controlling blood pressure. If these steps are not enough to manage your blood pressure, you may need to take medicines. Take any prescribed medicines to control hypertension as told by your health care provider. Ask your health care provider if you should monitor your blood pressure at home. Have your blood pressure checked every year, even if your blood pressure is normal. Blood pressure increases with age and some medical conditions. Diabetes. Eating a healthy diet and exercising regularly are important parts of managing your blood sugar (glucose). If your blood sugar cannot be managed through diet and exercise, you may need to take medicines. Take any prescribed medicines to control your diabetes as told by your health care provider. Get evaluated for obstructive sleep apnea. Talk to your health care provider about getting a sleep evaluation if you snore a lot or have excessive sleepiness. Make sure that any other medical conditions you have, such as atrial fibrillation or atherosclerosis, are managed. Nutrition Follow instructions from your health care  provider about what to eat or drink to help manage your health condition. These instructions may include: Reducing your daily calorie intake. Limiting how much salt (sodium) you use to 1,500 milligrams (mg) each day. Using only healthy fats for cooking, such as olive oil, canola oil, or sunflower oil. Eating healthy foods. You can do this by: Choosing foods that are high in fiber, such as whole grains, and fresh fruits and vegetables. Eating at least 5 servings of fruits and vegetables a day. Try to fill one-half of your plate with fruits and vegetables at each meal. Choosing lean protein foods, such as lean cuts of meat, poultry without skin, fish, tofu, beans, and nuts. Eating low-fat dairy products. Avoiding foods that are high in sodium. This can help lower blood pressure. Avoiding foods that have saturated fat, trans fat, and cholesterol. This can help prevent high cholesterol. Avoiding processed and prepared foods. Counting your daily carbohydrate intake.  Lifestyle If you drink alcohol: Limit how much you have to: 0-1 drink a day for women who are not pregnant. 0-2 drinks a day for men. Know how much alcohol is in your drink. In the [place] one drink equals one 12 oz bottle of beer (324m), one 5 oz glass of wine (1410m, or one 1 oz glass of hard liquor (442m Do not use any products that contain nicotine or tobacco. These products include cigarettes, chewing tobacco, and vaping devices, such as e-cigarettes. If you need help quitting, ask your health care provider. Avoid secondhand smoke. Do not use drugs. Activity  Try to stay at a healthy weight. Get at least 30 minutes of exercise on most days, such as: Fast walking. Biking. Swimming. Medicines Take over-the-counter and prescription medicines only as told by your health care provider. Aspirin or blood thinners (antiplatelets or anticoagulants) may be recommended to reduce your risk of forming blood clots that can lead to  stroke. Avoid taking birth control pills. Talk to your health care provider about the risks of taking birth control pills if: You are over 35 29ars old. You smoke. You get very bad headaches. You have had a blood clot. Where to find more information American Stroke Association: www.strokeassociation.org Get help right away if: You or a loved one has any symptoms of a stroke. "BE FAST" is an easy way to remember the main warning signs of a stroke: B - Balance. Signs are dizziness, sudden trouble walking, or loss of balance. E - Eyes. Signs are trouble seeing or a sudden change in vision. F - Face. Signs are sudden weakness or numbness of the face, or the face or eyelid drooping on one side. A - Arms. Signs are weakness or numbness in an arm. This happens suddenly and usually on one side of the body. S - Speech. Signs are sudden trouble speaking, slurred speech, or trouble understanding what people say. T - Time. Time to call emergency services. Write down what time symptoms started. You or a loved one has other signs of a stroke, such as: A sudden, severe headache with no known cause. Nausea or vomiting. Seizure. These symptoms may represent a serious problem that is an emergency. Do not wait to see if the symptoms will go away. Get medical help right away. Call your local emergency services (911 in the [place] Do not drive yourself to the hospital. Summary You can help to prevent a stroke by eating healthy, exercising, not smoking, limiting alcohol intake, and managing any medical conditions you may have. Do not  use any products that contain nicotine or tobacco. These include cigarettes, chewing tobacco, and vaping devices, such as e-cigarettes. If you need help quitting, ask your health care provider. Remember "BE FAST" for warning signs of a stroke. Get help right away if you or a loved one has any of these signs. This information is not intended to replace advice given to you by your  health care provider. Make sure you discuss any questions you have with your health care provider. Document Revised: 11/10/2019 Document Reviewed: 11/28/2019 Elsevier Patient Education  2023 [employer].

## 2022-08-25 NOTE — Progress Notes (Signed)
[facility] [address] [place]. [place] [address] [phone]       OFFICE CONSULT NOTE  [name] Date of Birth:  1933-04-04 Medical Record Number:  [id number]   Referring MD:  Windell Norfolk  Reason for Referral: Stroke  HPI: Mr. Tuckerman is a pleasant 89 year old Caucasian male seen today for initial office consultation visit for stroke.  He is accompanied by his wife.  History is obtained from them and review of electronic medical records.  I personally reviewed pertinent available imaging films in PACS.  He has past medical history significant for hypertension and macular degeneration.  He presented on 06/17/2022 with sudden onset of left hand weakness numbness and facial droop.  He noticed the symptoms that morning upon awakening from sleep but did not seek medical attention.  He presented outside time window for thrombolysis.  CT head was unremarkable but MRI scan showed a right posterior frontal white matter lacunar infarct measuring 1 cm.  CT angiogram of the brain and neck shows no large vessel stenosis or occlusion.  Echocardiogram showed ejection fraction of 60 to 65%.  Hemoglobin A1c was 5.8.  LDL cholesterol was elevated at 145 mg percent.  Patient was on aspirin and was advised to take Plavix for 3 weeks and then switch to Plavix however he has done the opposite and stop Plavix and is on aspirin 81 mg daily alone now.Marland Kitchen  He did well for 3 weeks external cardiac monitor from 06/26/22/7/24 which was negative for paroxysmal A-fib.  He states his left hand numbness has resolved he still has some diminished fine motor skills and mild weakness but he is has been able to use his left hand for more activities now though not to the same extent as before.  He did not get any outpatient physical Occupational Therapy.  He is quite independent wants to go back to do all previous activities.  He has no prior history of strokes, TIAs or significant neurological problems.  Patient was  supposed to be on Crestor 10 mg daily but he has not started taking it.  He states he is needing a healthy diet with lots of vegetables and fruits and felt that that may be enough to reduce his cholesterol.  He does take a fish oil 1000 mg capsule daily.  ROS:   14 system review of systems is positive for weakness, numbness and all other systems negative  PMH:  Past Medical History:  Diagnosis Date   Hypertension    Macular degeneration     Social History:  Social History   Socioeconomic History   Marital status: Married    Spouse name: Not on file   Number of children: Not on file   Years of education: Not on file   Highest education level: Not on file  Occupational History   Not on file  Tobacco Use   Smoking status: Never   Smokeless tobacco: Not on file  Substance and Sexual Activity   Alcohol use: No   Drug use: No   Sexual activity: Not on file  Other Topics Concern   Not on file  Social History Narrative   Not on file   Social Determinants of Health   Financial Resource Strain: Not on file  Food Insecurity: Not on file  Transportation Needs: Not on file  Physical Activity: Not on file  Stress: Not on file  Social Connections: Not on file  Intimate Partner Violence: Not on file    Medications:  Current Outpatient Medications on File Prior to Visit  Medication Sig Dispense Refill   Ascorbic Acid (VITAMIN C PO) Take 1 tablet by mouth every Monday, Wednesday, and Friday. Takes along with vit e     aspirin EC 81 MG tablet Take 1 tablet (81 mg total) by mouth daily. Swallow whole. 30 tablet 12   b complex vitamins capsule Take 1 capsule by mouth daily in the afternoon.     Calcium Carb-Cholecalciferol (CALCIUM 600 + D PO) Take 1 tablet by mouth 3 (three) times a week.     Glucosamine HCl (GLUCOSAMINE PO) Take 1 tablet by mouth in the morning and at bedtime.     lisinopril-hydrochlorothiazide (ZESTORETIC) 20-12.5 MG tablet Take 1 tablet by mouth daily.      lisinopril-hydrochlorothiazide (ZESTORETIC) 20-25 MG tablet Take 1 tablet by mouth daily.     Multiple Vitamin (MULTIVITAMIN WITH MINERALS) TABS tablet Take 1 tablet by mouth daily.     Multiple Vitamins-Minerals (PRESERVISION AREDS PO) Take 1 tablet by mouth daily in the afternoon.     Omega-3 Fatty Acids (FISH OIL) 1000 MG CAPS Take 1,000 mg by mouth daily.     Potassium (POTASSIMIN PO) Take 1 tablet by mouth in the morning and at bedtime.     vitamin E 180 MG (400 UNITS) capsule Take 400 Units by mouth every Monday, Wednesday, and Friday.     No current facility-administered medications on file prior to visit.    Allergies:   Allergies  Allergen Reactions   Prednisone Other (See Comments)    Unknown     Physical Exam General: Frail cachectic looking elderly Caucasian male, seated, in no evident distress Head: head normocephalic and atraumatic.   Neck: supple with no carotid or supraclavicular bruits Cardiovascular: regular rate and rhythm, no murmurs Musculoskeletal: Severe kyphoscoliosis with   skin:  no rash/petichiae Vascular:  Normal pulses all extremities  Neurologic Exam Mental Status: Awake and fully alert. Oriented to place and time. Recent and remote memory intact. Attention span, concentration and fund of knowledge appropriate. Mood and affect appropriate.  Cranial Nerves: Fundoscopic exam reveals sharp disc margins. Pupils equal, briskly reactive to light. Extraocular movements full without nystagmus. Visual fields full to confrontation. Hearing intact. Facial sensation intact. Face, tongue, palate moves normally and symmetrically.  Motor: Normal bulk and tone. Normal strength in all tested extremity muscles.  Diminished fine finger movements on the left hand.  Orbits right over left upper extremity. Sensory.: intact to touch , pinprick , position and vibratory sensation.  Coordination: Rapid alternating movements normal in all extremities. Finger-to-nose and  heel-to-shin performed accurately bilaterally. Gait and Station: Arises from chair without difficulty. Stance is normal. Gait slow and cautious.  Stooped.   Reflexes: 1+ and symmetric. Toes downgoing.   NIHSS  0 Modified Rankin  2   ASSESSMENT: 89 year old Caucasian male with right subcortical lacunar infarct in February 2024 from small vessel disease.He has minimal residual left hand weakness but is otherwise doing fine.  Vascular risk factors hypertension, hyperlipidemia and age     PLAN:I had a long d/w patient and his wife about his recent lacunar stroke, risk for recurrent stroke/TIAs, personally independently reviewed imaging studies and stroke evaluation results and answered questions.Continue aspirin 81 mg daily  for secondary stroke prevention and maintain strict control of hypertension with blood pressure goal below 130/90, diabetes with hemoglobin A1c goal below 6.5% and lipids with LDL cholesterol goal below 70 mg/dL. I also advised the patient to eat a healthy  diet with plenty of whole grains, cereals, fruits and vegetables, exercise regularly and maintain ideal body weight I advised him to be compliant with taking his cholesterol medication.  Followup in the future with my nurse practitioner in 4 months or call earlier if necessary.  Greater than 50% time during this 45-minute consultation was spent in counseling and coordination of care about his lacunar stroke and discussion about stroke evaluation, prevention and answering questions.  Delia Heady, MD Note: This document was prepared with digital dictation and possible smart phrase technology. Any transcriptional errors that result from this process are unintentional.

## 2022-09-02 DIAGNOSIS — N50819 Testicular pain, unspecified: Secondary | ICD-10-CM | POA: Diagnosis not present

## 2022-09-02 DIAGNOSIS — M549 Dorsalgia, unspecified: Secondary | ICD-10-CM | POA: Diagnosis not present

## 2022-09-24 DIAGNOSIS — R300 Dysuria: Secondary | ICD-10-CM | POA: Diagnosis not present

## 2022-09-24 DIAGNOSIS — R350 Frequency of micturition: Secondary | ICD-10-CM | POA: Diagnosis not present

## 2022-09-24 DIAGNOSIS — Z8673 Personal history of transient ischemic attack (TIA), and cerebral infarction without residual deficits: Secondary | ICD-10-CM | POA: Diagnosis not present

## 2022-10-31 NOTE — Progress Notes (Signed)
[facility] - Clinic Note  11/05/2022     CHIEF COMPLAINT Patient presents for Retina Follow Up   HISTORY OF PRESENT ILLNESS: [name] is a 89 y.o. male who presents to the clinic today for:   HPI     Retina Follow Up   Patient presents with  Wet AMD.  In both eyes.  This started 8 months ago.  I, the attending physician,  performed the HPI with the patient and updated documentation appropriately.        Comments   Patient here for 8 months retina follow up for exu ARMD OU. Patient states vision doing pretty good. No eye pain.       Last edited by Rennis Chris, MD on 11/06/2022  5:00 PM.    Pt states he is seeing okay, he is not having any "major" problems  Referring physician: Shirlean Mylar, MD [address] [place],  [place] [address]  HISTORICAL INFORMATION:   Selected notes from the MEDICAL RECORD NUMBER Referred by Dr. Elmer Picker for concern of exu ARMD  LEE:  Ocular Hx- PMH-    CURRENT MEDICATIONS: No current outpatient medications on file. (Ophthalmic Drugs)   No current facility-administered medications for this visit. (Ophthalmic Drugs)   Current Outpatient Medications (Other)  Medication Sig   Ascorbic Acid (VITAMIN C PO) Take 1 tablet by mouth every Monday, Wednesday, and Friday. Takes along with vit e   aspirin EC 81 MG tablet Take 1 tablet (81 mg total) by mouth daily. Swallow whole.   b complex vitamins capsule Take 1 capsule by mouth daily in the afternoon.   Calcium Carb-Cholecalciferol (CALCIUM 600 + D PO) Take 1 tablet by mouth 3 (three) times a week.   Glucosamine HCl (GLUCOSAMINE PO) Take 1 tablet by mouth in the morning and at bedtime.   lisinopril-hydrochlorothiazide (ZESTORETIC) 20-12.5 MG tablet Take 1 tablet by mouth daily.   lisinopril-hydrochlorothiazide (ZESTORETIC) 20-25 MG tablet Take 1 tablet by mouth daily.   Multiple Vitamin (MULTIVITAMIN WITH MINERALS) TABS tablet Take 1 tablet by mouth daily.    Multiple Vitamins-Minerals (PRESERVISION AREDS PO) Take 1 tablet by mouth daily in the afternoon.   Omega-3 Fatty Acids (FISH OIL) 1000 MG CAPS Take 1,000 mg by mouth daily.   Potassium (POTASSIMIN PO) Take 1 tablet by mouth in the morning and at bedtime.   vitamin E 180 MG (400 UNITS) capsule Take 400 Units by mouth every Monday, Wednesday, and Friday.   No current facility-administered medications for this visit. (Other)   REVIEW OF SYSTEMS: ROS   Positive for: Eyes Negative for: Constitutional, Gastrointestinal, Neurological, Skin, Genitourinary, Musculoskeletal, HENT, Endocrine, Cardiovascular, Respiratory, Psychiatric, Allergic/Imm, Heme/Lymph Last edited by Laddie Aquas, COA on 11/05/2022  1:49 PM.      ALLERGIES Allergies  Allergen Reactions   Prednisone Other (See Comments)    Unknown    PAST MEDICAL HISTORY Past Medical History:  Diagnosis Date   Hypertension    Macular degeneration    Past Surgical History:  Procedure Laterality Date   CATARACT EXTRACTION     EYE SURGERY     ROTATOR CUFF REPAIR Bilateral    FAMILY HISTORY History reviewed. No pertinent family history.  SOCIAL HISTORY Social History   Tobacco Use   Smoking status: Never  Vaping Use   Vaping Use: Unknown  Substance Use Topics   Alcohol use: No   Drug use: No       OPHTHALMIC EXAM:  Base Eye Exam     Visual Acuity (Snellen - Linear)       Right Left   Dist cc 20/25 -1 20/20 -2   Dist ph cc 20/20 -1     Correction: Glasses         Tonometry (Tonopen, 1:48 PM)       Right Left   Pressure 17 19         Pupils       Dark Light Shape React APD   Right 3 2 Round Brisk None   Left 3 2 Round Brisk None         Visual Fields (Counting fingers)       Left Right    Full Full         Extraocular Movement       Right Left    Full, Ortho Full, Ortho         Neuro/Psych     Oriented x3: Yes   Mood/Affect: Normal         Dilation     Both eyes:  1.0% Mydriacyl, 2.5% Phenylephrine @ 1:48 PM           Slit Lamp and Fundus Exam     Slit Lamp Exam       Right Left   Lids/Lashes Dermatochalasis - upper lid, mild MGD Dermatochalasis - upper lid, mild MGD   Conjunctiva/Sclera nasal pingeucula nasal pingeucula   Cornea arcus, trace Punctate epithelial erosions arcus, trace Punctate epithelial erosions   Anterior Chamber Deep and quiet Deep and quiet   Iris Round and poorly dilated Round and poorly dilated   Lens PC IOL in good position PC IOL in good position   Anterior Vitreous Vitreous syneresis Vitreous syneresis         Fundus Exam       Right Left   Disc mild Pallor, Sharp rim mild Pallor, Sharp rim, +PPA, Thin superior rim   C/D Ratio 0.5 0.6   Macula Flat, Blunted foveal reflex, Drusen, RPE mottling, clumping and mild central atrophy, No heme or edema Flat, Blunted foveal reflex, Drusen, RPE mottling and clumping, central PED, no heme or edema   Vessels attenuated, mild tortuosity attenuated, mild tortuosity   Periphery Attached, mild reticular degeneration, mild peripheral drusen, No heme Attached, mild reticular degeneration, peripheral paving stone degeneration, No heme           Refraction     Wearing Rx       Sphere Cylinder Axis Add   Right -2.25 +3.25 180 +3.00   Left -1.25 +3.25 171 +3.00    Type: Bifocal           IMAGING AND PROCEDURES  Imaging and Procedures for 11/05/2022  OCT, Retina - OU - Both Eyes       Right Eye Quality was good. Central Foveal Thickness: 304. Progression has been stable. Findings include normal foveal contour, no IRF, no SRF, retinal drusen , outer retinal atrophy (Patchy ORA).   Left Eye Quality was good. Central Foveal Thickness: 328. Progression has been stable. Findings include normal foveal contour, no IRF, no SRF, retinal drusen , subretinal hyper-reflective material, pigment epithelial detachment, outer retinal atrophy (Prominent central PEDs with SRHM  between -- stable, patchy ORA).   Notes *Images captured and stored on drive  Diagnosis / Impression:  Non-exu ARMD OU OD: Patchy ORA; no IRF/SRF OS: Prominent central PEDs with SRHM between -- stable, patchy ORA  Clinical management:  See  below  Abbreviations: NFP - Normal foveal profile. CME - cystoid macular edema. PED - pigment epithelial detachment. IRF - intraretinal fluid. SRF - subretinal fluid. EZ - ellipsoid zone. ERM - epiretinal membrane. ORA - outer retinal atrophy. ORT - outer retinal tubulation. SRHM - subretinal hyper-reflective material. IRHM - intraretinal hyper-reflective material             ASSESSMENT/PLAN:    ICD-10-CM   1. Intermediate stage nonexudative age-related macular degeneration of both eyes  H35.3132 OCT, Retina - OU - Both Eyes    2. Essential hypertension  I10     3. Hypertensive retinopathy of both eyes  H35.033     4. Pseudophakia, both eyes  Z96.1       1. Age related macular degeneration, non-exudative, both eyes  - intermediate stage w/ no IRF/SRF OU -- stable - OCT shows OD: Patchy ORA; OS: Central PEDs and patchy ORA  - The incidence, anatomy, and pathology of dry AMD, risk of progression, and the AREDS and AREDS 2 study including smoking risks discussed with patient.  - Recommend amsler grid monitoring  - f/u 6-9 months, DFE, OCT  2,3. Hypertensive retinopathy OU - discussed importance of tight BP control - monitor   4. Pseudophakia OU  - s/p CE/IOL OU  - IOLs in good position, doing well  - monitor   Ophthalmic Meds Ordered this visit:  No orders of the defined types were placed in this encounter.    Return for f/u 6-9 months, non-exu ARMD OU, DFE, OCT.  There are no Patient Instructions on file for this visit.   Explained the diagnoses, plan, and follow up with the patient and they expressed understanding.  Patient expressed understanding of the importance of proper follow up care.   This document serves as a  record of services personally performed by Karie Chimera, MD, PhD. It was created on their behalf by De Blanch, an ophthalmic technician. The creation of this record is the provider's dictation and/or activities during the visit.    Electronically signed by: De Blanch, OA, 11/06/22  5:00 PM  This document serves as a record of services personally performed by Karie Chimera, MD, PhD. It was created on their behalf by Glee Arvin. Manson Passey, OA an ophthalmic technician. The creation of this record is the provider's dictation and/or activities during the visit.    Electronically signed by: Glee Arvin. Manson Passey, OA 11/06/22 5:00 PM   Karie Chimera, M.D., Ph.D. Diseases & Surgery of the Retina and Vitreous [facility]  I have reviewed the above documentation for accuracy and completeness, and I agree with the above. Karie Chimera, M.D., Ph.D. 11/06/22 5:01 PM   Abbreviations: M myopia (nearsighted); A astigmatism; H hyperopia (farsighted); P presbyopia; Mrx spectacle prescription;  CTL contact lenses; OD right eye; OS left eye; OU both eyes  XT exotropia; ET esotropia; PEK punctate epithelial keratitis; PEE punctate epithelial erosions; DES dry eye syndrome; MGD meibomian gland dysfunction; ATs artificial tears; PFAT's preservative free artificial tears; NSC nuclear sclerotic cataract; PSC posterior subcapsular cataract; ERM epi-retinal membrane; PVD posterior vitreous detachment; RD retinal detachment; DM diabetes mellitus; DR diabetic retinopathy; NPDR non-proliferative diabetic retinopathy; PDR proliferative diabetic retinopathy; CSME clinically significant macular edema; DME diabetic macular edema; dbh dot blot hemorrhages; CWS cotton wool spot; POAG primary open angle glaucoma; C/D cup-to-disc ratio; HVF humphrey visual field; GVF goldmann visual field; OCT optical coherence tomography; IOP intraocular pressure; BRVO Branch retinal vein occlusion;  CRVO central  retinal vein occlusion; CRAO central retinal artery occlusion; BRAO branch retinal artery occlusion; RT retinal tear; SB scleral buckle; PPV pars plana vitrectomy; VH Vitreous hemorrhage; PRP panretinal laser photocoagulation; IVK intravitreal kenalog; VMT vitreomacular traction; MH Macular hole;  NVD neovascularization of the disc; NVE neovascularization elsewhere; AREDS age related eye disease study; ARMD age related macular degeneration; POAG primary open angle glaucoma; EBMD epithelial/anterior basement membrane dystrophy; ACIOL anterior chamber intraocular lens; IOL intraocular lens; PCIOL posterior chamber intraocular lens; Phaco/IOL phacoemulsification with intraocular lens placement; [place] photorefractive keratectomy; LASIK laser assisted in situ keratomileusis; HTN hypertension; DM diabetes mellitus; COPD chronic obstructive pulmonary disease

## 2022-11-05 ENCOUNTER — Encounter (INDEPENDENT_AMBULATORY_CARE_PROVIDER_SITE_OTHER): Payer: Self-pay | Admitting: Ophthalmology

## 2022-11-05 ENCOUNTER — Ambulatory Visit (INDEPENDENT_AMBULATORY_CARE_PROVIDER_SITE_OTHER): Payer: Medicare Other | Admitting: Ophthalmology

## 2022-11-05 DIAGNOSIS — Z961 Presence of intraocular lens: Secondary | ICD-10-CM

## 2022-11-05 DIAGNOSIS — H353132 Nonexudative age-related macular degeneration, bilateral, intermediate dry stage: Secondary | ICD-10-CM | POA: Diagnosis not present

## 2022-11-05 DIAGNOSIS — H35033 Hypertensive retinopathy, bilateral: Secondary | ICD-10-CM

## 2022-11-05 DIAGNOSIS — I10 Essential (primary) hypertension: Secondary | ICD-10-CM | POA: Diagnosis not present

## 2022-11-06 ENCOUNTER — Encounter (INDEPENDENT_AMBULATORY_CARE_PROVIDER_SITE_OTHER): Payer: Self-pay | Admitting: Ophthalmology

## 2023-01-20 DIAGNOSIS — K080 Exfoliation of teeth due to systemic causes: Secondary | ICD-10-CM | POA: Diagnosis not present

## 2023-01-20 NOTE — Progress Notes (Unsigned)
[facility] [address] [place] [address] [phone]       OFFICE FOLLOW UP NOTE  [name] Date of Birth:  19-Jun-1932 Medical Record Number:  [id number]    Primary neurologist: Dr. Pearlean Brownie Reason for visit: Stroke follow-up  HPI:   Update 01/21/2023 JM: Patient returns for follow-up visit accompanied by his wife.  Stable without new stroke/TIA symptoms.  Left hand ***.   Compliant on aspirin 81 mg daily.  Advised to initiate Crestor at prior visit but apparently self discontinued due to "hearing too many bad things".  Does remain on omega-3.  Routinely follows with PCP for stroke risk factor management       Consult visit 08/25/2022 Dr. Pearlean Brownie: Mr. [name] is a pleasant 89 year old Caucasian male seen today for initial office consultation visit for stroke.  He is accompanied by his wife.  History is obtained from them and review of electronic medical records.  I personally reviewed pertinent available imaging films in PACS.  He has past medical history significant for hypertension and macular degeneration.  He presented on 06/17/2022 with sudden onset of left hand weakness numbness and facial droop.  He noticed the symptoms that morning upon awakening from sleep but did not seek medical attention.  He presented outside time window for thrombolysis.  CT head was unremarkable but MRI scan showed a right posterior frontal white matter lacunar infarct measuring 1 cm.  CT angiogram of the brain and neck shows no large vessel stenosis or occlusion.  Echocardiogram showed ejection fraction of 60 to 65%.  Hemoglobin A1c was 5.8.  LDL cholesterol was elevated at 145 mg percent.  Patient was on aspirin and was advised to take Plavix for 3 weeks and then switch to Plavix however he has done the opposite and stop Plavix and is on aspirin 81 mg daily alone now.Marland Kitchen  He did well for 3 weeks external cardiac monitor from 06/26/22/7/24 which was negative for paroxysmal A-fib.   He states his left hand numbness has resolved he still has some diminished fine motor skills and mild weakness but he is has been able to use his left hand for more activities now though not to the same extent as before.  He did not get any outpatient physical Occupational Therapy.  He is quite independent wants to go back to do all previous activities.  He has no prior history of strokes, TIAs or significant neurological problems.  Patient was supposed to be on Crestor 10 mg daily but he has not started taking it.  He states he is needing a healthy diet with lots of vegetables and fruits and felt that that may be enough to reduce his cholesterol.  He does take a fish oil 1000 mg capsule daily.  ROS:   14 system review of systems is positive for weakness, numbness and all other systems negative  PMH:  Past Medical History:  Diagnosis Date   Hypertension    Macular degeneration     Social History:  Social History   Socioeconomic History   Marital status: Married    Spouse name: Not on file   Number of children: Not on file   Years of education: Not on file   Highest education level: Not on file  Occupational History   Not on file  Tobacco Use   Smoking status: Never   Smokeless tobacco: Not on file  Vaping Use   Vaping status: Unknown  Substance and Sexual Activity   Alcohol  use: No   Drug use: No   Sexual activity: Not on file  Other Topics Concern   Not on file  Social History Narrative   Not on file   Social Determinants of Health   Financial Resource Strain: Not on file  Food Insecurity: Not on file  Transportation Needs: Not on file  Physical Activity: Not on file  Stress: Not on file  Social Connections: Not on file  Intimate Partner Violence: Not on file    Medications:   Current Outpatient Medications on File Prior to Visit  Medication Sig Dispense Refill   Ascorbic Acid (VITAMIN C PO) Take 1 tablet by mouth every Monday, Wednesday, and Friday. Takes along  with vit e     aspirin EC 81 MG tablet Take 1 tablet (81 mg total) by mouth daily. Swallow whole. 30 tablet 12   b complex vitamins capsule Take 1 capsule by mouth daily in the afternoon.     Calcium Carb-Cholecalciferol (CALCIUM 600 + D PO) Take 1 tablet by mouth 3 (three) times a week.     Glucosamine HCl (GLUCOSAMINE PO) Take 1 tablet by mouth in the morning and at bedtime.     lisinopril-hydrochlorothiazide (ZESTORETIC) 20-12.5 MG tablet Take 1 tablet by mouth daily.     lisinopril-hydrochlorothiazide (ZESTORETIC) 20-25 MG tablet Take 1 tablet by mouth daily.     Multiple Vitamin (MULTIVITAMIN WITH MINERALS) TABS tablet Take 1 tablet by mouth daily.     Multiple Vitamins-Minerals (PRESERVISION AREDS PO) Take 1 tablet by mouth daily in the afternoon.     Omega-3 Fatty Acids (FISH OIL) 1000 MG CAPS Take 1,000 mg by mouth daily.     Potassium (POTASSIMIN PO) Take 1 tablet by mouth in the morning and at bedtime.     vitamin E 180 MG (400 UNITS) capsule Take 400 Units by mouth every Monday, Wednesday, and Friday.     No current facility-administered medications on file prior to visit.    Allergies:   Allergies  Allergen Reactions   Prednisone Other (See Comments)    Unknown     Physical Exam There were no vitals filed for this visit. There is no height or weight on file to calculate BMI.  General: Frail cachectic looking elderly Caucasian male, seated, in no evident distress Head: head normocephalic and atraumatic.   Neck: supple with no carotid or supraclavicular bruits Cardiovascular: regular rate and rhythm, no murmurs Musculoskeletal: Severe kyphoscoliosis with  Neurologic Exam Mental Status: Awake and fully alert. Oriented to place and time. Recent and remote memory intact. Attention span, concentration and fund of knowledge appropriate. Mood and affect appropriate.  Cranial Nerves: Pupils equal, briskly reactive to light. Extraocular movements full without nystagmus. Visual  fields full to confrontation. Hearing intact. Facial sensation intact. Face, tongue, palate moves normally and symmetrically.  Motor: Normal bulk and tone. Normal strength in all tested extremity muscles.  Diminished fine finger movements on the left hand.  Orbits right over left upper extremity. Sensory.: intact to touch , pinprick , position and vibratory sensation.  Coordination: Rapid alternating movements normal in all extremities. Finger-to-nose and heel-to-shin performed accurately bilaterally. Gait and Station: Arises from chair without difficulty. Stance is normal. Gait slow and cautious.  Stooped.   Reflexes: 1+ and symmetric. Toes downgoing.       ASSESSMENT/PLAN: 89 year old Caucasian male with right subcortical lacunar infarct in February 2024 from small vessel disease.He has minimal residual left hand weakness but is otherwise doing fine.  Vascular risk  factors hypertension, hyperlipidemia and age    1.  Right posterior frontal infarct  Continue aspirin 81 mg daily for secondary stroke prevention measures.  Currently taking omega-3 for HLD management, previously advised to start Crestor ***  Continue close follow-up with PCP for stroke risk factor management including BP goal<130/90, and HLD with LDL goal<70        I spent *** minutes of face-to-face and non-face-to-face time with patient.  This included previsit chart review, lab review, study review, order entry, electronic health record documentation, patient education and discussion regarding above diagnoses and treatment plan and answered all other questions to patient's satisfaction  Ihor Austin, [facility]  [facility] [address] [place], [place] [address]  Phone [phone] Fax [phone] Note: This document was prepared with digital dictation and possible smart phrase technology. Any transcriptional errors that result from this process are unintentional.

## 2023-01-21 ENCOUNTER — Ambulatory Visit: Payer: Medicare Other | Admitting: Adult Health

## 2023-01-21 ENCOUNTER — Encounter: Payer: Self-pay | Admitting: Adult Health

## 2023-01-21 VITALS — BP 117/77 | HR 93 | Ht 62.0 in | Wt 121.0 lb

## 2023-01-21 DIAGNOSIS — I639 Cerebral infarction, unspecified: Secondary | ICD-10-CM | POA: Diagnosis not present

## 2023-01-21 NOTE — Patient Instructions (Addendum)
Continue aspirin 81 mg daily  and omega 3 for secondary stroke prevention  Highly recommend starting Crestor as this will help reduce cardiovascular risk including stroke and heart attack   Continue to follow up with PCP regarding blood pressure and cholesterol management  Maintain strict control of hypertension with blood pressure goal below 130/90 and cholesterol with LDL cholesterol (bad cholesterol) goal below 70 mg/dL.   Signs of a Stroke? Follow the BEFAST method:  Balance Watch for a sudden loss of balance, trouble with coordination or vertigo Eyes Is there a sudden loss of vision in one or both eyes? Or double vision?  Face: Ask the person to smile. Does one side of the face droop or is it numb?  Arms: Ask the person to raise both arms. Does one arm drift downward? Is there weakness or numbness of a leg? Speech: Ask the person to repeat a simple phrase. Does the speech sound slurred/strange? Is the person confused ? Time: If you observe any of these signs, call 911.    As you are stable from a stroke standpoint, you can follow up with use as needed at this time. Please call office with any stroke questions or concerns     Thank you for coming to see [place] at [facility]. I hope we have been able to provide you high quality care today.  You may receive a patient satisfaction survey over the next few weeks. We would appreciate your feedback and comments so that we may continue to improve ourselves and the health of our patients.     Stroke Prevention Some medical conditions and behaviors can lead to a higher chance of having a stroke. You can help prevent a stroke by eating healthy, exercising, not smoking, and managing any medical conditions you have. Stroke is a leading cause of functional impairment. Primary prevention is particularly important because a majority of strokes are first-time events. Stroke changes the lives of not only those who experience a  stroke but also their family and other caregivers. How can this condition affect me? A stroke is a medical emergency and should be treated right away. A stroke can lead to brain damage and can sometimes be life-threatening. If a person gets medical treatment right away, there is a better chance of surviving and recovering from a stroke. What can increase my risk? The following medical conditions may increase your risk of a stroke: Cardiovascular disease. High blood pressure (hypertension). Diabetes. High cholesterol. Sickle cell disease. Blood clotting disorders (hypercoagulable state). Obesity. Sleep disorders (obstructive sleep apnea). Other risk factors include: Being older than age 43. Having a history of blood clots, stroke, or mini-stroke (transient ischemic attack, TIA). Genetic factors, such as race, ethnicity, or a family history of stroke. Smoking cigarettes or using other tobacco products. Taking birth control pills, especially if you also use tobacco. Heavy use of alcohol or drugs, especially cocaine and methamphetamine. Physical inactivity. What actions can I take to prevent this? Manage your health conditions High cholesterol levels. Eating a healthy diet is important for preventing high cholesterol. If cholesterol cannot be managed through diet alone, you may need to take medicines. Take any prescribed medicines to control your cholesterol as told by your health care provider. Hypertension. To reduce your risk of stroke, try to keep your blood pressure below 130/80. Eating a healthy diet and exercising regularly are important for controlling blood pressure. If these steps are not enough to manage your blood pressure, you may need to take  medicines. Take any prescribed medicines to control hypertension as told by your health care provider. Ask your health care provider if you should monitor your blood pressure at home. Have your blood pressure checked every year, even if  your blood pressure is normal. Blood pressure increases with age and some medical conditions. Diabetes. Eating a healthy diet and exercising regularly are important parts of managing your blood sugar (glucose). If your blood sugar cannot be managed through diet and exercise, you may need to take medicines. Take any prescribed medicines to control your diabetes as told by your health care provider. Get evaluated for obstructive sleep apnea. Talk to your health care provider about getting a sleep evaluation if you snore a lot or have excessive sleepiness. Make sure that any other medical conditions you have, such as atrial fibrillation or atherosclerosis, are managed. Nutrition Follow instructions from your health care provider about what to eat or drink to help manage your health condition. These instructions may include: Reducing your daily calorie intake. Limiting how much salt (sodium) you use to 1,500 milligrams (mg) each day. Using only healthy fats for cooking, such as olive oil, canola oil, or sunflower oil. Eating healthy foods. You can do this by: Choosing foods that are high in fiber, such as whole grains, and fresh fruits and vegetables. Eating at least 5 servings of fruits and vegetables a day. Try to fill one-half of your plate with fruits and vegetables at each meal. Choosing lean protein foods, such as lean cuts of meat, poultry without skin, fish, tofu, beans, and nuts. Eating low-fat dairy products. Avoiding foods that are high in sodium. This can help lower blood pressure. Avoiding foods that have saturated fat, trans fat, and cholesterol. This can help prevent high cholesterol. Avoiding processed and prepared foods. Counting your daily carbohydrate intake.  Lifestyle If you drink alcohol: Limit how much you have to: 0-1 drink a day for women who are not pregnant. 0-2 drinks a day for men. Know how much alcohol is in your drink. In the [place] one drink equals one 12 oz  bottle of beer ( ), one 5 oz glass of wine ( ), or one 1 oz glass of hard liquor (44mL). Do not use any products that contain nicotine or tobacco. These products include cigarettes, chewing tobacco, and vaping devices, such as e-cigarettes. If you need help quitting, ask your health care provider. Avoid secondhand smoke. Do not use drugs. Activity  Try to stay at a healthy weight. Get at least 30 minutes of exercise on most days, such as: Fast walking. Biking. Swimming. Medicines Take over-the-counter and prescription medicines only as told by your health care provider. Aspirin or blood thinners (antiplatelets or anticoagulants) may be recommended to reduce your risk of forming blood clots that can lead to stroke. Avoid taking birth control pills. Talk to your health care provider about the risks of taking birth control pills if: You are over 14 years old. You smoke. You get very bad headaches. You have had a blood clot. Where to find more information American Stroke Association: www.strokeassociation.org Get help right away if: You or a loved one has any symptoms of a stroke. "BE FAST" is an easy way to remember the main warning signs of a stroke: B - Balance. Signs are dizziness, sudden trouble walking, or loss of balance. E - Eyes. Signs are trouble seeing or a sudden change in vision. F - Face. Signs are sudden weakness or numbness of the face, or the face or  eyelid drooping on one side. A - Arms. Signs are weakness or numbness in an arm. This happens suddenly and usually on one side of the body. S - Speech. Signs are sudden trouble speaking, slurred speech, or trouble understanding what people say. T - Time. Time to call emergency services. Write down what time symptoms started. You or a loved one has other signs of a stroke, such as: A sudden, severe headache with no known cause. Nausea or vomiting. Seizure. These symptoms may represent a serious problem that is an  emergency. Do not wait to see if the symptoms will go away. Get medical help right away. Call your local emergency services (911 in the [place] Do not drive yourself to the hospital. Summary You can help to prevent a stroke by eating healthy, exercising, not smoking, limiting alcohol intake, and managing any medical conditions you may have. Do not use any products that contain nicotine or tobacco. These include cigarettes, chewing tobacco, and vaping devices, such as e-cigarettes. If you need help quitting, ask your health care provider. Remember "BE FAST" for warning signs of a stroke. Get help right away if you or a loved one has any of these signs. This information is not intended to replace advice given to you by your health care provider. Make sure you discuss any questions you have with your health care provider. Document Revised: 03/31/2022 Document Reviewed: 03/31/2022 Elsevier Patient Education  2024 [employer].

## 2023-02-02 ENCOUNTER — Ambulatory Visit: Payer: Medicare Other | Attending: Cardiology | Admitting: Cardiology

## 2023-02-02 ENCOUNTER — Encounter: Payer: Self-pay | Admitting: Cardiology

## 2023-02-02 VITALS — BP 136/86 | HR 87 | Ht 60.0 in | Wt 122.6 lb

## 2023-02-02 DIAGNOSIS — Z8673 Personal history of transient ischemic attack (TIA), and cerebral infarction without residual deficits: Secondary | ICD-10-CM

## 2023-02-02 DIAGNOSIS — I10 Essential (primary) hypertension: Secondary | ICD-10-CM | POA: Diagnosis not present

## 2023-02-02 NOTE — Progress Notes (Signed)
Cardiology Office Note:    Date:  02/02/2023   ID:  [name], DOB 1933/03/03, MRN [id number]  PCP:  Farris Has, MD  Cardiologist:  Thomasene Ripple, DO  Electrophysiologist:  None   Referring MD: Farris Has, MD   " I am doing fine"  History of Present Illness:    [name] is a 89 y.o. male with a hx of with a hx of HTN,, neuropathy hearing impairment, macular degeneration. He reports that several weeks ago he had intermittent elevated blood pressure - unfortunately he did not  call the office with help on this.   The patient reports that he made dietary modifications and the symptoms resolved over the course of four days. He denies any recent hospitalizations.    Past Medical History:  Diagnosis Date   Hypertension    Macular degeneration     Past Surgical History:  Procedure Laterality Date   CATARACT EXTRACTION     EYE SURGERY     ROTATOR CUFF REPAIR Bilateral     Current Medications: Current Meds  Medication Sig   Ascorbic Acid (VITAMIN C PO) Take 1 tablet by mouth every Monday, Wednesday, and Friday. Takes along with vit e   aspirin EC 81 MG tablet Take 1 tablet (81 mg total) by mouth daily. Swallow whole.   b complex vitamins capsule Take 1 capsule by mouth daily in the afternoon.   Calcium Carb-Cholecalciferol (CALCIUM 600 + D PO) Take 1 tablet by mouth 3 (three) times a week.   Glucosamine HCl (GLUCOSAMINE PO) Take 1 tablet by mouth in the morning and at bedtime.   lisinopril-hydrochlorothiazide (ZESTORETIC) 20-12.5 MG tablet Take 1 tablet by mouth daily.   lisinopril-hydrochlorothiazide (ZESTORETIC) 20-25 MG tablet Take 1 tablet by mouth daily.   Multiple Vitamins-Minerals (PRESERVISION AREDS PO) Take 1 tablet by mouth daily in the afternoon.   Omega-3 Fatty Acids (FISH OIL) 1000 MG CAPS Take 1,000 mg by mouth daily.   Potassium (POTASSIMIN PO) Take 1 tablet by mouth in the morning and at bedtime.   vitamin E 180 MG (400 UNITS) capsule Take 400 Units  by mouth every Monday, Wednesday, and Friday.     Allergies:   Prednisone   Social History   Socioeconomic History   Marital status: Married    Spouse name: Not on file   Number of children: Not on file   Years of education: Not on file   Highest education level: Not on file  Occupational History   Not on file  Tobacco Use   Smoking status: Never   Smokeless tobacco: Not on file  Vaping Use   Vaping status: Unknown  Substance and Sexual Activity   Alcohol use: No   Drug use: No   Sexual activity: Not on file  Other Topics Concern   Not on file  Social History Narrative   Not on file   Social Determinants of Health   Financial Resource Strain: Not on file  Food Insecurity: Not on file  Transportation Needs: Not on file  Physical Activity: Not on file  Stress: Not on file  Social Connections: Not on file     Family History: The patient's family history is not on file.  ROS:   Review of Systems  Constitution: Negative for decreased appetite, fever and weight gain.  HENT: Negative for congestion, ear discharge, hoarse voice and sore throat.   Eyes: Negative for discharge, redness, vision loss in right eye and visual halos.  Cardiovascular: Negative  for chest pain, dyspnea on exertion, leg swelling, orthopnea and palpitations.  Respiratory: Negative for cough, hemoptysis, shortness of breath and snoring.   Endocrine: Negative for heat intolerance and polyphagia.  Hematologic/Lymphatic: Negative for bleeding problem. Does not bruise/bleed easily.  Skin: Negative for flushing, nail changes, rash and suspicious lesions.  Musculoskeletal: Negative for arthritis, joint pain, muscle cramps, myalgias, neck pain and stiffness.  Gastrointestinal: Negative for abdominal pain, bowel incontinence, diarrhea and excessive appetite.  Genitourinary: Negative for decreased libido, genital sores and incomplete emptying.  Neurological: Negative for brief paralysis, focal weakness,  headaches and loss of balance.  Psychiatric/Behavioral: Negative for altered mental status, depression and suicidal ideas.  Allergic/Immunologic: Negative for HIV exposure and persistent infections.    EKGs/Labs/Other Studies Reviewed:    The following studies were reviewed today:   EKG:  The ekg ordered today demonstrates   Recent Labs: 06/17/2022: ALT 15; BUN 28; Creatinine, Ser 1.00; Hemoglobin 14.3; Platelets 284; Potassium 3.6; Sodium 141  Recent Lipid Panel    Component Value Date/Time   CHOL 210 (H) 06/18/2022 0620   TRIG 63 06/18/2022 0620   HDL 52 06/18/2022 0620   CHOLHDL 4.0 06/18/2022 0620   VLDL 13 06/18/2022 0620   LDLCALC 145 (H) 06/18/2022 0620    Physical Exam:    VS:  BP 136/86   Pulse 87   Ht 5' (1.524 m)   Wt 122 lb 9.6 oz (55.6 kg)   SpO2 97%   BMI 23.94 kg/m     Wt Readings from Last 3 Encounters:  02/02/23 122 lb 9.6 oz (55.6 kg)  01/21/23 121 lb (54.9 kg)  08/25/22 123 lb (55.8 kg)     GEN: Well nourished, well developed in no acute distress HEENT: Normal NECK: No JVD; No carotid bruits LYMPHATICS: No lymphadenopathy CARDIAC: S1S2 noted,RRR, no murmurs, rubs, gallops RESPIRATORY:  Clear to auscultation without rales, wheezing or rhonchi  ABDOMEN: Soft, non-tender, non-distended, +bowel sounds, no guarding. EXTREMITIES: No edema, No cyanosis, no clubbing MUSCULOSKELETAL:  No deformity  SKIN: Warm and dry NEUROLOGIC:  Alert and oriented x 3, non-focal PSYCHIATRIC:  Normal affect, good insight  ASSESSMENT:    1. History of CVA (cerebrovascular accident)   2. Hypertension, unspecified type    PLAN:     Hypertension Recent episode of elevated blood pressure (200/110) with associated dizziness, which resolved with dietary modifications. Today's blood pressure is 132/84. -Continue current regimen of Lisinopril-Hydrochlorothiazide, one pill in the morning and one at lunch. -Continue daily aspirin.  Hx of CVA Declines use of statin.  Continue with Aspirin.  General Health Maintenance -Return for follow-up in approximately nine months.  The patient is in agreement with the above plan. The patient left the office in stable condition.  The patient will follow up in 9 months or sooner if needed.    Medication Adjustments/Labs and Tests Ordered: Current medicines are reviewed at length with the patient today.  Concerns regarding medicines are outlined above.  Orders Placed This Encounter  Procedures   EKG 12-Lead   No orders of the defined types were placed in this encounter.   Patient Instructions  Medication Instructions:  Your physician recommends that you continue on your current medications as directed. Please refer to the Current Medication list given to you today.  *If you need a refill on your cardiac medications before your next appointment, please call your pharmacy*   Lab Work: None   Testing/Procedures: None   Follow-Up: At [facility], you and your health  needs are our priority.  As part of our continuing mission to provide you with exceptional heart care, we have created designated Provider Care Teams.  These Care Teams include your primary Cardiologist (physician) and Advanced Practice Providers (APPs -  Physician Assistants and Nurse Practitioners) who all work together to provide you with the care you need, when you need it.   Your next appointment:   9 months   Provider:   Thomasene Ripple, DO     Adopting a Healthy Lifestyle.  Know what a healthy weight is for you (roughly BMI <25) and aim to maintain this   Aim for 7+ servings of fruits and vegetables daily   65-80+ fluid ounces of water or unsweet tea for healthy kidneys   Limit to max 1 drink of alcohol per day; avoid smoking/tobacco   Limit animal fats in diet for cholesterol and heart health - choose grass fed whenever available   Avoid highly processed foods, and foods high in saturated/trans fats   Aim for low  stress - take time to unwind and care for your mental health   Aim for 150 min of moderate intensity exercise weekly for heart health, and weights twice weekly for bone health   Aim for 7-9 hours of sleep daily   When it comes to diets, agreement about the perfect plan isnt easy to find, even among the experts. Experts at the [facility] of [employer] developed an idea known as the Healthy Eating Plate. Just imagine a plate divided into logical, healthy portions.   The emphasis is on diet quality:   Load up on vegetables and fruits - one-half of your plate: Aim for color and variety, and remember that potatoes dont count.   Go for whole grains - one-quarter of your plate: Whole wheat, barley, wheat berries, quinoa, oats, brown rice, and foods made with them. If you want pasta, go with whole wheat pasta.   Protein power - one-quarter of your plate: Fish, chicken, beans, and nuts are all healthy, versatile protein sources. Limit red meat.   The diet, however, does go beyond the plate, offering a few other suggestions.   Use healthy plant oils, such as olive, canola, soy, corn, sunflower and peanut. Check the labels, and avoid partially hydrogenated oil, which have unhealthy trans fats.   If youre thirsty, drink water. Coffee and tea are good in moderation, but skip sugary drinks and limit milk and dairy products to one or two daily servings.   The type of carbohydrate in the diet is more important than the amount. Some sources of carbohydrates, such as vegetables, fruits, whole grains, and beans-are healthier than others.   Finally, stay active  Signed, Thomasene Ripple, DO  02/02/2023 9:25 PM    [facility]

## 2023-02-02 NOTE — Patient Instructions (Signed)
Medication Instructions:  Your physician recommends that you continue on your current medications as directed. Please refer to the Current Medication list given to you today.  *If you need a refill on your cardiac medications before your next appointment, please call your pharmacy*   Lab Work: None   Testing/Procedures: None   Follow-Up: At [facility], you and your health needs are our priority.  As part of our continuing mission to provide you with exceptional heart care, we have created designated Provider Care Teams.  These Care Teams include your primary Cardiologist (physician) and Advanced Practice Providers (APPs -  Physician Assistants and Nurse Practitioners) who all work together to provide you with the care you need, when you need it.   Your next appointment:   9 months   Provider:   Thomasene Ripple, DO

## 2023-05-21 DIAGNOSIS — E782 Mixed hyperlipidemia: Secondary | ICD-10-CM | POA: Diagnosis not present

## 2023-05-21 DIAGNOSIS — R7309 Other abnormal glucose: Secondary | ICD-10-CM | POA: Diagnosis not present

## 2023-05-21 DIAGNOSIS — D729 Disorder of white blood cells, unspecified: Secondary | ICD-10-CM | POA: Diagnosis not present

## 2023-05-21 DIAGNOSIS — E559 Vitamin D deficiency, unspecified: Secondary | ICD-10-CM | POA: Diagnosis not present

## 2023-05-25 DIAGNOSIS — H524 Presbyopia: Secondary | ICD-10-CM | POA: Diagnosis not present

## 2023-05-25 DIAGNOSIS — H43813 Vitreous degeneration, bilateral: Secondary | ICD-10-CM | POA: Diagnosis not present

## 2023-05-25 DIAGNOSIS — Z961 Presence of intraocular lens: Secondary | ICD-10-CM | POA: Diagnosis not present

## 2023-05-25 DIAGNOSIS — H04123 Dry eye syndrome of bilateral lacrimal glands: Secondary | ICD-10-CM | POA: Diagnosis not present

## 2023-05-25 DIAGNOSIS — H353231 Exudative age-related macular degeneration, bilateral, with active choroidal neovascularization: Secondary | ICD-10-CM | POA: Diagnosis not present

## 2023-05-26 DIAGNOSIS — I719 Aortic aneurysm of unspecified site, without rupture: Secondary | ICD-10-CM | POA: Diagnosis not present

## 2023-05-26 DIAGNOSIS — E559 Vitamin D deficiency, unspecified: Secondary | ICD-10-CM | POA: Diagnosis not present

## 2023-05-26 DIAGNOSIS — E782 Mixed hyperlipidemia: Secondary | ICD-10-CM | POA: Diagnosis not present

## 2023-05-26 DIAGNOSIS — I10 Essential (primary) hypertension: Secondary | ICD-10-CM | POA: Diagnosis not present

## 2023-05-26 DIAGNOSIS — Z0000 Encounter for general adult medical examination without abnormal findings: Secondary | ICD-10-CM | POA: Diagnosis not present

## 2023-05-28 NOTE — Progress Notes (Signed)
[facility] - Clinic Note  06/03/2023     CHIEF COMPLAINT Patient presents for Retina Follow Up   HISTORY OF PRESENT ILLNESS: [name] is a 89 y.o. male who presents to the clinic today for:   HPI     Retina Follow Up   Patient presents with  Dry AMD.  In both eyes.  This started 7 months ago.  I, the attending physician,  performed the HPI with the patient and updated documentation appropriately.        Comments   Patient here for 7  months retina follow up for non exu ARMD OU. Patient states vision not as good as it was. OS has a little hazy spot in center of vision. OD is better. No eye pain.       Last edited by Rennis Chris, MD on 06/03/2023  5:43 PM.     Pt states about 6 weeks ago, his left eye vision became "hazy"  Referring physician: Mateo Flow, MD [address] [place],  [place] [address]  HISTORICAL INFORMATION:   Selected notes from the MEDICAL RECORD NUMBER Referred by Dr. Elmer Picker for concern of exu ARMD  LEE:  Ocular Hx- PMH-    CURRENT MEDICATIONS: No current outpatient medications on file. (Ophthalmic Drugs)   No current facility-administered medications for this visit. (Ophthalmic Drugs)   Current Outpatient Medications (Other)  Medication Sig   Ascorbic Acid (VITAMIN C PO) Take 1 tablet by mouth every Monday, Wednesday, and Friday. Takes along with vit e   aspirin EC 81 MG tablet Take 1 tablet (81 mg total) by mouth daily. Swallow whole.   b complex vitamins capsule Take 1 capsule by mouth daily in the afternoon.   Calcium Carb-Cholecalciferol (CALCIUM 600 + D PO) Take 1 tablet by mouth 3 (three) times a week.   Glucosamine HCl (GLUCOSAMINE PO) Take 1 tablet by mouth in the morning and at bedtime.   lisinopril-hydrochlorothiazide (ZESTORETIC) 20-12.5 MG tablet Take 1 tablet by mouth daily.   lisinopril-hydrochlorothiazide (ZESTORETIC) 20-25 MG tablet Take 1 tablet by mouth daily.   Multiple Vitamins-Minerals  (PRESERVISION AREDS PO) Take 1 tablet by mouth daily in the afternoon.   Omega-3 Fatty Acids (FISH OIL) 1000 MG CAPS Take 1,000 mg by mouth daily.   Potassium (POTASSIMIN PO) Take 1 tablet by mouth in the morning and at bedtime.   vitamin E 180 MG (400 UNITS) capsule Take 400 Units by mouth every Monday, Wednesday, and Friday.   No current facility-administered medications for this visit. (Other)   REVIEW OF SYSTEMS: ROS   Positive for: Eyes Negative for: Constitutional, Gastrointestinal, Neurological, Skin, Genitourinary, Musculoskeletal, HENT, Endocrine, Cardiovascular, Respiratory, Psychiatric, Allergic/Imm, Heme/Lymph Last edited by Laddie Aquas, COA on 06/03/2023  1:49 PM.     ALLERGIES Allergies  Allergen Reactions   Prednisone Other (See Comments)    Unknown    PAST MEDICAL HISTORY Past Medical History:  Diagnosis Date   Hypertension    Macular degeneration    Past Surgical History:  Procedure Laterality Date   CATARACT EXTRACTION     EYE SURGERY     ROTATOR CUFF REPAIR Bilateral    FAMILY HISTORY History reviewed. No pertinent family history.  SOCIAL HISTORY Social History   Tobacco Use   Smoking status: Never  Vaping Use   Vaping status: Unknown  Substance Use Topics   Alcohol use: No   Drug use: No       OPHTHALMIC EXAM:  Base Eye Exam     Visual Acuity (Snellen - Linear)       Right Left   Dist cc 20/20 -2 20/30   Dist ph cc  NI    Correction: Glasses         Tonometry (Tonopen, 1:46 PM)       Right Left   Pressure 18 17         Pupils       Dark Light Shape React APD   Right 3 2 Round Brisk None   Left 3 2 Round Brisk None         Visual Fields (Counting fingers)       Left Right    Full Full         Extraocular Movement       Right Left    Full, Ortho Full, Ortho         Neuro/Psych     Oriented x3: Yes   Mood/Affect: Normal         Dilation     Both eyes: 1.0% Mydriacyl, 2.5% Phenylephrine @  1:46 PM           Slit Lamp and Fundus Exam     Slit Lamp Exam       Right Left   Lids/Lashes Dermatochalasis - upper lid, mild MGD Dermatochalasis - upper lid, mild MGD   Conjunctiva/Sclera nasal pingeucula nasal pingeucula   Cornea arcus, trace Punctate epithelial erosions arcus, trace Punctate epithelial erosions   Anterior Chamber Deep and quiet Deep and quiet   Iris Round and poorly dilated Round and poorly dilated   Lens PC IOL in good position PC IOL in good position   Anterior Vitreous Vitreous syneresis Vitreous syneresis         Fundus Exam       Right Left   Disc mild Pallor, Sharp rim mild Pallor, Sharp rim, +PPA, Thin superior rim   C/D Ratio 0.5 0.6   Macula Flat, Blunted foveal reflex, Drusen, RPE mottling, clumping and central atrophy, No heme or edema Flat, Blunted foveal reflex, Drusen, RPE mottling and clumping, central PED, central heme and edema   Vessels attenuated, mild tortuosity attenuated, mild tortuosity   Periphery Attached, mild reticular degeneration, mild peripheral drusen, No heme Attached, mild reticular degeneration, peripheral paving stone degeneration, No heme           Refraction     Wearing Rx       Sphere Cylinder Axis Add   Right -2.25 +3.25 180 +3.00   Left -1.25 +3.25 171 +3.00    Type: Bifocal           IMAGING AND PROCEDURES  Imaging and Procedures for 06/03/2023  OCT, Retina - OU - Both Eyes       Right Eye Quality was good. Central Foveal Thickness: 288. Progression has been stable. Findings include normal foveal contour, no IRF, no SRF, retinal drusen , outer retinal atrophy (Patchy ORA).   Left Eye Quality was good. Central Foveal Thickness: 341. Progression has worsened. Findings include normal foveal contour, no IRF, no SRF, retinal drusen , subretinal hyper-reflective material, pigment epithelial detachment, outer retinal atrophy (Prominent central PEDs with [facility] between -- interval development of SRHM and  cystic changes overlying, patchy ORA).   Notes *Images captured and stored on drive  Diagnosis / Impression:  OD: Nonexudative ARMD - Patchy ORA; no IRF/SRF OS: Prominent central PEDs with [facility] between; interval development of SRHM and cystic  changes overlying, patchy ORA -- conversion to exudative ARMD  Clinical management:  See below  Abbreviations: NFP - Normal foveal profile. CME - cystoid macular edema. PED - pigment epithelial detachment. IRF - intraretinal fluid. SRF - subretinal fluid. EZ - ellipsoid zone. ERM - epiretinal membrane. ORA - outer retinal atrophy. ORT - outer retinal tubulation. SRHM - subretinal hyper-reflective material. IRHM - intraretinal hyper-reflective material      Intravitreal Injection, Pharmacologic Agent - OS - Left Eye       Time Out 06/03/2023. 2:53 PM. Confirmed correct patient, procedure, site, and patient consented.   Anesthesia Topical anesthesia was used. Anesthetic medications included Lidocaine 2%, Proparacaine 0.5%.   Procedure Preparation included 5% betadine to ocular surface, eyelid speculum. A supplied needle was used.   Injection: 1.25 mg Bevacizumab 1.25mg /0.55ml   Route: Intravitreal, Site: Left Eye   NDC: [id number], Lot: [id number], Expiration date: 07/25/2023   Post-op Post injection exam found visual acuity of at least counting fingers. The patient tolerated the procedure well. There were no complications. The patient received written and verbal post procedure care education.            ASSESSMENT/PLAN:    ICD-10-CM   1. Exudative age-related macular degeneration of left eye with active choroidal neovascularization (HCC)  H35.3221 OCT, Retina - OU - Both Eyes    Intravitreal Injection, Pharmacologic Agent - OS - Left Eye    Bevacizumab (AVASTIN) SOLN 1.25 mg    2. Intermediate stage nonexudative age-related macular degeneration of right eye  H35.3112 OCT, Retina - OU - Both Eyes    3. Essential hypertension  I10      4. Hypertensive retinopathy of both eyes  H35.033     5. Pseudophakia, both eyes  Z96.1      Exudative age related macular degeneration, left eye   - interval conversion to exu ARMD OS on 01.22.25 exam -- new heme on exam, BCVA OS 20/30 from 20/20  - The incidence pathology and anatomy of wet AMD discussed   - The ANCHOR, MARINA, CATT and VIEW trials discussed with patient.    - discussed treatment options including observation vs intravitreal anti-VEGF agents such as Avastin, Lucentis, Eylea.    - Risks of endophthalmitis and vascular occlusive events and atrophic changes discussed with patient  - OCT shows prominent central PEDs with [facility] between; interval development of SRHM and cystic changes overlying; patchy ORA  - BCVA OS 20/30 from 20/20  - recommend IVA OS #1 today, 01.22.25  - pt wishes to be treated with IVA  - RBA of procedure discussed, questions answered - IVA informed consent obtained and signed, 01.22.25 - see procedure note  - f/u in 4 wks  2. Age related macular degeneration, non-exudative, right eye  - intermediate stage w/ no IRF/SRF OU -- stable - OCT shows OD: Patchy ORA; OS: Central PEDs and patchy ORA  - The incidence, anatomy, and pathology of dry AMD, risk of progression, and the AREDS and AREDS 2 study including smoking risks discussed with patient.  - Recommend amsler grid monitoring  - monitor  3,4. Hypertensive retinopathy OU - discussed importance of tight BP control - monitor   5. Pseudophakia OU  - s/p CE/IOL OU  - IOLs in good position, doing well  - monitor   Ophthalmic Meds Ordered this visit:  Meds ordered this encounter  Medications   Bevacizumab (AVASTIN) SOLN 1.25 mg     Return in about 4 weeks (around 07/01/2023) for f/u  exu ARMD OS, DFE, OCT.  There are no Patient Instructions on file for this visit.   Explained the diagnoses, plan, and follow up with the patient and they expressed understanding.  Patient expressed  understanding of the importance of proper follow up care.   This document serves as a record of services personally performed by Karie Chimera, MD, PhD. It was created on their behalf by Glee Arvin. Manson Passey, OA an ophthalmic technician. The creation of this record is the provider's dictation and/or activities during the visit.    Electronically signed by: Glee Arvin. Manson Passey, OA 06/05/23 5:18 PM  Karie Chimera, M.D., Ph.D. Diseases & Surgery of the Retina and Vitreous [facility]  I have reviewed the above documentation for accuracy and completeness, and I agree with the above. Karie Chimera, M.D., Ph.D. 06/05/23 5:18 PM    Abbreviations: M myopia (nearsighted); A astigmatism; H hyperopia (farsighted); P presbyopia; Mrx spectacle prescription;  CTL contact lenses; OD right eye; OS left eye; OU both eyes  XT exotropia; ET esotropia; PEK punctate epithelial keratitis; PEE punctate epithelial erosions; DES dry eye syndrome; MGD meibomian gland dysfunction; ATs artificial tears; PFAT's preservative free artificial tears; NSC nuclear sclerotic cataract; PSC posterior subcapsular cataract; ERM epi-retinal membrane; PVD posterior vitreous detachment; RD retinal detachment; DM diabetes mellitus; DR diabetic retinopathy; NPDR non-proliferative diabetic retinopathy; PDR proliferative diabetic retinopathy; CSME clinically significant macular edema; DME diabetic macular edema; dbh dot blot hemorrhages; CWS cotton wool spot; POAG primary open angle glaucoma; C/D cup-to-disc ratio; HVF humphrey visual field; GVF goldmann visual field; OCT optical coherence tomography; IOP intraocular pressure; BRVO Branch retinal vein occlusion; CRVO central retinal vein occlusion; CRAO central retinal artery occlusion; BRAO branch retinal artery occlusion; RT retinal tear; SB scleral buckle; PPV pars plana vitrectomy; VH Vitreous hemorrhage; PRP panretinal laser photocoagulation; IVK intravitreal kenalog; VMT  vitreomacular traction; MH Macular hole;  NVD neovascularization of the disc; NVE neovascularization elsewhere; AREDS age related eye disease study; ARMD age related macular degeneration; POAG primary open angle glaucoma; EBMD epithelial/anterior basement membrane dystrophy; ACIOL anterior chamber intraocular lens; IOL intraocular lens; PCIOL posterior chamber intraocular lens; Phaco/IOL phacoemulsification with intraocular lens placement; PRK photorefractive keratectomy; LASIK laser assisted in situ keratomileusis; HTN hypertension; DM diabetes mellitus; COPD chronic obstructive pulmonary disease

## 2023-06-03 ENCOUNTER — Encounter (INDEPENDENT_AMBULATORY_CARE_PROVIDER_SITE_OTHER): Payer: Self-pay | Admitting: Ophthalmology

## 2023-06-03 ENCOUNTER — Ambulatory Visit (INDEPENDENT_AMBULATORY_CARE_PROVIDER_SITE_OTHER): Payer: Medicare Other | Admitting: Ophthalmology

## 2023-06-03 DIAGNOSIS — H353132 Nonexudative age-related macular degeneration, bilateral, intermediate dry stage: Secondary | ICD-10-CM

## 2023-06-03 DIAGNOSIS — Z961 Presence of intraocular lens: Secondary | ICD-10-CM

## 2023-06-03 DIAGNOSIS — H353112 Nonexudative age-related macular degeneration, right eye, intermediate dry stage: Secondary | ICD-10-CM | POA: Diagnosis not present

## 2023-06-03 DIAGNOSIS — H35033 Hypertensive retinopathy, bilateral: Secondary | ICD-10-CM

## 2023-06-03 DIAGNOSIS — H353221 Exudative age-related macular degeneration, left eye, with active choroidal neovascularization: Secondary | ICD-10-CM

## 2023-06-03 DIAGNOSIS — I10 Essential (primary) hypertension: Secondary | ICD-10-CM | POA: Diagnosis not present

## 2023-06-03 MED ORDER — BEVACIZUMAB CHEMO INJECTION 1.25MG/0.05ML SYRINGE FOR KALEIDOSCOPE
1.2500 mg | INTRAVITREAL | Status: AC | PRN
  Administered 2023-06-03: 1.25 mg via INTRAVITREAL

## 2023-06-05 ENCOUNTER — Other Ambulatory Visit: Payer: Self-pay | Admitting: Family Medicine

## 2023-06-05 DIAGNOSIS — I719 Aortic aneurysm of unspecified site, without rupture: Secondary | ICD-10-CM

## 2023-06-23 ENCOUNTER — Ambulatory Visit
Admission: RE | Admit: 2023-06-23 | Discharge: 2023-06-23 | Disposition: A | Discharge status: Home / Self Care | Payer: Medicare Other | Source: Ambulatory Visit | Attending: Family Medicine | Admitting: Family Medicine

## 2023-06-23 DIAGNOSIS — I719 Aortic aneurysm of unspecified site, without rupture: Secondary | ICD-10-CM

## 2023-06-23 MED ORDER — IOPAMIDOL (ISOVUE-370) INJECTION 76%
500.0000 mL | Freq: Once | INTRAVENOUS | Status: AC | PRN
  Administered 2023-06-23: 75 mL via INTRAVENOUS

## 2023-06-23 NOTE — Progress Notes (Shared)
[facility] - Clinic Note  07/01/2023     CHIEF COMPLAINT Patient presents for No chief complaint on file.   HISTORY OF PRESENT ILLNESS: [name] is a 89 y.o. male who presents to the clinic today for:     Pt states about 6 weeks ago, his left eye vision became "hazy"  Referring physician: Mateo Flow, MD [address] [place],  [place] [address]  HISTORICAL INFORMATION:   Selected notes from the MEDICAL RECORD NUMBER Referred by Dr. Elmer Picker for concern of exu ARMD  LEE:  Ocular Hx- PMH-    CURRENT MEDICATIONS: No current outpatient medications on file. (Ophthalmic Drugs)   No current facility-administered medications for this visit. (Ophthalmic Drugs)   Current Outpatient Medications (Other)  Medication Sig   Ascorbic Acid (VITAMIN C PO) Take 1 tablet by mouth every Monday, Wednesday, and Friday. Takes along with vit e   aspirin EC 81 MG tablet Take 1 tablet (81 mg total) by mouth daily. Swallow whole.   b complex vitamins capsule Take 1 capsule by mouth daily in the afternoon.   Calcium Carb-Cholecalciferol (CALCIUM 600 + D PO) Take 1 tablet by mouth 3 (three) times a week.   Glucosamine HCl (GLUCOSAMINE PO) Take 1 tablet by mouth in the morning and at bedtime.   lisinopril-hydrochlorothiazide (ZESTORETIC) 20-12.5 MG tablet Take 1 tablet by mouth daily.   lisinopril-hydrochlorothiazide (ZESTORETIC) 20-25 MG tablet Take 1 tablet by mouth daily.   Multiple Vitamins-Minerals (PRESERVISION AREDS PO) Take 1 tablet by mouth daily in the afternoon.   Omega-3 Fatty Acids (FISH OIL) 1000 MG CAPS Take 1,000 mg by mouth daily.   Potassium (POTASSIMIN PO) Take 1 tablet by mouth in the morning and at bedtime.   vitamin E 180 MG (400 UNITS) capsule Take 400 Units by mouth every Monday, Wednesday, and Friday.   No current facility-administered medications for this visit. (Other)   REVIEW OF SYSTEMS:   ALLERGIES Allergies  Allergen Reactions    Prednisone Other (See Comments)    Unknown    PAST MEDICAL HISTORY Past Medical History:  Diagnosis Date   Hypertension    Macular degeneration    Past Surgical History:  Procedure Laterality Date   CATARACT EXTRACTION     EYE SURGERY     ROTATOR CUFF REPAIR Bilateral    FAMILY HISTORY No family history on file.  SOCIAL HISTORY Social History   Tobacco Use   Smoking status: Never  Vaping Use   Vaping status: Unknown  Substance Use Topics   Alcohol use: No   Drug use: No       OPHTHALMIC EXAM:  Not recorded    IMAGING AND PROCEDURES  Imaging and Procedures for 07/01/2023          ASSESSMENT/PLAN:    ICD-10-CM   1. Exudative age-related macular degeneration of left eye with active choroidal neovascularization (HCC)  H35.3221     2. Intermediate stage nonexudative age-related macular degeneration of right eye  H35.3112     3. Essential hypertension  I10     4. Hypertensive retinopathy of both eyes  H35.033     5. Pseudophakia, both eyes  Z96.1      Exudative age related macular degeneration, left eye    - s/p IVA OS #1 (01.22.25) - interval conversion to exu ARMD OS on 01.22.25 exam -- new heme on exam, BCVA OS 20/30 from 20/20  - OCT shows prominent central PEDs with [facility] between; interval  development of SRHM and cystic changes overlying; patchy ORA  - BCVA OS 20/30 from 20/20  - recommend IVA OS #2 today, 02.19.25  - pt wishes to be treated with IVA  - RBA of procedure discussed, questions answered - IVA informed consent obtained and signed, 01.22.25 - see procedure note  - f/u in 4 wks  2. Age related macular degeneration, non-exudative, right eye  - intermediate stage w/ no IRF/SRF OU -- stable - OCT shows OD: Patchy ORA; OS: Central PEDs and patchy ORA  - The incidence, anatomy, and pathology of dry AMD, risk of progression, and the AREDS and AREDS 2 study including smoking risks discussed with patient.  - Recommend amsler grid monitoring  -  monitor  3,4. Hypertensive retinopathy OU - discussed importance of tight BP control - monitor   5. Pseudophakia OU  - s/p CE/IOL OU  - IOLs in good position, doing well  - monitor   Ophthalmic Meds Ordered this visit:  No orders of the defined types were placed in this encounter.    No follow-ups on file.  There are no Patient Instructions on file for this visit.   Explained the diagnoses, plan, and follow up with the patient and they expressed understanding.  Patient expressed understanding of the importance of proper follow up care.   This document serves as a record of services personally performed by Karie Chimera, MD, PhD. It was created on their behalf by Glee Arvin. Manson Passey, OA an ophthalmic technician. The creation of this record is the provider's dictation and/or activities during the visit.    Electronically signed by: Glee Arvin. Manson Passey, OA 06/23/23 8:22 AM   Karie Chimera, M.D., Ph.D. Diseases & Surgery of the Retina and Vitreous [facility]     Abbreviations: M myopia (nearsighted); A astigmatism; H hyperopia (farsighted); P presbyopia; Mrx spectacle prescription;  CTL contact lenses; OD right eye; OS left eye; OU both eyes  XT exotropia; ET esotropia; PEK punctate epithelial keratitis; PEE punctate epithelial erosions; DES dry eye syndrome; MGD meibomian gland dysfunction; ATs artificial tears; PFAT's preservative free artificial tears; NSC nuclear sclerotic cataract; PSC posterior subcapsular cataract; ERM epi-retinal membrane; PVD posterior vitreous detachment; RD retinal detachment; DM diabetes mellitus; DR diabetic retinopathy; NPDR non-proliferative diabetic retinopathy; PDR proliferative diabetic retinopathy; CSME clinically significant macular edema; DME diabetic macular edema; dbh dot blot hemorrhages; CWS cotton wool spot; POAG primary open angle glaucoma; C/D cup-to-disc ratio; HVF humphrey visual field; GVF goldmann visual field; OCT  optical coherence tomography; IOP intraocular pressure; BRVO Branch retinal vein occlusion; CRVO central retinal vein occlusion; CRAO central retinal artery occlusion; BRAO branch retinal artery occlusion; RT retinal tear; SB scleral buckle; PPV pars plana vitrectomy; VH Vitreous hemorrhage; PRP panretinal laser photocoagulation; IVK intravitreal kenalog; VMT vitreomacular traction; MH Macular hole;  NVD neovascularization of the disc; NVE neovascularization elsewhere; AREDS age related eye disease study; ARMD age related macular degeneration; POAG primary open angle glaucoma; EBMD epithelial/anterior basement membrane dystrophy; ACIOL anterior chamber intraocular lens; IOL intraocular lens; PCIOL posterior chamber intraocular lens; Phaco/IOL phacoemulsification with intraocular lens placement; PRK photorefractive keratectomy; LASIK laser assisted in situ keratomileusis; HTN hypertension; DM diabetes mellitus; COPD chronic obstructive pulmonary disease

## 2023-06-29 NOTE — Progress Notes (Signed)
[facility] - Clinic Note  06/30/2023     CHIEF COMPLAINT Patient presents for Retina Follow Up   HISTORY OF PRESENT ILLNESS: [name] is a 89 y.o. male who presents to the clinic today for:   HPI     Retina Follow Up   Patient presents with  Wet AMD.  In left eye.  This started 4 weeks ago.  I, the attending physician,  performed the HPI with the patient and updated documentation appropriately.        Comments   Patient here for 4 weeks retina follow up for exu ARMD OS. Patient states vision doing pretty good. No eye pain.      Last edited by Rennis Chris, MD on 06/30/2023 12:12 PM.    Pt states   Referring physician: Mateo Flow, MD [address] [place],  [place] [address]  HISTORICAL INFORMATION:   Selected notes from the MEDICAL RECORD NUMBER Referred by Dr. Elmer Picker for concern of exu ARMD  LEE:  Ocular Hx- PMH-    CURRENT MEDICATIONS: No current outpatient medications on file. (Ophthalmic Drugs)   No current facility-administered medications for this visit. (Ophthalmic Drugs)   Current Outpatient Medications (Other)  Medication Sig   Ascorbic Acid (VITAMIN C PO) Take 1 tablet by mouth every Monday, Wednesday, and Friday. Takes along with vit e   aspirin EC 81 MG tablet Take 1 tablet (81 mg total) by mouth daily. Swallow whole.   b complex vitamins capsule Take 1 capsule by mouth daily in the afternoon.   Calcium Carb-Cholecalciferol (CALCIUM 600 + D PO) Take 1 tablet by mouth 3 (three) times a week.   Glucosamine HCl (GLUCOSAMINE PO) Take 1 tablet by mouth in the morning and at bedtime.   lisinopril-hydrochlorothiazide (ZESTORETIC) 20-12.5 MG tablet Take 1 tablet by mouth daily.   lisinopril-hydrochlorothiazide (ZESTORETIC) 20-25 MG tablet Take 1 tablet by mouth daily.   Multiple Vitamins-Minerals (PRESERVISION AREDS PO) Take 1 tablet by mouth daily in the afternoon.   Omega-3 Fatty Acids (FISH OIL) 1000 MG CAPS Take 1,000  mg by mouth daily.   Potassium (POTASSIMIN PO) Take 1 tablet by mouth in the morning and at bedtime.   vitamin E 180 MG (400 UNITS) capsule Take 400 Units by mouth every Monday, Wednesday, and Friday.   No current facility-administered medications for this visit. (Other)   REVIEW OF SYSTEMS: ROS   Positive for: Eyes Negative for: Constitutional, Gastrointestinal, Neurological, Skin, Genitourinary, Musculoskeletal, HENT, Endocrine, Cardiovascular, Respiratory, Psychiatric, Allergic/Imm, Heme/Lymph Last edited by Laddie Aquas, COA on 06/30/2023  9:54 AM.      ALLERGIES Allergies  Allergen Reactions   Prednisone Other (See Comments)    Unknown    PAST MEDICAL HISTORY Past Medical History:  Diagnosis Date   Hypertension    Macular degeneration    Past Surgical History:  Procedure Laterality Date   CATARACT EXTRACTION     EYE SURGERY     ROTATOR CUFF REPAIR Bilateral    FAMILY HISTORY History reviewed. No pertinent family history.  SOCIAL HISTORY Social History   Tobacco Use   Smoking status: Never  Vaping Use   Vaping status: Unknown  Substance Use Topics   Alcohol use: No   Drug use: No       OPHTHALMIC EXAM:  Base Eye Exam     Visual Acuity (Snellen - Linear)       Right Left   Dist cc 20/20 -1 20/30 -2  Correction: Glasses         Tonometry (Tonopen, 9:52 AM)       Right Left   Pressure 19 18         Pupils       Dark Light Shape React APD   Right 3 2 Round Brisk None   Left 3 2 Round Brisk None         Visual Fields (Counting fingers)       Left Right    Full Full         Extraocular Movement       Right Left    Full, Ortho Full, Ortho         Neuro/Psych     Oriented x3: Yes   Mood/Affect: Normal         Dilation     Both eyes: 1.0% Mydriacyl, 2.5% Phenylephrine @ 9:52 AM           Slit Lamp and Fundus Exam     Slit Lamp Exam       Right Left   Lids/Lashes Dermatochalasis - upper lid, mild MGD  Dermatochalasis - upper lid, mild MGD   Conjunctiva/Sclera nasal pingeucula nasal pingeucula   Cornea arcus, trace Punctate epithelial erosions arcus, trace Punctate epithelial erosions   Anterior Chamber Deep and quiet Deep and quiet   Iris Round and poorly dilated Round and poorly dilated   Lens PC IOL in good position PC IOL in good position   Anterior Vitreous Vitreous syneresis Vitreous syneresis         Fundus Exam       Right Left   Disc mild Pallor, Sharp rim mild Pallor, Sharp rim, +PPA, Thin superior rim   C/D Ratio 0.5 0.6   Macula Flat, Blunted foveal reflex, Drusen, RPE mottling, clumping and central atrophy, No heme or edema Flat, Blunted foveal reflex, Drusen, RPE mottling and clumping, central PED, central heme and edema -- improving   Vessels attenuated, mild tortuosity attenuated, mild tortuosity   Periphery Attached, mild reticular degeneration, mild peripheral drusen, No heme Attached, mild reticular degeneration, peripheral paving stone degeneration, No heme           Refraction     Wearing Rx       Sphere Cylinder Axis Add   Right -2.25 +3.25 180 +3.00   Left -1.25 +3.25 171 +3.00    Type: Bifocal           IMAGING AND PROCEDURES  Imaging and Procedures for 06/30/2023  OCT, Retina - OU - Both Eyes       Right Eye Quality was good. Central Foveal Thickness: 284. Progression has been stable. Findings include normal foveal contour, no IRF, no SRF, retinal drusen , outer retinal atrophy (Patchy ORA).   Left Eye Quality was good. Central Foveal Thickness: 341. Progression has improved. Findings include normal foveal contour, no IRF, no SRF, retinal drusen , subretinal hyper-reflective material, pigment epithelial detachment, outer retinal atrophy (Interval improvement in foveal contour, central PEDs and SRHM between PEDs; interval improvement in cystic changes inferior fovea, patchy ORA).   Notes *Images captured and stored on drive  Diagnosis /  Impression:  OD: Nonexudative ARMD - Patchy ORA; no IRF/SRF OS: Interval improvement in foveal contour, central PEDs and SRHM between PEDs; interval improvement in cystic changes inferior fovea, patchy ORA  Clinical management:  See below  Abbreviations: NFP - Normal foveal profile. CME - cystoid macular edema. PED - pigment epithelial detachment. IRF -  intraretinal fluid. SRF - subretinal fluid. EZ - ellipsoid zone. ERM - epiretinal membrane. ORA - outer retinal atrophy. ORT - outer retinal tubulation. SRHM - subretinal hyper-reflective material. IRHM - intraretinal hyper-reflective material      Intravitreal Injection, Pharmacologic Agent - OS - Left Eye       Time Out 06/30/2023. 10:40 AM. Confirmed correct patient, procedure, site, and patient consented.   Anesthesia Topical anesthesia was used. Anesthetic medications included Lidocaine 2%, Proparacaine 0.5%.   Procedure Preparation included 5% betadine to ocular surface, eyelid speculum. A (32g) needle was used.   Injection: 1.25 mg Bevacizumab 1.25mg /0.26ml   Route: Intravitreal, Site: Left Eye   NDC: [id number], Lot: [id number], Expiration date: 07/30/2023   Post-op Post injection exam found visual acuity of at least counting fingers. The patient tolerated the procedure well. There were no complications. The patient received written and verbal post procedure care education.            ASSESSMENT/PLAN:    ICD-10-CM   1. Exudative age-related macular degeneration of left eye with active choroidal neovascularization (HCC)  H35.3221 OCT, Retina - OU - Both Eyes    Intravitreal Injection, Pharmacologic Agent - OS - Left Eye    Bevacizumab (AVASTIN) SOLN 1.25 mg    2. Intermediate stage nonexudative age-related macular degeneration of right eye  H35.3112     3. Essential hypertension  I10     4. Hypertensive retinopathy of both eyes  H35.033     5. Pseudophakia, both eyes  Z96.1      Exudative age related macular  degeneration, left eye    - interval conversion to exu ARMD OS on 01.22.25 exam -- new heme on exam, BCVA OS 20/30 from 20/20 - s/p IVA OS #1 (01.22.25)  - BCVA OS stable at 20/30  - OCT OS shows Interval improvement in foveal contour, central PEDs and SRHM between PEDs; interval improvement in cystic changes inferior fovea, patchy ORA at 4 wks  - recommend IVA OS #2 today, 02.18.25  - pt wishes to be treated with IVA  - RBA of procedure discussed, questions answered - IVA informed consent obtained and signed, 01.22.25 - see procedure note  - f/u in 4 wks -- DFE/OCT, possible injxn  2. Age related macular degeneration, non-exudative, right eye  - intermediate stage w/ no IRF/SRF OU -- stable - OCT shows OD: Patchy ORA; OS: Central PEDs and patchy ORA  - The incidence, anatomy, and pathology of dry AMD, risk of progression, and the AREDS and AREDS 2 study including smoking risks discussed with patient.  - Recommend amsler grid monitoring  - monitor  3,4. Hypertensive retinopathy OU - discussed importance of tight BP control - monitor   5. Pseudophakia OU  - s/p CE/IOL OU  - IOLs in good position, doing well  - monitor   Ophthalmic Meds Ordered this visit:  Meds ordered this encounter  Medications   Bevacizumab (AVASTIN) SOLN 1.25 mg     Return in about 4 weeks (around 07/28/2023) for f/u exu ARMD OS, DFE, OCT, Possible Injxn.  There are no Patient Instructions on file for this visit.   Explained the diagnoses, plan, and follow up with the patient and they expressed understanding.  Patient expressed understanding of the importance of proper follow up care.   This document serves as a record of services personally performed by Karie Chimera, MD, PhD. It was created on their behalf by Charlette Caffey, COT an ophthalmic technician. The creation  of this record is the provider's dictation and/or activities during the visit.    Electronically signed by:  Charlette Caffey,  COT  06/30/23 12:27 PM  This document serves as a record of services personally performed by Karie Chimera, MD, PhD. It was created on their behalf by Glee Arvin. Manson Passey, OA an ophthalmic technician. The creation of this record is the provider's dictation and/or activities during the visit.    Electronically signed by: Glee Arvin. Manson Passey, OA 06/30/23 12:27 PM  Karie Chimera, M.D., Ph.D. Diseases & Surgery of the Retina and Vitreous [facility]  I have reviewed the above documentation for accuracy and completeness, and I agree with the above. Karie Chimera, M.D., Ph.D. 06/30/23 12:27 PM  Abbreviations: M myopia (nearsighted); A astigmatism; H hyperopia (farsighted); P presbyopia; Mrx spectacle prescription;  CTL contact lenses; OD right eye; OS left eye; OU both eyes  XT exotropia; ET esotropia; PEK punctate epithelial keratitis; PEE punctate epithelial erosions; DES dry eye syndrome; MGD meibomian gland dysfunction; ATs artificial tears; PFAT's preservative free artificial tears; NSC nuclear sclerotic cataract; PSC posterior subcapsular cataract; ERM epi-retinal membrane; PVD posterior vitreous detachment; RD retinal detachment; DM diabetes mellitus; DR diabetic retinopathy; NPDR non-proliferative diabetic retinopathy; PDR proliferative diabetic retinopathy; CSME clinically significant macular edema; DME diabetic macular edema; dbh dot blot hemorrhages; CWS cotton wool spot; POAG primary open angle glaucoma; C/D cup-to-disc ratio; HVF humphrey visual field; GVF goldmann visual field; OCT optical coherence tomography; IOP intraocular pressure; BRVO Branch retinal vein occlusion; CRVO central retinal vein occlusion; CRAO central retinal artery occlusion; BRAO branch retinal artery occlusion; RT retinal tear; SB scleral buckle; PPV pars plana vitrectomy; VH Vitreous hemorrhage; PRP panretinal laser photocoagulation; IVK intravitreal kenalog; VMT vitreomacular traction; MH Macular  hole;  NVD neovascularization of the disc; NVE neovascularization elsewhere; AREDS age related eye disease study; ARMD age related macular degeneration; POAG primary open angle glaucoma; EBMD epithelial/anterior basement membrane dystrophy; ACIOL anterior chamber intraocular lens; IOL intraocular lens; PCIOL posterior chamber intraocular lens; Phaco/IOL phacoemulsification with intraocular lens placement; PRK photorefractive keratectomy; LASIK laser assisted in situ keratomileusis; HTN hypertension; DM diabetes mellitus; COPD chronic obstructive pulmonary disease

## 2023-06-30 ENCOUNTER — Encounter (INDEPENDENT_AMBULATORY_CARE_PROVIDER_SITE_OTHER): Payer: Self-pay | Admitting: Ophthalmology

## 2023-06-30 ENCOUNTER — Ambulatory Visit (INDEPENDENT_AMBULATORY_CARE_PROVIDER_SITE_OTHER): Payer: Medicare Other | Admitting: Ophthalmology

## 2023-06-30 DIAGNOSIS — H353112 Nonexudative age-related macular degeneration, right eye, intermediate dry stage: Secondary | ICD-10-CM

## 2023-06-30 DIAGNOSIS — H353221 Exudative age-related macular degeneration, left eye, with active choroidal neovascularization: Secondary | ICD-10-CM | POA: Diagnosis not present

## 2023-06-30 DIAGNOSIS — H35033 Hypertensive retinopathy, bilateral: Secondary | ICD-10-CM

## 2023-06-30 DIAGNOSIS — Z961 Presence of intraocular lens: Secondary | ICD-10-CM

## 2023-06-30 DIAGNOSIS — I10 Essential (primary) hypertension: Secondary | ICD-10-CM

## 2023-06-30 MED ORDER — BEVACIZUMAB CHEMO INJECTION 1.25MG/0.05ML SYRINGE FOR KALEIDOSCOPE
1.2500 mg | INTRAVITREAL | Status: AC | PRN
  Administered 2023-06-30: 1.25 mg via INTRAVITREAL

## 2023-07-01 ENCOUNTER — Encounter (INDEPENDENT_AMBULATORY_CARE_PROVIDER_SITE_OTHER): Payer: Medicare Other | Admitting: Ophthalmology

## 2023-07-01 DIAGNOSIS — H353112 Nonexudative age-related macular degeneration, right eye, intermediate dry stage: Secondary | ICD-10-CM

## 2023-07-01 DIAGNOSIS — H35033 Hypertensive retinopathy, bilateral: Secondary | ICD-10-CM

## 2023-07-01 DIAGNOSIS — Z961 Presence of intraocular lens: Secondary | ICD-10-CM

## 2023-07-01 DIAGNOSIS — I10 Essential (primary) hypertension: Secondary | ICD-10-CM

## 2023-07-01 DIAGNOSIS — H353221 Exudative age-related macular degeneration, left eye, with active choroidal neovascularization: Secondary | ICD-10-CM

## 2023-07-14 NOTE — Progress Notes (Signed)
 [facility] - Clinic Note  07/28/2023     CHIEF COMPLAINT Patient presents for Retina Follow Up   HISTORY OF PRESENT ILLNESS: [name] is a 89 y.o. male who presents to the clinic today for:   HPI     Retina Follow Up   Patient presents with  Wet AMD.  In both eyes.  This started 4 weeks ago.  Duration of 4 weeks.  Since onset it is stable.  I, the attending physician,  performed the HPI with the patient and updated documentation appropriately.        Comments   4 week retina follow up AMD OS and IVA OS pt is reporting no vision changes noticed he denies any flashes has some floaters       Last edited by Rennis Chris, MD on 07/28/2023  5:20 PM.      Referring physician: Mateo Flow, MD [address] [place],  [place] [address]  HISTORICAL INFORMATION:   Selected notes from the MEDICAL RECORD NUMBER Referred by Dr. Elmer Picker for concern of exu ARMD  LEE:  Ocular Hx- PMH-    CURRENT MEDICATIONS: No current outpatient medications on file. (Ophthalmic Drugs)   No current facility-administered medications for this visit. (Ophthalmic Drugs)   Current Outpatient Medications (Other)  Medication Sig   Ascorbic Acid (VITAMIN C PO) Take 1 tablet by mouth every Monday, Wednesday, and Friday. Takes along with vit e   aspirin EC 81 MG tablet Take 1 tablet (81 mg total) by mouth daily. Swallow whole.   b complex vitamins capsule Take 1 capsule by mouth daily in the afternoon.   Calcium Carb-Cholecalciferol (CALCIUM 600 + D PO) Take 1 tablet by mouth 3 (three) times a week.   Glucosamine HCl (GLUCOSAMINE PO) Take 1 tablet by mouth in the morning and at bedtime.   lisinopril-hydrochlorothiazide (ZESTORETIC) 20-12.5 MG tablet Take 1 tablet by mouth daily.   lisinopril-hydrochlorothiazide (ZESTORETIC) 20-25 MG tablet Take 1 tablet by mouth daily.   Multiple Vitamins-Minerals (PRESERVISION AREDS PO) Take 1 tablet by mouth daily in the afternoon.    Omega-3 Fatty Acids (FISH OIL) 1000 MG CAPS Take 1,000 mg by mouth daily.   Potassium (POTASSIMIN PO) Take 1 tablet by mouth in the morning and at bedtime.   vitamin E 180 MG (400 UNITS) capsule Take 400 Units by mouth every Monday, Wednesday, and Friday.   No current facility-administered medications for this visit. (Other)   REVIEW OF SYSTEMS: ROS   Positive for: Eyes Negative for: Constitutional, Gastrointestinal, Neurological, Skin, Genitourinary, Musculoskeletal, HENT, Endocrine, Cardiovascular, Respiratory, Psychiatric, Allergic/Imm, Heme/Lymph Last edited by Etheleen Mayhew, COT on 07/28/2023  1:54 PM.       ALLERGIES Allergies  Allergen Reactions   Prednisone Other (See Comments)    Unknown    PAST MEDICAL HISTORY Past Medical History:  Diagnosis Date   Hypertension    Macular degeneration    Past Surgical History:  Procedure Laterality Date   CATARACT EXTRACTION     EYE SURGERY     ROTATOR CUFF REPAIR Bilateral    FAMILY HISTORY History reviewed. No pertinent family history.  SOCIAL HISTORY Social History   Tobacco Use   Smoking status: Never  Vaping Use   Vaping status: Unknown  Substance Use Topics   Alcohol use: No   Drug use: No       OPHTHALMIC EXAM:  Base Eye Exam     Visual Acuity (Snellen - Linear)  Right Left   Dist cc 20/20 -2 20/40 -2   Dist ph cc NI NI    Correction: Glasses         Tonometry (Tonopen, 1:57 PM)       Right Left   Pressure 18 18         Pupils       Pupils Dark Light Shape React APD   Right PERRL 3 2 Round Brisk None   Left PERRL 3 2 Round Brisk None         Visual Fields       Left Right    Full Full         Extraocular Movement       Right Left    Full, Ortho Full, Ortho         Neuro/Psych     Oriented x3: Yes   Mood/Affect: Normal         Dilation     Both eyes: 2.5% Phenylephrine @ 1:57 PM           Slit Lamp and Fundus Exam     Slit Lamp Exam        Right Left   Lids/Lashes Dermatochalasis - upper lid, mild MGD Dermatochalasis - upper lid, mild MGD   Conjunctiva/Sclera nasal pingeucula nasal pingeucula   Cornea arcus, trace Punctate epithelial erosions arcus, trace Punctate epithelial erosions   Anterior Chamber Deep and quiet Deep and quiet   Iris Round and poorly dilated Round and poorly dilated   Lens PC IOL in good position PC IOL in good position   Anterior Vitreous Vitreous syneresis Vitreous syneresis         Fundus Exam       Right Left   Disc mild Pallor, Sharp rim mild Pallor, Sharp rim, +PPA, Thin superior rim   C/D Ratio 0.5 0.6   Macula Flat, Blunted foveal reflex, Drusen, RPE mottling, clumping and central atrophy, No heme or edema Flat, Blunted foveal reflex, Drusen, RPE mottling and clumping, central PED--improved, central heme, peristent cystic changes slightly worse and edema --slightly increased   Vessels attenuated, mild tortuosity attenuated, mild tortuosity   Periphery Attached, mild reticular degeneration, mild peripheral drusen, No heme Attached, mild reticular degeneration, peripheral paving stone degeneration, No heme           Refraction     Wearing Rx       Sphere Cylinder Axis Add   Right -2.25 +3.25 180 +3.00   Left -1.25 +3.25 171 +3.00    Type: Bifocal           IMAGING AND PROCEDURES  Imaging and Procedures for 07/28/2023  OCT, Retina - OU - Both Eyes       Right Eye Quality was good. Central Foveal Thickness: 292. Progression has been stable. Findings include normal foveal contour, no IRF, no SRF, retinal drusen , outer retinal atrophy (Patchy ORA, focal cystic changes centrally).   Left Eye Quality was good. Central Foveal Thickness: 322. Progression has worsened. Findings include normal foveal contour, no SRF, retinal drusen , subretinal hyper-reflective material, intraretinal fluid, pigment epithelial detachment, outer retinal atrophy (Interval improvement in central PEDs  and SRHM; interval development of large central cyst with persistent cystic changes surrounding, patchy ORA).   Notes *Images captured and stored on drive  Diagnosis / Impression:  OD: Nonexudative ARMD - Patchy ORA; no IRF/SRF; focal cystic changes centrally OS: Interval improvement in central PEDs and SRHM; interval development of large central  cyst with persistent cystic changes surrounding, patchy ORA  Clinical management:  See below  Abbreviations: NFP - Normal foveal profile. CME - cystoid macular edema. PED - pigment epithelial detachment. IRF - intraretinal fluid. SRF - subretinal fluid. EZ - ellipsoid zone. ERM - epiretinal membrane. ORA - outer retinal atrophy. ORT - outer retinal tubulation. SRHM - subretinal hyper-reflective material. IRHM - intraretinal hyper-reflective material      Intravitreal Injection, Pharmacologic Agent - OS - Left Eye       Time Out 07/28/2023. 3:28 PM. Confirmed correct patient, procedure, site, and patient consented.   Anesthesia Topical anesthesia was used. Anesthetic medications included Lidocaine 2%, Proparacaine 0.5%.   Procedure Preparation included 5% betadine to ocular surface, eyelid speculum. A supplied needle was used.   Injection: 1.25 mg Bevacizumab 1.25mg /0.78ml   Route: Intravitreal, Site: Left Eye   NDC: 16109-604-54, Lot: [id number] , Expiration date: 08/17/2023   Post-op Post injection exam found visual acuity of at least counting fingers. The patient tolerated the procedure well. There were no complications. The patient received written and verbal post procedure care education.             ASSESSMENT/PLAN:    ICD-10-CM   1. Exudative age-related macular degeneration of left eye with active choroidal neovascularization (HCC)  H35.3221 OCT, Retina - OU - Both Eyes    Intravitreal Injection, Pharmacologic Agent - OS - Left Eye    Bevacizumab (AVASTIN) SOLN 1.25 mg    CANCELED: Intravitreal Injection, Pharmacologic  Agent - OD - Right Eye    2. Intermediate stage nonexudative age-related macular degeneration of right eye  H35.3112     3. Essential hypertension  I10     4. Hypertensive retinopathy of both eyes  H35.033     5. Pseudophakia, both eyes  Z96.1      Exudative age related macular degeneration, left eye    - interval conversion to exu ARMD OS on 01.22.25 exam -- new heme on exam, BCVA OS 20/30 from 20/20 - s/p IVA OS #1 (01.22.25), #2 (02.18.25)  - today BCVA OS 20/40 from 20/30  - OCT OS shows interval improvement in central PEDs and SRHM; interval development of large central cyst with persistent cystic changes surrounding, patchy ORA at 4 wks **discussed decreased efficacy / resistance to Avastin and potential benefit of switching medication to Eylea**   - recommend IVA OS #3 today, 03.18.25  - pt wishes to be treated with IVA  - RBA of procedure discussed, questions answered - IVA informed consent obtained and signed, 01.22.25, - see procedure note - will check Eylea authorization for next visit  - f/u in 4 wks -- DFE/OCT, possible injxn  2. Age related macular degeneration, non-exudative, right eye  - intermediate stage w/ no IRF/SRF OU -- stable - OCT shows OD: Patchy ORA; OS: Central PEDs and patchy ORA; focal cystic changes centrally  - The incidence, anatomy, and pathology of dry AMD, risk of progression, and the AREDS and AREDS 2 study including smoking risks discussed with patient.  - Recommend amsler grid monitoring  - monitor  3,4. Hypertensive retinopathy OU - discussed importance of tight BP control - monitor   5. Pseudophakia OU  - s/p CE/IOL OU  - IOLs in good position, doing well  - monitor   Ophthalmic Meds Ordered this visit:  Meds ordered this encounter  Medications   Bevacizumab (AVASTIN) SOLN 1.25 mg     Return in about 4 weeks (around 08/25/2023) for ex ARMD OS,  Dilated Exam, OCT, Possible Injxn.  There are no Patient Instructions on file for this  visit.   Explained the diagnoses, plan, and follow up with the patient and they expressed understanding.  Patient expressed understanding of the importance of proper follow up care.   This document serves as a record of services personally performed by Karie Chimera, MD, PhD. It was created on their behalf by Charlette Caffey, COT an ophthalmic technician. The creation of this record is the provider's dictation and/or activities during the visit.    Electronically signed by:  Charlette Caffey, COT  07/28/23 5:24 PM  This document serves as a record of services personally performed by Karie Chimera, MD, PhD. It was created on their behalf by Glee Arvin. Manson Passey, OA an ophthalmic technician. The creation of this record is the provider's dictation and/or activities during the visit.    Electronically signed by: Glee Arvin. Manson Passey, OA 07/28/23 5:24 PM  Karie Chimera, M.D., Ph.D. Diseases & Surgery of the Retina and Vitreous [facility]  I have reviewed the above documentation for accuracy and completeness, and I agree with the above. Karie Chimera, M.D., Ph.D. 07/28/23 5:26 PM   Abbreviations: M myopia (nearsighted); A astigmatism; H hyperopia (farsighted); P presbyopia; Mrx spectacle prescription;  CTL contact lenses; OD right eye; OS left eye; OU both eyes  XT exotropia; ET esotropia; PEK punctate epithelial keratitis; PEE punctate epithelial erosions; DES dry eye syndrome; MGD meibomian gland dysfunction; ATs artificial tears; PFAT's preservative free artificial tears; NSC nuclear sclerotic cataract; PSC posterior subcapsular cataract; ERM epi-retinal membrane; PVD posterior vitreous detachment; RD retinal detachment; DM diabetes mellitus; DR diabetic retinopathy; NPDR non-proliferative diabetic retinopathy; PDR proliferative diabetic retinopathy; CSME clinically significant macular edema; DME diabetic macular edema; dbh dot blot hemorrhages; CWS cotton wool spot;  POAG primary open angle glaucoma; C/D cup-to-disc ratio; HVF humphrey visual field; GVF goldmann visual field; OCT optical coherence tomography; IOP intraocular pressure; BRVO Branch retinal vein occlusion; CRVO central retinal vein occlusion; CRAO central retinal artery occlusion; BRAO branch retinal artery occlusion; RT retinal tear; SB scleral buckle; PPV pars plana vitrectomy; VH Vitreous hemorrhage; PRP panretinal laser photocoagulation; IVK intravitreal kenalog; VMT vitreomacular traction; MH Macular hole;  NVD neovascularization of the disc; NVE neovascularization elsewhere; AREDS age related eye disease study; ARMD age related macular degeneration; POAG primary open angle glaucoma; EBMD epithelial/anterior basement membrane dystrophy; ACIOL anterior chamber intraocular lens; IOL intraocular lens; PCIOL posterior chamber intraocular lens; Phaco/IOL phacoemulsification with intraocular lens placement; PRK photorefractive keratectomy; LASIK laser assisted in situ keratomileusis; HTN hypertension; DM diabetes mellitus; COPD chronic obstructive pulmonary disease

## 2023-07-28 ENCOUNTER — Encounter (INDEPENDENT_AMBULATORY_CARE_PROVIDER_SITE_OTHER): Payer: Self-pay | Admitting: Ophthalmology

## 2023-07-28 ENCOUNTER — Ambulatory Visit (INDEPENDENT_AMBULATORY_CARE_PROVIDER_SITE_OTHER): Payer: Medicare Other | Admitting: Ophthalmology

## 2023-07-28 DIAGNOSIS — I10 Essential (primary) hypertension: Secondary | ICD-10-CM | POA: Diagnosis not present

## 2023-07-28 DIAGNOSIS — H353221 Exudative age-related macular degeneration, left eye, with active choroidal neovascularization: Secondary | ICD-10-CM | POA: Diagnosis not present

## 2023-07-28 DIAGNOSIS — Z961 Presence of intraocular lens: Secondary | ICD-10-CM

## 2023-07-28 DIAGNOSIS — H353112 Nonexudative age-related macular degeneration, right eye, intermediate dry stage: Secondary | ICD-10-CM | POA: Diagnosis not present

## 2023-07-28 DIAGNOSIS — H35033 Hypertensive retinopathy, bilateral: Secondary | ICD-10-CM | POA: Diagnosis not present

## 2023-07-28 MED ORDER — BEVACIZUMAB CHEMO INJECTION 1.25MG/0.05ML SYRINGE FOR KALEIDOSCOPE
1.2500 mg | INTRAVITREAL | Status: AC | PRN
  Administered 2023-07-28: 1.25 mg via INTRAVITREAL

## 2023-08-17 NOTE — Progress Notes (Signed)
 [facility] - Clinic Note  08/25/2023     CHIEF COMPLAINT Patient presents for Retina Follow Up   HISTORY OF PRESENT ILLNESS: [name] is a 89 y.o. male who presents to the clinic today for:   HPI     Retina Follow Up   Patient presents with  Wet AMD.  In left eye.  This started 4 weeks ago.  I, the attending physician,  performed the HPI with the patient and updated documentation appropriately.        Comments   Patient here for 4 weeks retina follow up for exu ARMD OS. Patient states vision doing a little better. No eye pain.      Last edited by Geneieve Duell, MD on 08/25/2023  4:53 PM.    Pt states he is having to use his glasses more often right now, he doesn't want to wear them all the time, but he needs them to read   Referring physician: Amedeo Jupiter, MD [address] [place],  [place] [address]  HISTORICAL INFORMATION:   Selected notes from the MEDICAL RECORD NUMBER Referred by Dr. Lasandra Points for concern of exu ARMD  LEE:  Ocular Hx- PMH-    CURRENT MEDICATIONS: No current outpatient medications on file. (Ophthalmic Drugs)   No current facility-administered medications for this visit. (Ophthalmic Drugs)   Current Outpatient Medications (Other)  Medication Sig   Ascorbic Acid (VITAMIN C PO) Take 1 tablet by mouth every Monday, Wednesday, and Friday. Takes along with vit e   aspirin EC 81 MG tablet Take 1 tablet (81 mg total) by mouth daily. Swallow whole.   b complex vitamins capsule Take 1 capsule by mouth daily in the afternoon.   Calcium Carb-Cholecalciferol (CALCIUM 600 + D PO) Take 1 tablet by mouth 3 (three) times a week.   Glucosamine HCl (GLUCOSAMINE PO) Take 1 tablet by mouth in the morning and at bedtime.   lisinopril-hydrochlorothiazide (ZESTORETIC) 20-12.5 MG tablet Take 1 tablet by mouth daily.   lisinopril-hydrochlorothiazide (ZESTORETIC) 20-25 MG tablet Take 1 tablet by mouth daily.   Multiple Vitamins-Minerals  (PRESERVISION AREDS PO) Take 1 tablet by mouth daily in the afternoon.   Omega-3 Fatty Acids (FISH OIL) 1000 MG CAPS Take 1,000 mg by mouth daily.   Potassium (POTASSIMIN PO) Take 1 tablet by mouth in the morning and at bedtime.   vitamin E 180 MG (400 UNITS) capsule Take 400 Units by mouth every Monday, Wednesday, and Friday.   No current facility-administered medications for this visit. (Other)   REVIEW OF SYSTEMS: ROS   Positive for: Eyes Negative for: Constitutional, Gastrointestinal, Neurological, Skin, Genitourinary, Musculoskeletal, HENT, Endocrine, Cardiovascular, Respiratory, Psychiatric, Allergic/Imm, Heme/Lymph Last edited by Sylvan Evener, COA on 08/25/2023  1:49 PM.        ALLERGIES Allergies  Allergen Reactions   Prednisone Other (See Comments)    Unknown    PAST MEDICAL HISTORY Past Medical History:  Diagnosis Date   Hypertension    Macular degeneration    Past Surgical History:  Procedure Laterality Date   CATARACT EXTRACTION     EYE SURGERY     ROTATOR CUFF REPAIR Bilateral    FAMILY HISTORY History reviewed. No pertinent family history.  SOCIAL HISTORY Social History   Tobacco Use   Smoking status: Never  Vaping Use   Vaping status: Unknown  Substance Use Topics   Alcohol use: No   Drug use: No       OPHTHALMIC EXAM:  Base Eye Exam     Visual Acuity (Snellen - Linear)       Right Left   Dist cc 20/20 -1 20/40    Correction: Glasses         Tonometry (Tonopen, 1:47 PM)       Right Left   Pressure 21 19         Pupils       Dark Light Shape React APD   Right 3 2 Round Brisk None   Left 3 2 Round Brisk None         Visual Fields (Counting fingers)       Left Right    Full Full         Extraocular Movement       Right Left    Full, Ortho Full, Ortho         Neuro/Psych     Oriented x3: Yes   Mood/Affect: Normal         Dilation     Both eyes: 1.0% Mydriacyl, 2.5% Phenylephrine @ 1:47 PM            Slit Lamp and Fundus Exam     Slit Lamp Exam       Right Left   Lids/Lashes Dermatochalasis - upper lid, mild MGD Dermatochalasis - upper lid, mild MGD   Conjunctiva/Sclera nasal pingeucula nasal pingeucula   Cornea arcus, trace Punctate epithelial erosions arcus, trace Punctate epithelial erosions   Anterior Chamber Deep and quiet Deep and quiet   Iris Round and poorly dilated Round and poorly dilated   Lens PC IOL in good position PC IOL in good position   Anterior Vitreous Vitreous syneresis Vitreous syneresis         Fundus Exam       Right Left   Disc mild Pallor, Sharp rim mild Pallor, Sharp rim, +PPA, Thin superior rim   C/D Ratio 0.5 0.6   Macula Flat, Blunted foveal reflex, Drusen, RPE mottling, clumping and central atrophy, No heme or edema Flat, Blunted foveal reflex, Drusen, RPE mottling and clumping, central PED and heme --improved, central cystic changes and edema -- improved   Vessels attenuated, mild tortuosity attenuated, mild tortuosity   Periphery Attached, mild reticular degeneration, mild peripheral drusen, No heme Attached, mild reticular degeneration, peripheral paving stone degeneration, No heme           Refraction     Wearing Rx       Sphere Cylinder Axis Add   Right -2.25 +3.25 180 +3.00   Left -1.25 +3.25 171 +3.00    Type: Bifocal           IMAGING AND PROCEDURES  Imaging and Procedures for 08/25/2023  OCT, Retina - OU - Both Eyes       Right Eye Quality was good. Central Foveal Thickness: 285. Progression has been stable. Findings include normal foveal contour, no IRF, no SRF, retinal drusen , outer retinal atrophy (Patchy ORA, focal cystic changes centrally).   Left Eye Quality was good. Central Foveal Thickness: 276. Progression has improved. Findings include normal foveal contour, no SRF, retinal drusen , subretinal hyper-reflective material, intraretinal fluid, pigment epithelial detachment, outer retinal atrophy  (Stable improvement in central PEDs and SRHM; interval improvement in large central cyst with persistent cystic changes surrounding, patchy ORA).   Notes *Images captured and stored on drive  Diagnosis / Impression:  OD: Nonexudative ARMD - Patchy ORA; no IRF/SRF; focal cystic changes centrally OS: Stable improvement  in central PEDs and SRHM; interval improvement in large central cyst with persistent cystic changes surrounding, patchy ORA  Clinical management:  See below  Abbreviations: NFP - Normal foveal profile. CME - cystoid macular edema. PED - pigment epithelial detachment. IRF - intraretinal fluid. SRF - subretinal fluid. EZ - ellipsoid zone. ERM - epiretinal membrane. ORA - outer retinal atrophy. ORT - outer retinal tubulation. SRHM - subretinal hyper-reflective material. IRHM - intraretinal hyper-reflective material      Intravitreal Injection, Pharmacologic Agent - OS - Left Eye       Time Out 08/25/2023. 2:22 PM. Confirmed correct patient, procedure, site, and patient consented.   Anesthesia Topical anesthesia was used. Anesthetic medications included Lidocaine 2%, Proparacaine 0.5%.   Procedure Preparation included 5% betadine to ocular surface, eyelid speculum. A supplied needle was used.   Injection: 1.25 mg Bevacizumab 1.25mg /0.25ml   Route: Intravitreal, Site: Left Eye   NDC: 40981-191-47, Lot: [id number] , Expiration date: 09/19/2023   Post-op Post injection exam found visual acuity of at least counting fingers. The patient tolerated the procedure well. There were no complications. The patient received written and verbal post procedure care education.            ASSESSMENT/PLAN:    ICD-10-CM   1. Exudative age-related macular degeneration of left eye with active choroidal neovascularization (HCC)  H35.3221 OCT, Retina - OU - Both Eyes    Intravitreal Injection, Pharmacologic Agent - OS - Left Eye    Bevacizumab (AVASTIN) SOLN 1.25 mg    2. Intermediate  stage nonexudative age-related macular degeneration of right eye  H35.3112     3. Essential hypertension  I10     4. Hypertensive retinopathy of both eyes  H35.033     5. Pseudophakia, both eyes  Z96.1      Exudative age related macular degeneration, left eye    - interval conversion to exu ARMD OS on 01.22.25 exam -- new heme on exam, BCVA OS 20/30 from 20/20 - s/p IVA OS #1 (01.22.25), #2 (02.18.25), #3 (03.18.25)  - today BCVA OS 20/40 from 20/30  - OCT OS shows Stable improvement in central PEDs and SRHM; interval improvement in large central cyst with persistent cystic changes surrounding, patchy ORA at 4 wks  - recommend IVA OS #4 today, 04.15.25 w/ f/u in 4 wks  - pt wishes to be treated with IVA  - RBA of procedure discussed, questions answered - IVA informed consent obtained and signed, 01.22.25, - see procedure note - Eylea approved for 2025 -- no funding for Good Days available  - f/u in 4 wks -- DFE/OCT, possible injxn  2. Age related macular degeneration, non-exudative, right eye  - intermediate stage w/ no IRF/SRF OU -- stable - OCT shows OD: Patchy ORA; OS: Central PEDs and patchy ORA; focal cystic changes centrally  - The incidence, anatomy, and pathology of dry AMD, risk of progression, and the AREDS and AREDS 2 study including smoking risks discussed with patient.  - Recommend amsler grid monitoring  - monitor  3,4. Hypertensive retinopathy OU - discussed importance of tight BP control - monitor   5. Pseudophakia OU  - s/p CE/IOL OU  - IOLs in good position, doing well  - monitor   Ophthalmic Meds Ordered this visit:  Meds ordered this encounter  Medications   Bevacizumab (AVASTIN) SOLN 1.25 mg     Return in about 4 weeks (around 09/22/2023) for f/u exu ARMD OS, DFE, OCT, Possible Injxn.  There are no  Patient Instructions on file for this visit.   Explained the diagnoses, plan, and follow up with the patient and they expressed understanding.  Patient  expressed understanding of the importance of proper follow up care.   This document serves as a record of services personally performed by Jeanice Millard, MD, PhD. It was created on their behalf by Olene Berne, COT an ophthalmic technician. The creation of this record is the provider's dictation and/or activities during the visit.    Electronically signed by:  Olene Berne, COT  08/25/23 4:55 PM  This document serves as a record of services personally performed by Jeanice Millard, MD, PhD. It was created on their behalf by Morley Arabia. Bevin Bucks, OA an ophthalmic technician. The creation of this record is the provider's dictation and/or activities during the visit.    Electronically signed by: Morley Arabia. Bevin Bucks, OA 08/25/23 4:55 PM   Jeanice Millard, M.D., Ph.D. Diseases & Surgery of the Retina and Vitreous [facility]  I have reviewed the above documentation for accuracy and completeness, and I agree with the above. Jeanice Millard, M.D., Ph.D. 08/25/23 5:01 PM   Abbreviations: M myopia (nearsighted); A astigmatism; H hyperopia (farsighted); P presbyopia; Mrx spectacle prescription;  CTL contact lenses; OD right eye; OS left eye; OU both eyes  XT exotropia; ET esotropia; PEK punctate epithelial keratitis; PEE punctate epithelial erosions; DES dry eye syndrome; MGD meibomian gland dysfunction; ATs artificial tears; PFAT's preservative free artificial tears; NSC nuclear sclerotic cataract; PSC posterior subcapsular cataract; ERM epi-retinal membrane; PVD posterior vitreous detachment; RD retinal detachment; DM diabetes mellitus; DR diabetic retinopathy; NPDR non-proliferative diabetic retinopathy; PDR proliferative diabetic retinopathy; CSME clinically significant macular edema; DME diabetic macular edema; dbh dot blot hemorrhages; CWS cotton wool spot; POAG primary open angle glaucoma; C/D cup-to-disc ratio; HVF humphrey visual field; GVF goldmann visual field; OCT  optical coherence tomography; IOP intraocular pressure; BRVO Branch retinal vein occlusion; CRVO central retinal vein occlusion; CRAO central retinal artery occlusion; BRAO branch retinal artery occlusion; RT retinal tear; SB scleral buckle; PPV pars plana vitrectomy; VH Vitreous hemorrhage; PRP panretinal laser photocoagulation; IVK intravitreal kenalog; VMT vitreomacular traction; MH Macular hole;  NVD neovascularization of the disc; NVE neovascularization elsewhere; AREDS age related eye disease study; ARMD age related macular degeneration; POAG primary open angle glaucoma; EBMD epithelial/anterior basement membrane dystrophy; ACIOL anterior chamber intraocular lens; IOL intraocular lens; PCIOL posterior chamber intraocular lens; Phaco/IOL phacoemulsification with intraocular lens placement; PRK photorefractive keratectomy; LASIK laser assisted in situ keratomileusis; HTN hypertension; DM diabetes mellitus; COPD chronic obstructive pulmonary disease

## 2023-08-25 ENCOUNTER — Ambulatory Visit (INDEPENDENT_AMBULATORY_CARE_PROVIDER_SITE_OTHER): Admitting: Ophthalmology

## 2023-08-25 ENCOUNTER — Encounter (INDEPENDENT_AMBULATORY_CARE_PROVIDER_SITE_OTHER): Payer: Self-pay | Admitting: Ophthalmology

## 2023-08-25 DIAGNOSIS — I10 Essential (primary) hypertension: Secondary | ICD-10-CM

## 2023-08-25 DIAGNOSIS — Z961 Presence of intraocular lens: Secondary | ICD-10-CM

## 2023-08-25 DIAGNOSIS — H353221 Exudative age-related macular degeneration, left eye, with active choroidal neovascularization: Secondary | ICD-10-CM

## 2023-08-25 DIAGNOSIS — H353112 Nonexudative age-related macular degeneration, right eye, intermediate dry stage: Secondary | ICD-10-CM

## 2023-08-25 DIAGNOSIS — H35033 Hypertensive retinopathy, bilateral: Secondary | ICD-10-CM | POA: Diagnosis not present

## 2023-08-25 MED ORDER — BEVACIZUMAB CHEMO INJECTION 1.25MG/0.05ML SYRINGE FOR KALEIDOSCOPE
1.2500 mg | INTRAVITREAL | Status: AC | PRN
Start: 2023-08-25 — End: 2023-08-25
  Administered 2023-08-25: 1.25 mg via INTRAVITREAL

## 2023-09-08 DIAGNOSIS — D0439 Carcinoma in situ of skin of other parts of face: Secondary | ICD-10-CM | POA: Diagnosis not present

## 2023-09-08 DIAGNOSIS — L570 Actinic keratosis: Secondary | ICD-10-CM | POA: Diagnosis not present

## 2023-09-08 DIAGNOSIS — L814 Other melanin hyperpigmentation: Secondary | ICD-10-CM | POA: Diagnosis not present

## 2023-09-08 DIAGNOSIS — L821 Other seborrheic keratosis: Secondary | ICD-10-CM | POA: Diagnosis not present

## 2023-09-08 DIAGNOSIS — D492 Neoplasm of unspecified behavior of bone, soft tissue, and skin: Secondary | ICD-10-CM | POA: Diagnosis not present

## 2023-09-08 DIAGNOSIS — D225 Melanocytic nevi of trunk: Secondary | ICD-10-CM | POA: Diagnosis not present

## 2023-09-09 NOTE — Progress Notes (Signed)
 [facility] - Clinic Note  09/22/2023     CHIEF COMPLAINT Patient presents for Retina Follow Up   HISTORY OF PRESENT ILLNESS: [name] is a 89 y.o. male who presents to the clinic today for:   HPI     Retina Follow Up   Patient presents with  Wet AMD.  In left eye.  This started 4 weeks ago.  I, the attending physician,  performed the HPI with the patient and updated documentation appropriately.        Comments   Patient here for 4 weeks retina follow up for exu ARMD OS. Patient states vision seeing a few little spots in OS. Otherwise, not too bad. No eye pain.       Last edited by Ronelle Coffee, MD on 09/22/2023  5:36 PM.     Pt states he is seeing some floaters, but vision is stable   Referring physician: Amedeo Jupiter, MD [address] [place],  [place] [address]  HISTORICAL INFORMATION:   Selected notes from the MEDICAL RECORD NUMBER Referred by Dr. Lasandra Points for concern of exu ARMD  LEE:  Ocular Hx- PMH-    CURRENT MEDICATIONS: No current outpatient medications on file. (Ophthalmic Drugs)   No current facility-administered medications for this visit. (Ophthalmic Drugs)   Current Outpatient Medications (Other)  Medication Sig   Ascorbic Acid (VITAMIN C PO) Take 1 tablet by mouth every Monday, Wednesday, and Friday. Takes along with vit e   aspirin  EC 81 MG tablet Take 1 tablet (81 mg total) by mouth daily. Swallow whole.   b complex vitamins capsule Take 1 capsule by mouth daily in the afternoon.   Calcium  Carb-Cholecalciferol (CALCIUM  600 + D PO) Take 1 tablet by mouth 3 (three) times a week.   Glucosamine HCl (GLUCOSAMINE PO) Take 1 tablet by mouth in the morning and at bedtime.   lisinopril -hydrochlorothiazide  (ZESTORETIC ) 20-12.5 MG tablet Take 1 tablet by mouth daily.   lisinopril -hydrochlorothiazide  (ZESTORETIC ) 20-25 MG tablet Take 1 tablet by mouth daily.   Multiple Vitamins-Minerals (PRESERVISION AREDS PO) Take 1 tablet  by mouth daily in the afternoon.   Omega-3 Fatty Acids (FISH OIL) 1000 MG CAPS Take 1,000 mg by mouth daily.   Potassium (POTASSIMIN PO) Take 1 tablet by mouth in the morning and at bedtime.   vitamin E 180 MG (400 UNITS) capsule Take 400 Units by mouth every Monday, Wednesday, and Friday.   No current facility-administered medications for this visit. (Other)   REVIEW OF SYSTEMS: ROS   Positive for: Eyes Negative for: Constitutional, Gastrointestinal, Neurological, Skin, Genitourinary, Musculoskeletal, HENT, Endocrine, Cardiovascular, Respiratory, Psychiatric, Allergic/Imm, Heme/Lymph Last edited by Sylvan Evener, COA on 09/22/2023  1:56 PM.     ALLERGIES Allergies  Allergen Reactions   Prednisone Other (See Comments)    Unknown    PAST MEDICAL HISTORY Past Medical History:  Diagnosis Date   Hypertension    Macular degeneration    Past Surgical History:  Procedure Laterality Date   CATARACT EXTRACTION     EYE SURGERY     ROTATOR CUFF REPAIR Bilateral    FAMILY HISTORY History reviewed. No pertinent family history.  SOCIAL HISTORY Social History   Tobacco Use   Smoking status: Never  Vaping Use   Vaping status: Unknown  Substance Use Topics   Alcohol use: No   Drug use: No       OPHTHALMIC EXAM:  Base Eye Exam     Visual Acuity (Snellen -  Linear)       Right Left   Dist cc 20/20 20/30    Correction: Glasses         Tonometry (Tonopen, 1:54 PM)       Right Left   Pressure 22 20         Pupils       Dark Light Shape React APD   Right 3 2 Round Brisk None   Left 3 2 Round Brisk None         Visual Fields (Counting fingers)       Left Right    Full Full         Extraocular Movement       Right Left    Full, Ortho Full, Ortho         Neuro/Psych     Oriented x3: Yes   Mood/Affect: Normal         Dilation     Both eyes: 1.0% Mydriacyl, 2.5% Phenylephrine @ 1:54 PM           Slit Lamp and Fundus Exam     Slit  Lamp Exam       Right Left   Lids/Lashes Dermatochalasis - upper lid, mild MGD Dermatochalasis - upper lid, mild MGD   Conjunctiva/Sclera nasal pingeucula nasal pingeucula   Cornea arcus, trace Punctate epithelial erosions arcus, trace Punctate epithelial erosions   Anterior Chamber Deep and quiet Deep and quiet   Iris Round and poorly dilated Round and poorly dilated   Lens PC IOL in good position PC IOL in good position   Anterior Vitreous Vitreous syneresis Vitreous syneresis         Fundus Exam       Right Left   Disc mild Pallor, Sharp rim mild Pallor, Sharp rim, +PPA, Thin superior rim   C/D Ratio 0.5 0.6   Macula Flat, Blunted foveal reflex, Drusen, RPE mottling, clumping and central atrophy, No heme or edema Flat, Blunted foveal reflex, Drusen, RPE mottling and clumping, central PED and heme --improved, central cystic changes and edema   Vessels attenuated, Tortuous attenuated, mild tortuosity   Periphery Attached, mild reticular degeneration, mild peripheral drusen, No heme Attached, mild reticular degeneration, peripheral paving stone degeneration, No heme           Refraction     Wearing Rx       Sphere Cylinder Axis Add   Right -2.25 +3.25 180 +3.00   Left -1.25 +3.25 171 +3.00    Type: Bifocal           IMAGING AND PROCEDURES  Imaging and Procedures for 09/22/2023  OCT, Retina - OU - Both Eyes        Right Eye Quality was good. Central Foveal Thickness: 285. Progression has been stable. Findings include normal foveal contour, no IRF, no SRF, retinal drusen , outer retinal atrophy (Patchy ORA).   Left Eye Quality was good. Central Foveal Thickness: 278. Progression has worsened. Findings include normal foveal contour, no SRF, retinal drusen , subretinal hyper-reflective material, intraretinal fluid, pigment epithelial detachment, outer retinal atrophy (Stable improvement in central PEDs and SRHM; interval increase in central cyst with persistent cystic  changes surrounding, patchy ORA).   Notes  *Images captured and stored on drive  Diagnosis / Impression:  OD: Nonexudative ARMD - Patchy ORA; no IRF/SRF OS: Stable improvement in central PEDs and SRHM; interval increase in central cyst with persistent cystic changes surrounding, patchy ORA  Clinical management:  See  below  Abbreviations: NFP - Normal foveal profile. CME - cystoid macular edema. PED - pigment epithelial detachment. IRF - intraretinal fluid. SRF - subretinal fluid. EZ - ellipsoid zone. ERM - epiretinal membrane. ORA - outer retinal atrophy. ORT - outer retinal tubulation. SRHM - subretinal hyper-reflective material. IRHM - intraretinal hyper-reflective material      Intravitreal Injection, Pharmacologic Agent - OS - Left Eye       Time Out 09/22/2023. 2:22 PM. Confirmed correct patient, procedure, site, and patient consented.   Anesthesia Topical anesthesia was used. Anesthetic medications included Lidocaine 2%, Proparacaine 0.5%.   Procedure Preparation included 5% betadine to ocular surface, eyelid speculum. A (32g) needle was used.   Injection: 1.25 mg Bevacizumab  1.25mg /0.69ml   Route: Intravitreal, Site: Left Eye   NDC: [id number], Lot: [id number], Expiration date: 01/14/2024   Post-op Post injection exam found visual acuity of at least counting fingers. The patient tolerated the procedure well. There were no complications. The patient received written and verbal post procedure care education.            ASSESSMENT/PLAN:    ICD-10-CM   1. Exudative age-related macular degeneration of left eye with active choroidal neovascularization (HCC)  H35.3221 OCT, Retina - OU - Both Eyes    Intravitreal Injection, Pharmacologic Agent - OS - Left Eye    Bevacizumab  (AVASTIN ) SOLN 1.25 mg    2. Intermediate stage nonexudative age-related macular degeneration of right eye  H35.3112     3. Essential hypertension  I10     4. Hypertensive retinopathy of both eyes   H35.033     5. Pseudophakia, both eyes  Z96.1      Exudative age related macular degeneration, left eye    - interval conversion to exu ARMD OS on 01.22.25 exam -- new heme on exam, BCVA OS 20/30 from 20/20 - s/p IVA OS #1 (01.22.25), #2 (02.18.25), #3 (03.18.25), #4 (04.15.25)  - today BCVA OS 20/30 from 20/40  - OCT OS Stable improvement in central PEDs and SRHM; interval increase in central cyst with persistent cystic changes surrounding, patchy ORA 4 wks  - recommend IVA OS #5 today, 05.13.25 w/ f/u in 5 wks due to MD being out of the office in 4 weeks  - pt wishes to be treated with IVA  - RBA of procedure discussed, questions answered - IVA informed consent obtained and signed, 01.22.25, - see procedure note - Eylea approved for 2025 -- no funding for Good Days available  - f/u in 4 wks -- DFE/OCT, possible injxn  2. Age related macular degeneration, non-exudative, right eye  - intermediate stage w/ no IRF/SRF OU -- stable - OCT shows OD: Patchy ORA; OS: Central PEDs and patchy ORA; focal cystic changes centrally  - The incidence, anatomy, and pathology of dry AMD, risk of progression, and the AREDS and AREDS 2 study including smoking risks discussed with patient.  - Recommend amsler grid monitoring  - monitor  3,4. Hypertensive retinopathy OU - discussed importance of tight BP control - monitor   5. Pseudophakia OU  - s/p CE/IOL OU  - IOLs in good position, doing well  - monitor   Ophthalmic Meds Ordered this visit:  Meds ordered this encounter  Medications   Bevacizumab  (AVASTIN ) SOLN 1.25 mg     Return in about 5 weeks (around 10/27/2023) for f/u exu ARMD OS, DFE, OCT, Possible Injxn.  There are no Patient Instructions on file for this visit.   Explained the diagnoses, plan,  and follow up with the patient and they expressed understanding.  Patient expressed understanding of the importance of proper follow up care.   This document serves as a record of services  personally performed by Jeanice Millard, MD, PhD. It was created on their behalf by Olene Berne, COT an ophthalmic technician. The creation of this record is the provider's dictation and/or activities during the visit.    Electronically signed by:  Olene Berne, COT  09/25/23 1:44 AM  This document serves as a record of services personally performed by Jeanice Millard, MD, PhD. It was created on their behalf by Morley Arabia. Bevin Bucks, OA an ophthalmic technician. The creation of this record is the provider's dictation and/or activities during the visit.    Electronically signed by: Morley Arabia. Bevin Bucks, OA 09/25/23 1:44 AM  Jeanice Millard, M.D., Ph.D. Diseases & Surgery of the Retina and Vitreous [facility]  I have reviewed the above documentation for accuracy and completeness, and I agree with the above. Jeanice Millard, M.D., Ph.D. 09/25/23 1:47 AM   Abbreviations: M myopia (nearsighted); A astigmatism; H hyperopia (farsighted); P presbyopia; Mrx spectacle prescription;  CTL contact lenses; OD right eye; OS left eye; OU both eyes  XT exotropia; ET esotropia; PEK punctate epithelial keratitis; PEE punctate epithelial erosions; DES dry eye syndrome; MGD meibomian gland dysfunction; ATs artificial tears; PFAT's preservative free artificial tears; NSC nuclear sclerotic cataract; PSC posterior subcapsular cataract; ERM epi-retinal membrane; PVD posterior vitreous detachment; RD retinal detachment; DM diabetes mellitus; DR diabetic retinopathy; NPDR non-proliferative diabetic retinopathy; PDR proliferative diabetic retinopathy; CSME clinically significant macular edema; DME diabetic macular edema; dbh dot blot hemorrhages; CWS cotton wool spot; POAG primary open angle glaucoma; C/D cup-to-disc ratio; HVF humphrey visual field; GVF goldmann visual field; OCT optical coherence tomography; IOP intraocular pressure; BRVO Branch retinal vein occlusion; CRVO central retinal vein  occlusion; CRAO central retinal artery occlusion; BRAO branch retinal artery occlusion; RT retinal tear; SB scleral buckle; PPV pars plana vitrectomy; VH Vitreous hemorrhage; PRP panretinal laser photocoagulation; IVK intravitreal kenalog; VMT vitreomacular traction; MH Macular hole;  NVD neovascularization of the disc; NVE neovascularization elsewhere; AREDS age related eye disease study; ARMD age related macular degeneration; POAG primary open angle glaucoma; EBMD epithelial/anterior basement membrane dystrophy; ACIOL anterior chamber intraocular lens; IOL intraocular lens; PCIOL posterior chamber intraocular lens; Phaco/IOL phacoemulsification with intraocular lens placement; PRK photorefractive keratectomy; LASIK laser assisted in situ keratomileusis; HTN hypertension; DM diabetes mellitus; COPD chronic obstructive pulmonary disease

## 2023-09-14 DIAGNOSIS — C44719 Basal cell carcinoma of skin of left lower limb, including hip: Secondary | ICD-10-CM | POA: Diagnosis not present

## 2023-09-14 DIAGNOSIS — L570 Actinic keratosis: Secondary | ICD-10-CM | POA: Diagnosis not present

## 2023-09-14 DIAGNOSIS — D0471 Carcinoma in situ of skin of right lower limb, including hip: Secondary | ICD-10-CM | POA: Diagnosis not present

## 2023-09-14 DIAGNOSIS — D485 Neoplasm of uncertain behavior of skin: Secondary | ICD-10-CM | POA: Diagnosis not present

## 2023-09-14 DIAGNOSIS — C44729 Squamous cell carcinoma of skin of left lower limb, including hip: Secondary | ICD-10-CM | POA: Diagnosis not present

## 2023-09-22 ENCOUNTER — Encounter (INDEPENDENT_AMBULATORY_CARE_PROVIDER_SITE_OTHER): Payer: Self-pay | Admitting: Ophthalmology

## 2023-09-22 ENCOUNTER — Ambulatory Visit (INDEPENDENT_AMBULATORY_CARE_PROVIDER_SITE_OTHER): Admitting: Ophthalmology

## 2023-09-22 DIAGNOSIS — Z961 Presence of intraocular lens: Secondary | ICD-10-CM

## 2023-09-22 DIAGNOSIS — I10 Essential (primary) hypertension: Secondary | ICD-10-CM

## 2023-09-22 DIAGNOSIS — H353112 Nonexudative age-related macular degeneration, right eye, intermediate dry stage: Secondary | ICD-10-CM | POA: Diagnosis not present

## 2023-09-22 DIAGNOSIS — H353221 Exudative age-related macular degeneration, left eye, with active choroidal neovascularization: Secondary | ICD-10-CM

## 2023-09-22 DIAGNOSIS — H35033 Hypertensive retinopathy, bilateral: Secondary | ICD-10-CM

## 2023-09-22 MED ORDER — BEVACIZUMAB CHEMO INJECTION 1.25MG/0.05ML SYRINGE FOR KALEIDOSCOPE
1.2500 mg | INTRAVITREAL | Status: AC | PRN
  Administered 2023-09-22: 1.25 mg via INTRAVITREAL

## 2023-09-23 DIAGNOSIS — C44519 Basal cell carcinoma of skin of other part of trunk: Secondary | ICD-10-CM | POA: Diagnosis not present

## 2023-09-23 DIAGNOSIS — D0471 Carcinoma in situ of skin of right lower limb, including hip: Secondary | ICD-10-CM | POA: Diagnosis not present

## 2023-09-23 DIAGNOSIS — D485 Neoplasm of uncertain behavior of skin: Secondary | ICD-10-CM | POA: Diagnosis not present

## 2023-09-23 DIAGNOSIS — C44729 Squamous cell carcinoma of skin of left lower limb, including hip: Secondary | ICD-10-CM | POA: Diagnosis not present

## 2023-09-29 DIAGNOSIS — D0471 Carcinoma in situ of skin of right lower limb, including hip: Secondary | ICD-10-CM | POA: Diagnosis not present

## 2023-09-29 DIAGNOSIS — C44719 Basal cell carcinoma of skin of left lower limb, including hip: Secondary | ICD-10-CM | POA: Diagnosis not present

## 2023-09-29 DIAGNOSIS — C44729 Squamous cell carcinoma of skin of left lower limb, including hip: Secondary | ICD-10-CM | POA: Diagnosis not present

## 2023-10-13 DIAGNOSIS — C44519 Basal cell carcinoma of skin of other part of trunk: Secondary | ICD-10-CM | POA: Diagnosis not present

## 2023-10-16 ENCOUNTER — Encounter: Payer: Self-pay | Admitting: Cardiology

## 2023-10-20 NOTE — Progress Notes (Signed)
 [facility] - Clinic Note  10/28/2023     CHIEF COMPLAINT Patient presents for Retina Follow Up   HISTORY OF PRESENT ILLNESS: [name] is a 89 y.o. male who presents to the clinic today for:   HPI     Retina Follow Up   Patient presents with  Wet AMD.  In both eyes.  This started 5 weeks ago.  Duration of 5 weeks.  Since onset it is stable.  I, the attending physician,  performed the HPI with the patient and updated documentation appropriately.        Comments   5 week retina follow up ARMD OS and IVA OS pt is reporting no vision changes noticed he has some floaters denies any flashes       Last edited by Valdemar Rogue, MD on 10/28/2023  5:02 PM.      Pt states he is seeing some floaters, but vision is stable   Referring physician: Cleatus Collar, MD [address] [place],  [place] [address]  HISTORICAL INFORMATION:   Selected notes from the MEDICAL RECORD NUMBER Referred by Dr. Cleatus for concern of exu ARMD  LEE:  Ocular Hx- PMH-    CURRENT MEDICATIONS: No current outpatient medications on file. (Ophthalmic Drugs)   No current facility-administered medications for this visit. (Ophthalmic Drugs)   Current Outpatient Medications (Other)  Medication Sig   Ascorbic Acid (VITAMIN C PO) Take 1 tablet by mouth every Monday, Wednesday, and Friday. Takes along with vit e   aspirin  EC 81 MG tablet Take 1 tablet (81 mg total) by mouth daily. Swallow whole.   b complex vitamins capsule Take 1 capsule by mouth daily in the afternoon.   Calcium  Carb-Cholecalciferol (CALCIUM  600 + D PO) Take 1 tablet by mouth 3 (three) times a week.   Glucosamine HCl (GLUCOSAMINE PO) Take 1 tablet by mouth in the morning and at bedtime.   lisinopril -hydrochlorothiazide  (ZESTORETIC ) 20-12.5 MG tablet Take 1 tablet by mouth daily.   lisinopril -hydrochlorothiazide  (ZESTORETIC ) 20-25 MG tablet Take 1 tablet by mouth daily.   Multiple Vitamins-Minerals  (PRESERVISION AREDS PO) Take 1 tablet by mouth daily in the afternoon.   Omega-3 Fatty Acids (FISH OIL) 1000 MG CAPS Take 1,000 mg by mouth daily.   Potassium (POTASSIMIN PO) Take 1 tablet by mouth in the morning and at bedtime.   vitamin E 180 MG (400 UNITS) capsule Take 400 Units by mouth every Monday, Wednesday, and Friday.   No current facility-administered medications for this visit. (Other)   REVIEW OF SYSTEMS: ROS   Positive for: Eyes Negative for: Constitutional, Gastrointestinal, Neurological, Skin, Genitourinary, Musculoskeletal, HENT, Endocrine, Cardiovascular, Respiratory, Psychiatric, Allergic/Imm, Heme/Lymph Last edited by Resa Delon ORN, COT on 10/28/2023  2:02 PM.      ALLERGIES Allergies  Allergen Reactions   Prednisone Other (See Comments)    Unknown    PAST MEDICAL HISTORY Past Medical History:  Diagnosis Date   Hypertension    Macular degeneration    Past Surgical History:  Procedure Laterality Date   CATARACT EXTRACTION     EYE SURGERY     ROTATOR CUFF REPAIR Bilateral    FAMILY HISTORY History reviewed. No pertinent family history.  SOCIAL HISTORY Social History   Tobacco Use   Smoking status: Never  Vaping Use   Vaping status: Unknown  Substance Use Topics   Alcohol use: No   Drug use: No       OPHTHALMIC EXAM:  Base Eye  Exam     Visual Acuity (Snellen - Linear)       Right Left   Dist cc 20/25 -1 20/25 -3   Dist ph cc NI     Correction: Glasses         Tonometry (Tonopen, 2:11 PM)       Right Left   Pressure 15 18         Pupils       Pupils Dark Light Shape React APD   Right PERRL 3 2 Round Brisk None   Left PERRL 3 2 Round Brisk None         Visual Fields       Left Right    Full Full         Extraocular Movement       Right Left    Full, Ortho Full, Ortho         Neuro/Psych     Oriented x3: Yes   Mood/Affect: Normal         Dilation     Both eyes: 2.5% Phenylephrine @ 2:11 PM            Slit Lamp and Fundus Exam     Slit Lamp Exam       Right Left   Lids/Lashes Dermatochalasis - upper lid, mild MGD Dermatochalasis - upper lid, mild MGD   Conjunctiva/Sclera nasal pingeucula nasal pingeucula   Cornea arcus, trace Punctate epithelial erosions arcus, trace Punctate epithelial erosions   Anterior Chamber Deep and quiet Deep and quiet   Iris Round and poorly dilated Round and poorly dilated   Lens PC IOL in good position PC IOL in good position   Anterior Vitreous Vitreous syneresis Vitreous syneresis         Fundus Exam       Right Left   Disc mild Pallor, Sharp rim mild Pallor, Sharp rim, +PPA, Thin superior rim   C/D Ratio 0.5 0.6   Macula Flat, Blunted foveal reflex, Drusen, RPE mottling, clumping and central atrophy, No heme or edema Flat, Blunted foveal reflex, Drusen, RPE mottling and clumping, central PED and heme --improved, central cystic changes and edema, improved.   Vessels attenuated, Tortuous attenuated, mild tortuosity   Periphery Attached, mild reticular degeneration, mild peripheral drusen, No heme Attached, mild reticular degeneration, peripheral paving stone degeneration, No heme           Refraction     Wearing Rx       Sphere Cylinder Axis Add   Right -2.25 +3.25 180 +3.00   Left -1.25 +3.25 171 +3.00    Type: Bifocal           IMAGING AND PROCEDURES  Imaging and Procedures for 10/28/2023  OCT, Retina - OU - Both Eyes       Right Eye Quality was good. Central Foveal Thickness: 285. Progression has been stable. Findings include normal foveal contour, no IRF, no SRF, retinal drusen , outer retinal atrophy (Patchy ORA, trace cystic changes centrally. ).   Left Eye Quality was good. Central Foveal Thickness: 244. Progression has improved. Findings include normal foveal contour, no IRF, no SRF, retinal drusen , subretinal hyper-reflective material, intraretinal hyper-reflective material, pigment epithelial detachment,  outer retinal atrophy (Stable improvement in central PEDs and SRHM; interval improvement in central cyst , patchy ORA).   Notes *Images captured and stored on drive  Diagnosis / Impression:  OD: Nonexudative ARMD - Patchy ORA, trace cystic changes centrally.  OS: Stable  improvement in central PEDs and SRHM; interval improvement in central cyst , patchy ORA  Clinical management:  See below  Abbreviations: NFP - Normal foveal profile. CME - cystoid macular edema. PED - pigment epithelial detachment. IRF - intraretinal fluid. SRF - subretinal fluid. EZ - ellipsoid zone. ERM - epiretinal membrane. ORA - outer retinal atrophy. ORT - outer retinal tubulation. SRHM - subretinal hyper-reflective material. IRHM - intraretinal hyper-reflective material      Intravitreal Injection, Pharmacologic Agent - OS - Left Eye       Time Out 10/28/2023. 3:19 PM. Confirmed correct patient, procedure, site, and patient consented.   Anesthesia Topical anesthesia was used. Anesthetic medications included Lidocaine 2%, Proparacaine 0.5%.   Procedure Preparation included 5% betadine to ocular surface, eyelid speculum. A (32g) needle was used.   Injection: 1.25 mg Bevacizumab  1.25mg /0.80ml   Route: Intravitreal, Site: Left Eye   NDC: [id number], Lot: [id number], Expiration date: 01/14/2024   Post-op Post injection exam found visual acuity of at least counting fingers. The patient tolerated the procedure well. There were no complications. The patient received written and verbal post procedure care education.            ASSESSMENT/PLAN:    ICD-10-CM   1. Exudative age-related macular degeneration of left eye with active choroidal neovascularization (HCC)  H35.3221 OCT, Retina - OU - Both Eyes    Intravitreal Injection, Pharmacologic Agent - OS - Left Eye    Bevacizumab  (AVASTIN ) SOLN 1.25 mg    2. Intermediate stage nonexudative age-related macular degeneration of right eye  H35.3112     3.  Essential hypertension  I10     4. Hypertensive retinopathy of both eyes  H35.033     5. Pseudophakia, both eyes  Z96.1      Exudative age related macular degeneration, left eye    - interval conversion to exu ARMD OS on 01.22.25 exam -- new heme on exam, BCVA OS 20/30 from 20/20 - s/p IVA OS #1 (01.22.25), #2 (02.18.25), #3 (03.18.25), #4 (04.15.25), #5 (05.13.25)  - today BCVA OS 20/25 from 20/30  - OCT OS Stable improvement in central PEDs and SRHM; interval improvement in central cyst , patchy ORA at 5 wks  - recommend IVA OS #6 today, 06.18.25 w/ f/u in 5 wks   - pt wishes to be treated with IVA  - RBA of procedure discussed, questions answered - IVA informed consent obtained and signed, 01.22.25, - see procedure note - Eylea approved for 2025 -- no funding for Good Days available  - f/u in 5 wks -- DFE/OCT, possible injxn  2. Age related macular degeneration, non-exudative, right eye  - intermediate stage w/ no IRF/SRF OU -- stable - OCT shows OD: Patchy ORA, trace cystic changes; OS: Central PEDs and patchy ORA; focal cystic changes centrally  - The incidence, anatomy, and pathology of dry AMD, risk of progression, and the AREDS and AREDS 2 study including smoking risks discussed with patient.  - Recommend amsler grid monitoring  - monitor  3,4. Hypertensive retinopathy OU - discussed importance of tight BP control - monitor   5. Pseudophakia OU  - s/p CE/IOL OU  - IOLs in good position, doing well  - monitor   Ophthalmic Meds Ordered this visit:  Meds ordered this encounter  Medications   Bevacizumab  (AVASTIN ) SOLN 1.25 mg     Return in about 5 weeks (around 12/02/2023) for exu ARMD OS, DFE, OCT. , Possible Injxn.  There are no Patient Instructions  on file for this visit.   Explained the diagnoses, plan, and follow up with the patient and they expressed understanding.  Patient expressed understanding of the importance of proper follow up care.   This document  serves as a record of services personally performed by Redell JUDITHANN Hans, MD, PhD. It was created on their behalf by Almetta Pesa, an ophthalmic technician. The creation of this record is the provider's dictation and/or activities during the visit.    Electronically signed by: Almetta Pesa, OA, 11/01/23  3:10 PM  Redell JUDITHANN Hans, M.D., Ph.D. Diseases & Surgery of the Retina and Vitreous [facility]  I have reviewed the above documentation for accuracy and completeness, and I agree with the above. Redell JUDITHANN Hans, M.D., Ph.D. 11/01/23 3:11 PM   Abbreviations: M myopia (nearsighted); A astigmatism; H hyperopia (farsighted); P presbyopia; Mrx spectacle prescription;  CTL contact lenses; OD right eye; OS left eye; OU both eyes  XT exotropia; ET esotropia; PEK punctate epithelial keratitis; PEE punctate epithelial erosions; DES dry eye syndrome; MGD meibomian gland dysfunction; ATs artificial tears; PFAT's preservative free artificial tears; NSC nuclear sclerotic cataract; PSC posterior subcapsular cataract; ERM epi-retinal membrane; PVD posterior vitreous detachment; RD retinal detachment; DM diabetes mellitus; DR diabetic retinopathy; NPDR non-proliferative diabetic retinopathy; PDR proliferative diabetic retinopathy; CSME clinically significant macular edema; DME diabetic macular edema; dbh dot blot hemorrhages; CWS cotton wool spot; POAG primary open angle glaucoma; C/D cup-to-disc ratio; HVF humphrey visual field; GVF goldmann visual field; OCT optical coherence tomography; IOP intraocular pressure; BRVO Branch retinal vein occlusion; CRVO central retinal vein occlusion; CRAO central retinal artery occlusion; BRAO branch retinal artery occlusion; RT retinal tear; SB scleral buckle; PPV pars plana vitrectomy; VH Vitreous hemorrhage; PRP panretinal laser photocoagulation; IVK intravitreal kenalog; VMT vitreomacular traction; MH Macular hole;  NVD neovascularization of the disc;  NVE neovascularization elsewhere; AREDS age related eye disease study; ARMD age related macular degeneration; POAG primary open angle glaucoma; EBMD epithelial/anterior basement membrane dystrophy; ACIOL anterior chamber intraocular lens; IOL intraocular lens; PCIOL posterior chamber intraocular lens; Phaco/IOL phacoemulsification with intraocular lens placement; PRK photorefractive keratectomy; LASIK laser assisted in situ keratomileusis; HTN hypertension; DM diabetes mellitus; COPD chronic obstructive pulmonary disease

## 2023-10-21 DIAGNOSIS — T148XXA Other injury of unspecified body region, initial encounter: Secondary | ICD-10-CM | POA: Diagnosis not present

## 2023-10-28 ENCOUNTER — Encounter (INDEPENDENT_AMBULATORY_CARE_PROVIDER_SITE_OTHER): Payer: Self-pay | Admitting: Ophthalmology

## 2023-10-28 ENCOUNTER — Ambulatory Visit (INDEPENDENT_AMBULATORY_CARE_PROVIDER_SITE_OTHER): Admitting: Ophthalmology

## 2023-10-28 DIAGNOSIS — H353112 Nonexudative age-related macular degeneration, right eye, intermediate dry stage: Secondary | ICD-10-CM | POA: Diagnosis not present

## 2023-10-28 DIAGNOSIS — I10 Essential (primary) hypertension: Secondary | ICD-10-CM

## 2023-10-28 DIAGNOSIS — H35033 Hypertensive retinopathy, bilateral: Secondary | ICD-10-CM

## 2023-10-28 DIAGNOSIS — H353221 Exudative age-related macular degeneration, left eye, with active choroidal neovascularization: Secondary | ICD-10-CM | POA: Diagnosis not present

## 2023-10-28 DIAGNOSIS — Z961 Presence of intraocular lens: Secondary | ICD-10-CM

## 2023-10-28 MED ORDER — BEVACIZUMAB CHEMO INJECTION 1.25MG/0.05ML SYRINGE FOR KALEIDOSCOPE
1.2500 mg | INTRAVITREAL | Status: AC | PRN
  Administered 2023-10-28: 1.25 mg via INTRAVITREAL

## 2023-11-05 DIAGNOSIS — S81801A Unspecified open wound, right lower leg, initial encounter: Secondary | ICD-10-CM | POA: Diagnosis not present

## 2023-11-11 DIAGNOSIS — S91001D Unspecified open wound, right ankle, subsequent encounter: Secondary | ICD-10-CM | POA: Diagnosis not present

## 2023-11-16 DIAGNOSIS — R208 Other disturbances of skin sensation: Secondary | ICD-10-CM | POA: Diagnosis not present

## 2023-11-16 DIAGNOSIS — Z85828 Personal history of other malignant neoplasm of skin: Secondary | ICD-10-CM | POA: Diagnosis not present

## 2023-11-16 DIAGNOSIS — Z08 Encounter for follow-up examination after completed treatment for malignant neoplasm: Secondary | ICD-10-CM | POA: Diagnosis not present

## 2023-11-16 DIAGNOSIS — Z48817 Encounter for surgical aftercare following surgery on the skin and subcutaneous tissue: Secondary | ICD-10-CM | POA: Diagnosis not present

## 2023-11-16 DIAGNOSIS — R233 Spontaneous ecchymoses: Secondary | ICD-10-CM | POA: Diagnosis not present

## 2023-11-24 ENCOUNTER — Other Ambulatory Visit (HOSPITAL_COMMUNITY): Payer: Self-pay | Admitting: Family Medicine

## 2023-11-24 DIAGNOSIS — S91001D Unspecified open wound, right ankle, subsequent encounter: Secondary | ICD-10-CM

## 2023-11-26 ENCOUNTER — Ambulatory Visit (HOSPITAL_COMMUNITY)
Admission: RE | Admit: 2023-11-26 | Discharge: 2023-11-26 | Disposition: A | Discharge status: Home / Self Care | Source: Ambulatory Visit | Attending: Vascular Surgery | Admitting: Vascular Surgery

## 2023-11-26 DIAGNOSIS — S91001D Unspecified open wound, right ankle, subsequent encounter: Secondary | ICD-10-CM | POA: Insufficient documentation

## 2023-11-26 LAB — VAS US ABI WITH/WO TBI
Left ABI: 0.97
Right ABI: 0.73

## 2023-12-01 ENCOUNTER — Ambulatory Visit (INDEPENDENT_AMBULATORY_CARE_PROVIDER_SITE_OTHER): Admitting: Ophthalmology

## 2023-12-01 ENCOUNTER — Encounter (INDEPENDENT_AMBULATORY_CARE_PROVIDER_SITE_OTHER): Payer: Self-pay | Admitting: Ophthalmology

## 2023-12-01 DIAGNOSIS — Z961 Presence of intraocular lens: Secondary | ICD-10-CM

## 2023-12-01 DIAGNOSIS — H353221 Exudative age-related macular degeneration, left eye, with active choroidal neovascularization: Secondary | ICD-10-CM | POA: Diagnosis not present

## 2023-12-01 DIAGNOSIS — I10 Essential (primary) hypertension: Secondary | ICD-10-CM

## 2023-12-01 DIAGNOSIS — H353112 Nonexudative age-related macular degeneration, right eye, intermediate dry stage: Secondary | ICD-10-CM | POA: Diagnosis not present

## 2023-12-01 DIAGNOSIS — H35033 Hypertensive retinopathy, bilateral: Secondary | ICD-10-CM

## 2023-12-01 MED ORDER — BEVACIZUMAB CHEMO INJECTION 1.25MG/0.05ML SYRINGE FOR KALEIDOSCOPE
1.2500 mg | INTRAVITREAL | Status: AC | PRN
  Administered 2023-12-01: 1.25 mg via INTRAVITREAL

## 2023-12-01 NOTE — Progress Notes (Signed)
 [facility] - Clinic Note  12/01/2023     CHIEF COMPLAINT Patient presents for Retina Follow Up   HISTORY OF PRESENT ILLNESS: [name] is a 89 y.o. male who presents to the clinic today for:   HPI     Retina Follow Up   Patient presents with  Wet AMD.  In left eye.  This started 5 weeks ago.  I, the attending physician,  performed the HPI with the patient and updated documentation appropriately.        Comments   Patient here for 5 weeks retina follow up for exu ARMD OS. Patient states vision fairly good. Been doing alright. Not seeing spots like before. Sees them on occasion. Not using drops.       Last edited by Valdemar Rogue, MD on 12/01/2023  4:42 PM.     Patient states floaters have improved. Vision seems good OU.   Referring physician: Cleatus Collar, MD [address] [place],  [place] [address]  HISTORICAL INFORMATION:   Selected notes from the MEDICAL RECORD NUMBER Referred by Dr. Cleatus for concern of exu ARMD  LEE:  Ocular Hx- PMH-    CURRENT MEDICATIONS: No current outpatient medications on file. (Ophthalmic Drugs)   No current facility-administered medications for this visit. (Ophthalmic Drugs)   Current Outpatient Medications (Other)  Medication Sig   Ascorbic Acid (VITAMIN C PO) Take 1 tablet by mouth every Monday, Wednesday, and Friday. Takes along with vit e   aspirin  EC 81 MG tablet Take 1 tablet (81 mg total) by mouth daily. Swallow whole.   b complex vitamins capsule Take 1 capsule by mouth daily in the afternoon.   Calcium  Carb-Cholecalciferol (CALCIUM  600 + D PO) Take 1 tablet by mouth 3 (three) times a week.   Glucosamine HCl (GLUCOSAMINE PO) Take 1 tablet by mouth in the morning and at bedtime.   lisinopril -hydrochlorothiazide  (ZESTORETIC ) 20-12.5 MG tablet Take 1 tablet by mouth daily.   lisinopril -hydrochlorothiazide  (ZESTORETIC ) 20-25 MG tablet Take 1 tablet by mouth daily.   Multiple Vitamins-Minerals  (PRESERVISION AREDS PO) Take 1 tablet by mouth daily in the afternoon.   Omega-3 Fatty Acids (FISH OIL) 1000 MG CAPS Take 1,000 mg by mouth daily.   Potassium (POTASSIMIN PO) Take 1 tablet by mouth in the morning and at bedtime.   vitamin E 180 MG (400 UNITS) capsule Take 400 Units by mouth every Monday, Wednesday, and Friday.   No current facility-administered medications for this visit. (Other)   REVIEW OF SYSTEMS: ROS   Positive for: Eyes Negative for: Constitutional, Gastrointestinal, Neurological, Skin, Genitourinary, Musculoskeletal, HENT, Endocrine, Cardiovascular, Respiratory, Psychiatric, Allergic/Imm, Heme/Lymph Last edited by Orval Asberry RAMAN, COA on 12/01/2023  2:05 PM.       ALLERGIES Allergies  Allergen Reactions   Prednisone Other (See Comments)    Unknown    PAST MEDICAL HISTORY Past Medical History:  Diagnosis Date   Hypertension    Macular degeneration    Past Surgical History:  Procedure Laterality Date   CATARACT EXTRACTION     EYE SURGERY     ROTATOR CUFF REPAIR Bilateral    FAMILY HISTORY History reviewed. No pertinent family history.  SOCIAL HISTORY Social History   Tobacco Use   Smoking status: Never  Vaping Use   Vaping status: Unknown  Substance Use Topics   Alcohol use: No   Drug use: No       OPHTHALMIC EXAM:  Base Eye Exam  Visual Acuity (Snellen - Linear)       Right Left   Dist cc 20/30 -1 20/25 -1   Dist ph cc 20/25     Correction: Glasses         Tonometry (Tonopen, 2:02 PM)       Right Left   Pressure 20 14         Pupils       Dark Light Shape React APD   Right 3 2 Round Brisk None   Left 3 2 Round Brisk None         Visual Fields (Counting fingers)       Left Right    Full Full         Extraocular Movement       Right Left    Full, Ortho Full, Ortho         Neuro/Psych     Oriented x3: Yes   Mood/Affect: Normal         Dilation     Both eyes: 1.0% Mydriacyl, 2.5%  Phenylephrine @ 2:02 PM           Slit Lamp and Fundus Exam     Slit Lamp Exam       Right Left   Lids/Lashes Dermatochalasis - upper lid, mild MGD Dermatochalasis - upper lid, mild MGD   Conjunctiva/Sclera nasal pingeucula nasal pingeucula   Cornea arcus, trace Punctate epithelial erosions arcus, trace Punctate epithelial erosions   Anterior Chamber Deep and quiet Deep and quiet   Iris Round and poorly dilated Round and poorly dilated   Lens PC IOL in good position PC IOL in good position   Anterior Vitreous Vitreous syneresis, PVD Vitreous syneresis         Fundus Exam       Right Left   Disc mild Pallor, Sharp rim mild Pallor, Sharp rim, +PPA, Thin superior rim   C/D Ratio 0.5 0.6   Macula Flat, Blunted foveal reflex, Drusen, RPE mottling, clumping and central atrophy, No heme or edema Flat, Blunted foveal reflex, Drusen, RPE mottling and clumping, central PED and heme --improved, central cystic changes and edema, improved.   Vessels attenuated, Tortuous attenuated, mild tortuosity   Periphery Attached, mild reticular degeneration, mild peripheral drusen, No heme Attached, mild reticular degeneration, peripheral paving stone degeneration, No heme           Refraction     Wearing Rx       Sphere Cylinder Axis Add   Right -2.25 +3.25 180 +3.00   Left -1.25 +3.25 171 +3.00    Type: Bifocal           IMAGING AND PROCEDURES  Imaging and Procedures for 12/01/2023  OCT, Retina - OU - Both Eyes       Right Eye Quality was good. Central Foveal Thickness: 287. Progression has been stable. Findings include normal foveal contour, no SRF, retinal drusen , intraretinal fluid, outer retinal atrophy (Patchy ORA, trace cystic changes centrally---persistent ).   Left Eye Quality was good. Central Foveal Thickness: 239. Progression has improved. Findings include normal foveal contour, no IRF, no SRF, retinal drusen , subretinal hyper-reflective material, intraretinal  hyper-reflective material, pigment epithelial detachment, outer retinal atrophy (Stable improvement in central PEDs and SRHM; interval improvement in central cyst , patchy ORA).   Notes *Images captured and stored on drive  Diagnosis / Impression:  OD: Nonexudative ARMD - Patchy ORA, trace cystic changes centrally-persistent OS: ex ARMD - Stable improvement  in central PEDs and SRHM; interval improvement in central cyst , patchy ORA  Clinical management:  See below  Abbreviations: NFP - Normal foveal profile. CME - cystoid macular edema. PED - pigment epithelial detachment. IRF - intraretinal fluid. SRF - subretinal fluid. EZ - ellipsoid zone. ERM - epiretinal membrane. ORA - outer retinal atrophy. ORT - outer retinal tubulation. SRHM - subretinal hyper-reflective material. IRHM - intraretinal hyper-reflective material      Intravitreal Injection, Pharmacologic Agent - OS - Left Eye       Time Out 12/01/2023. 3:26 PM. Confirmed correct patient, procedure, site, and patient consented.   Anesthesia Topical anesthesia was used. Anesthetic medications included Lidocaine 2%, Proparacaine 0.5%.   Procedure Preparation included 5% betadine to ocular surface, eyelid speculum. A (32g) needle was used.   Injection: 1.25 mg Bevacizumab  1.25mg /0.20ml   Route: Intravitreal, Site: Left Eye   NDC: [id number], Lot: [id number], Expiration date: 02/08/2024   Post-op Post injection exam found visual acuity of at least counting fingers. The patient tolerated the procedure well. There were no complications. The patient received written and verbal post procedure care education.             ASSESSMENT/PLAN:    ICD-10-CM   1. Exudative age-related macular degeneration of left eye with active choroidal neovascularization (HCC)  H35.3221 OCT, Retina - OU - Both Eyes    Intravitreal Injection, Pharmacologic Agent - OS - Left Eye    Bevacizumab  (AVASTIN ) SOLN 1.25 mg    2. Intermediate stage  nonexudative age-related macular degeneration of right eye  H35.3112     3. Essential hypertension  I10     4. Hypertensive retinopathy of both eyes  H35.033     5. Pseudophakia, both eyes  Z96.1       Exudative age related macular degeneration, left eye    - interval conversion to exu ARMD OS on 01.22.25 exam -- new heme on exam, BCVA OS 20/30 from 20/20 - s/p IVA OS #1 (01.22.25), #2 (02.18.25), #3 (03.18.25), #4 (04.15.25), #5 (05.13.25), #6 (06.18.25)  - today BCVA OS stable at 20/25 - OCT OS Stable improvement in central PEDs and SRHM; interval improvement in central cyst , patchy ORA at 5 wks  - recommend IVA OS #7 today, 07.22.25 w/ f/u ext to 6 wks   - pt wishes to be treated with IVA  - RBA of procedure discussed, questions answered - IVA informed consent obtained and signed, 01.22.25, - see procedure note - Eylea approved for 2025 -- no funding for Good Days available  - f/u in 6 wks -- DFE/OCT, possible injxn  2. Age related macular degeneration, non-exudative, right eye  - intermediate stage w/ no IRF/SRF OU -- stable - OCT shows OD: Patchy ORA, trace cystic changes; OS: Central PEDs and patchy ORA; focal cystic changes centrally  - The incidence, anatomy, and pathology of dry AMD, risk of progression, and the AREDS and AREDS 2 study including smoking risks discussed with patient.  - Recommend amsler grid monitoring  - monitor  3,4. Hypertensive retinopathy OU - discussed importance of tight BP control - monitor  5. Pseudophakia OU  - s/p CE/IOL OU  - IOLs in good position, doing well  - monitor  Ophthalmic Meds Ordered this visit:  Meds ordered this encounter  Medications   Bevacizumab  (AVASTIN ) SOLN 1.25 mg     Return in about 6 weeks (around 01/12/2024) for ex ARMD OS - DFE, OCT, Possible Injxn.  There are no Patient  Instructions on file for this visit.   Explained the diagnoses, plan, and follow up with the patient and they expressed understanding.   Patient expressed understanding of the importance of proper follow up care.   This document serves as a record of services personally performed by Redell JUDITHANN Hans, MD, PhD. It was created on their behalf by Auston Muzzy, COMT. The creation of this record is the provider's dictation and/or activities during the visit.  Electronically signed by: Auston Muzzy, COMT 12/01/23 4:48 PM  Redell JUDITHANN Hans, M.D., Ph.D. Diseases & Surgery of the Retina and Vitreous [facility]  I have reviewed the above documentation for accuracy and completeness, and I agree with the above. Redell JUDITHANN Hans, M.D., Ph.D. 12/01/23 4:50 PM   Abbreviations: M myopia (nearsighted); A astigmatism; H hyperopia (farsighted); P presbyopia; Mrx spectacle prescription;  CTL contact lenses; OD right eye; OS left eye; OU both eyes  XT exotropia; ET esotropia; PEK punctate epithelial keratitis; PEE punctate epithelial erosions; DES dry eye syndrome; MGD meibomian gland dysfunction; ATs artificial tears; PFAT's preservative free artificial tears; NSC nuclear sclerotic cataract; PSC posterior subcapsular cataract; ERM epi-retinal membrane; PVD posterior vitreous detachment; RD retinal detachment; DM diabetes mellitus; DR diabetic retinopathy; NPDR non-proliferative diabetic retinopathy; PDR proliferative diabetic retinopathy; CSME clinically significant macular edema; DME diabetic macular edema; dbh dot blot hemorrhages; CWS cotton wool spot; POAG primary open angle glaucoma; C/D cup-to-disc ratio; HVF humphrey visual field; GVF goldmann visual field; OCT optical coherence tomography; IOP intraocular pressure; BRVO Branch retinal vein occlusion; CRVO central retinal vein occlusion; CRAO central retinal artery occlusion; BRAO branch retinal artery occlusion; RT retinal tear; SB scleral buckle; PPV pars plana vitrectomy; VH Vitreous hemorrhage; PRP panretinal laser photocoagulation; IVK intravitreal kenalog; VMT vitreomacular  traction; MH Macular hole;  NVD neovascularization of the disc; NVE neovascularization elsewhere; AREDS age related eye disease study; ARMD age related macular degeneration; POAG primary open angle glaucoma; EBMD epithelial/anterior basement membrane dystrophy; ACIOL anterior chamber intraocular lens; IOL intraocular lens; PCIOL posterior chamber intraocular lens; Phaco/IOL phacoemulsification with intraocular lens placement; PRK photorefractive keratectomy; LASIK laser assisted in situ keratomileusis; HTN hypertension; DM diabetes mellitus; COPD chronic obstructive pulmonary disease

## 2023-12-07 ENCOUNTER — Other Ambulatory Visit: Payer: Self-pay | Admitting: Interventional Radiology

## 2023-12-07 ENCOUNTER — Ambulatory Visit
Admission: RE | Admit: 2023-12-07 | Discharge: 2023-12-07 | Disposition: A | Discharge status: Home / Self Care | Source: Ambulatory Visit | Attending: Interventional Radiology

## 2023-12-07 DIAGNOSIS — L97921 Non-pressure chronic ulcer of unspecified part of left lower leg limited to breakdown of skin: Secondary | ICD-10-CM | POA: Diagnosis not present

## 2023-12-07 DIAGNOSIS — I739 Peripheral vascular disease, unspecified: Secondary | ICD-10-CM

## 2023-12-07 DIAGNOSIS — I10 Essential (primary) hypertension: Secondary | ICD-10-CM | POA: Diagnosis not present

## 2023-12-07 DIAGNOSIS — G629 Polyneuropathy, unspecified: Secondary | ICD-10-CM | POA: Diagnosis not present

## 2023-12-07 MED ORDER — IOPAMIDOL (ISOVUE-370) INJECTION 76%
100.0000 mL | Freq: Once | INTRAVENOUS | Status: AC | PRN
  Administered 2023-12-07: 100 mL via INTRAVENOUS

## 2023-12-10 NOTE — Progress Notes (Shared)
 Chief Complaint: Patient was seen in consultation today for non-healing right lower extremity wound   Referring Physician(s): Self-Referral   History of Present Illness: [name] is an 89 y.o. male with a medical history significant for HTN, stroke and macular degeneration. He also has a history of a chronic right  posterior ankle wound that took approximately 2 years to heal, first noticed in 2022. He presented to his dermatologist in May 2025 for skin assessment and a biopsy was taken from a right lateral ankle lesion.  This resulted in severe pain since, and the cutaneous defect has increased in size by approximately 5x per the patient's wife, who primarily takes care of the wound.  He was receiving care at the wound care clinic, however given her prior experience with his other wound, she provides twice a day dressing changes.  He is very careful to keep the wound clean. The pain from the wound is excruciating, and has severely affected his sleeping patterns as well as his mobility and ability to participate in chores around his property where there is an events center and [place].    His nerve pain has been treated with Lyrica with some mild improvements. His PCP prescribed a course of antibiotics and ordered ABIs to assess blood flow. The ABI study was completed 11/26/23 and this showed moderate right lower extremity arterial disease, however monophasic waveforms bilaterally and calcifications making the ABI measurements erroneous.    We ordered a CTA runoff which was completed on 12/07/23.    He reports a history of mild hyperlipidemia but refuses cholesterol lowering medication.  He takes 81 mg aspirin  daily.  He has a remote smoking history.  He has hypertension, well controlled on single antihypertensive.  He was remaining very active on his property prior to the development of this wound. No fevers or chills. No specific cramping when ambulating.   Past Medical History:   Diagnosis Date   Hypertension    Macular degeneration     Past Surgical History:  Procedure Laterality Date   CATARACT EXTRACTION     EYE SURGERY     ROTATOR CUFF REPAIR Bilateral     Allergies: Prednisone  Medications: Prior to Admission medications   Medication Sig Start Date End Date Taking? Authorizing Provider  Ascorbic Acid (VITAMIN C PO) Take 1 tablet by mouth every Monday, Wednesday, and Friday. Takes along with vit e    [provider]  aspirin  EC 81 MG tablet Take 1 tablet (81 mg total) by mouth daily. Swallow whole. 06/19/22   Ghimire, [name], MD  b complex vitamins capsule Take 1 capsule by mouth daily in the afternoon.    [provider]  Calcium  Carb-Cholecalciferol (CALCIUM  600 + D PO) Take 1 tablet by mouth 3 (three) times a week.    [provider]  Glucosamine HCl (GLUCOSAMINE PO) Take 1 tablet by mouth in the morning and at bedtime.    [provider]  lisinopril -hydrochlorothiazide  (ZESTORETIC ) 20-12.5 MG tablet Take 1 tablet by mouth daily. 06/19/22   Ghimire, [name], MD  lisinopril -hydrochlorothiazide  (ZESTORETIC ) 20-25 MG tablet Take 1 tablet by mouth daily.    [provider]  Multiple Vitamins-Minerals (PRESERVISION AREDS PO) Take 1 tablet by mouth daily in the afternoon.    [provider]  Omega-3 Fatty Acids (FISH OIL) 1000 MG CAPS Take 1,000 mg by mouth daily.    [provider]  Potassium (POTASSIMIN PO) Take 1 tablet by mouth in the  morning and at bedtime.    [provider]  vitamin E 180 MG (400 UNITS) capsule Take 400 Units by mouth every Monday, Wednesday, and Friday.    [provider]     No family history on file.  Social History   Socioeconomic History   Marital status: Married    Spouse name: Not on file   Number of children: Not on file   Years of education: Not on file   Highest education level: Not on file  Occupational History   Not on file   Tobacco Use   Smoking status: Never   Smokeless tobacco: Not on file  Vaping Use   Vaping status: Unknown  Substance and Sexual Activity   Alcohol use: No   Drug use: No   Sexual activity: Not on file  Other Topics Concern   Not on file  Social History Narrative   Not on file   Social Drivers of Health   Financial Resource Strain: Not on file  Food Insecurity: Not on file  Transportation Needs: Not on file  Physical Activity: Not on file  Stress: Not on file  Social Connections: Not on file     Review of Systems: A 12 point ROS discussed and pertinent positives are indicated in the HPI above.  All other systems are negative.  Vital Signs: There were no vitals taken for this visit.  Physical Exam Constitutional:      General: He is not in acute distress. HENT:     Head: Normocephalic.     Mouth/Throat:     Mouth: Mucous membranes are moist.  Eyes:     General: No scleral icterus. Cardiovascular:     Rate and Rhythm: Normal rate and regular rhythm.  Pulmonary:     Effort: Pulmonary effort is normal. No respiratory distress.  Abdominal:     General: There is no distension.  Skin:        Comments: Approximately 5 cm wound with underlying muscle/tendon exposed.    Neurological:     Mental Status: He is alert.     Imaging: ABI 11/26/23 BI Findings:  +---------+------------------+-----+----------+--------+  Right   Rt Pressure (mmHg)IndexWaveform  Comment   +---------+------------------+-----+----------+--------+  Brachial 154                                        +---------+------------------+-----+----------+--------+  ATA     95                0.62 monophasic          +---------+------------------+-----+----------+--------+  PTA     113               0.73 monophasic          +---------+------------------+-----+----------+--------+  Great Toe36                0.23                      +---------+------------------+-----+----------+--------+   +---------+------------------+-----+----------+-------+  Left    Lt Pressure (mmHg)IndexWaveform  Comment  +---------+------------------+-----+----------+-------+  Brachial 154                                       +---------+------------------+-----+----------+-------+  ATA     148  0.96 monophasic         +---------+------------------+-----+----------+-------+  PTA     150               0.97 monophasic         +---------+------------------+-----+----------+-------+  Great Toe71                0.46                    +---------+------------------+-----+----------+-------+   +-------+-----------+-----------+------------+------------+  ABI/TBIToday's ABIToday's TBIPrevious ABIPrevious TBI  +-------+-----------+-----------+------------+------------+  Right 0.73       0.23       1.12        0.65          +-------+-----------+-----------+------------+------------+  Left  0.97       0.48       1.13        0.52          +-------+-----------+-----------+------------+------------+        Previous ABI on 07/07/20 at [facility] imaging.    Summary:  Right: Resting right ankle-brachial index indicates moderate right lower  extremity arterial disease. The right toe-brachial index is abnormal.   Left: The left toe-brachial index is abnormal.  Although ankle brachial indices are within normal limits (0.95-1.29),  arterial Doppler waveforms at the ankle suggest some component of arterial  occlusive disease.    CTA Runoff (12/07/23)  Right common iliac artery plaque, ~50% stenosis   Tibioperoneal trunk short segment occlusion, severe multifocal stenoses of the AT  Labs:  CBC: No results for input(s): WBC, HGB, HCT, PLT in the last 8760 hours.  COAGS: No results for input(s): INR, APTT in the last 8760 hours.  BMP: No results for input(s): NA, K, CL,  CO2, GLUCOSE, BUN, CALCIUM , CREATININE, GFRNONAA, GFRAA in the last 8760 hours.  Invalid input(s): CMP  LIVER FUNCTION TESTS: No results for input(s): BILITOT, AST, ALT, ALKPHOS, PROT, ALBUMIN in the last 8760 hours.  TUMOR MARKERS: No results for input(s): AFPTM, CEA, CA199, CHROMGRNA in the last 8760 hours.  Assessment and Plan: 89 year old male with a medical history significant for a non-healing right ankle wound present since May 2025 with associated right lower extremity peripheral artery disease, specifically a 50% stenosis in the right common iliac artery and a short segment occlusion in the right tibioperoneal trunk.    We discussed the gravity of his enlarging wound and necessity of revascularization to prevent osteomyelitis and amputation.  We discussed techniques to address both the iliac and TPT lesions, including placement of a covered stent in the right common iliac artery and revascularizing the right TPT occlusion to improve flow to the peroneal artery angiosome lesion on the lateral ankle.  These would be staged procedures, starting with the inflow then addressing the outflow.  We discussed initiating dual antiplatelet therapy.    He and his wife are amenable, all questions answered.  Plan for two staged procedures, planned approximately 1-2 weeks apart at [facility] with moderate sedation and same day discharge home: #1 - Right common iliac artery stent placement with right lower extremity angiogram (retrograde right femoral artery access) #2 - Right lower extremity angiogram with tibioperoneal trunk revascularization (antegrade right femoral artery access)  Thank you for this interesting consult.  I greatly enjoyed meeting [name] and look forward to participating in their care.  A copy of this report was sent to the requesting provider on this date.  Ester Sides, MD Pager: [phone]  I spent a total of  40  Minutes   in face to face in clinical consultation, greater than 50% of which was counseling/coordinating care for non-healing lower extremity wound.

## 2023-12-11 ENCOUNTER — Ambulatory Visit
Admission: RE | Admit: 2023-12-11 | Discharge: 2023-12-11 | Disposition: A | Discharge status: Home / Self Care | Source: Ambulatory Visit | Attending: Interventional Radiology | Admitting: Interventional Radiology

## 2023-12-11 DIAGNOSIS — L97319 Non-pressure chronic ulcer of right ankle with unspecified severity: Secondary | ICD-10-CM | POA: Diagnosis not present

## 2023-12-11 DIAGNOSIS — I739 Peripheral vascular disease, unspecified: Secondary | ICD-10-CM

## 2023-12-11 DIAGNOSIS — I7025 Atherosclerosis of native arteries of other extremities with ulceration: Secondary | ICD-10-CM | POA: Diagnosis not present

## 2023-12-11 HISTORY — PX: IR RADIOLOGIST EVAL & MGMT: IMG5224

## 2023-12-16 ENCOUNTER — Telehealth (HOSPITAL_COMMUNITY): Payer: Self-pay | Admitting: Radiology

## 2023-12-16 NOTE — Telephone Encounter (Signed)
 Called pt to schedule PAD tx for lower extremity non-healing wound. Left VM for him to call me back to schedule. JM

## 2023-12-17 ENCOUNTER — Other Ambulatory Visit (HOSPITAL_COMMUNITY): Payer: Self-pay | Admitting: Interventional Radiology

## 2023-12-17 DIAGNOSIS — I739 Peripheral vascular disease, unspecified: Secondary | ICD-10-CM

## 2023-12-18 ENCOUNTER — Other Ambulatory Visit: Payer: Self-pay | Admitting: *Deleted

## 2023-12-18 DIAGNOSIS — I739 Peripheral vascular disease, unspecified: Secondary | ICD-10-CM

## 2023-12-23 NOTE — H&P (Addendum)
 Chief Complaint: Patient was seen in consultation today for non-healing right lower extremity wound.   Referring Physician(s): Self-Referral   Supervising Physician: Jennefer Rover  Patient Status: [facility] - Out-pt  History of Present Illness: [name] is an 89 y.o. male with a medical history significant for HTN, stroke and macular degeneration. He also has a history of a chronic right posterior ankle wound that took approximately 2 years to heal, first noticed in 2022. He presented to his dermatologist in May 2025 for skin assessment and a biopsy was taken from a right lateral ankle lesion. This resulted in severe pain since, and the cutaneous defect has increased in size by approximately 5x per the patient's wife, who primarily takes care of the wound. He was receiving care at the wound care clinic, however given her prior experience with his other wound, she provides twice a day dressing changes. He is very careful to keep the wound clean. The pain from the wound is excruciating, and has severely affected his sleeping patterns as well as his mobility and ability to participate in chores around his property where there is an events center and [place].   His nerve pain has been treated with Lyrica with some mild improvements. His PCP prescribed a course of antibiotics and ordered ABIs to assess blood flow. The ABI study was completed 11/26/23 and this showed moderate right lower extremity arterial disease, however monophasic waveforms bilaterally and calcifications making the ABI measurements erroneous.     He reached out to Dr. Jennefer to discuss possible treatment options and a CTA runoff was ordered. This was completed on 12/07/23 and the patient and his wife met with Dr. Jennefer in formal consultation 12/11/23. He discussed the findings of the CTA study which showed a 50% stenosis in the right common iliac artery and a short segment occlusion in the right tibioperoneal trunk.   They  discussed the gravity of his enlarging wound and necessity of revascularization to prevent osteomyelitis and amputation. Dr. Jennefer discussed techniques to address both the iliac and TPT lesions, including placement of a covered stent in the right common iliac artery and revascularizing the right TPT occlusion to improve flow to the peroneal artery angiosome lesion on the lateral ankle. He informed the patient and his wife that these would be staged procedures, starting with the inflow then addressing the outflow. Dr. Jennefer also discussed initiating dual antiplatelet therapy.  The patient and his wife expressed a desire to proceed. The patient presents today for Stage 1 - right common iliac artery stent placement with right lower extremity angiogram (retrograde right femoral artery access).     Past Medical History:  Diagnosis Date   Hypertension    Macular degeneration     Past Surgical History:  Procedure Laterality Date   CATARACT EXTRACTION     EYE SURGERY     IR RADIOLOGIST EVAL & MGMT  12/11/2023   ROTATOR CUFF REPAIR Bilateral     Allergies: Prednisone  Medications: Prior to Admission medications   Medication Sig Start Date End Date Taking? Authorizing Provider  Ascorbic Acid (VITAMIN C PO) Take 1 tablet by mouth every Monday, Wednesday, and Friday. Takes along with vit e    [provider]  aspirin  EC 81 MG tablet Take 1 tablet (81 mg total) by mouth daily. Swallow whole. 06/19/22   Ghimire, [name], MD  b complex vitamins capsule Take 1 capsule by mouth daily in the afternoon.    [provider]  Calcium  Carb-Cholecalciferol (CALCIUM  600 + D PO) Take 1 tablet by mouth 3 (three) times a week.    [provider]  Glucosamine HCl (GLUCOSAMINE PO) Take 1 tablet by mouth in the morning and at bedtime.    [provider]  lisinopril -hydrochlorothiazide  (ZESTORETIC ) 20-12.5 MG tablet Take 1 tablet by mouth daily. 06/19/22   Ghimire, [name], MD   lisinopril -hydrochlorothiazide  (ZESTORETIC ) 20-25 MG tablet Take 1 tablet by mouth daily.    [provider]  Multiple Vitamins-Minerals (PRESERVISION AREDS PO) Take 1 tablet by mouth daily in the afternoon.    [provider]  Omega-3 Fatty Acids (FISH OIL) 1000 MG CAPS Take 1,000 mg by mouth daily.    [provider]  Potassium (POTASSIMIN PO) Take 1 tablet by mouth in the morning and at bedtime.    [provider]  vitamin E 180 MG (400 UNITS) capsule Take 400 Units by mouth every Monday, Wednesday, and Friday.    [provider]     No family history on file.  Social History   Socioeconomic History   Marital status: Married    Spouse name: Not on file   Number of children: Not on file   Years of education: Not on file   Highest education level: Not on file  Occupational History   Not on file  Tobacco Use   Smoking status: Never   Smokeless tobacco: Not on file  Vaping Use   Vaping status: Unknown  Substance and Sexual Activity   Alcohol use: No   Drug use: No   Sexual activity: Not on file  Other Topics Concern   Not on file  Social History Narrative   Not on file   Social Drivers of Health   Financial Resource Strain: Not on file  Food Insecurity: Not on file  Transportation Needs: Not on file  Physical Activity: Not on file  Stress: Not on file  Social Connections: Not on file    Review of Systems: A 12 point ROS discussed and pertinent positives are indicated in the HPI above.  All other systems are negative.  Review of Systems  Skin:  Positive for wound.       Right ankle wound.   All other systems reviewed and are negative.   Vital Signs: BP (!) 155/92   Pulse 75   Resp 17   Ht 5' 2 (1.575 m)   Wt 125 lb (56.7 kg)   SpO2 97%   BMI 22.86 kg/m   Physical Exam Constitutional:      General: He is not in acute distress.    Appearance: He is not ill-appearing.  HENT:     Ears:     Comments: Hard of  hearing    Mouth/Throat:     Mouth: Mucous membranes are moist.     Pharynx: Oropharynx is clear.  Cardiovascular:     Rate and Rhythm: Normal rate.  Pulmonary:     Effort: Pulmonary effort is normal.  Abdominal:     Tenderness: There is no abdominal tenderness.  Skin:    General: Skin is warm and dry.     Findings: Wound present.         Comments: Right ankle wound. Approximately 4-5 cm, wound bed is moist with yellow slough.   Neurological:     Mental Status: He is alert and oriented to person, place, and time.           Imaging: ABI 11/26/23  BI Findings:  +---------+------------------+-----+----------+--------+  Right   Rt Pressure (mmHg)IndexWaveform  Comment   +---------+------------------+-----+----------+--------+  Brachial 154                                        +---------+------------------+-----+----------+--------+  ATA     95                0.62 monophasic          +---------+------------------+-----+----------+--------+  PTA     113               0.73 monophasic          +---------+------------------+-----+----------+--------+  Great Toe36                0.23                     +---------+------------------+-----+----------+--------+   +---------+------------------+-----+----------+-------+  Left    Lt Pressure (mmHg)IndexWaveform  Comment  +---------+------------------+-----+----------+-------+  Brachial 154                                       +---------+------------------+-----+----------+-------+  ATA     148               0.96 monophasic         +---------+------------------+-----+----------+-------+  PTA     150               0.97 monophasic         +---------+------------------+-----+----------+-------+  Great Toe71                0.46                    +---------+------------------+-----+----------+-------+   +-------+-----------+-----------+------------+------------+   ABI/TBIToday's ABIToday's TBIPrevious ABIPrevious TBI  +-------+-----------+-----------+------------+------------+  Right 0.73       0.23       1.12        0.65          +-------+-----------+-----------+------------+------------+  Left  0.97       0.48       1.13        0.52          +-------+-----------+-----------+------------+------------+   Previous ABI on 07/07/20 at [facility] imaging.  Summary:  Right: Resting right ankle-brachial index indicates moderate right lower  extremity arterial disease. The right toe-brachial index is abnormal.   Left: The left toe-brachial index is abnormal.  Although ankle brachial indices are within normal limits (0.95-1.29),  arterial Doppler waveforms at the ankle suggest some component of arterial  occlusive disease.     CTA Runoff (12/07/23)  Right common iliac artery plaque, ~50% stenosis    Tibioperoneal trunk short segment occlusion, severe multifocal stenoses of the AT  Labs:  CBC: Recent Labs    12/24/23 0847  WBC 8.7  HGB 12.5*  HCT 37.7*  PLT 306    COAGS: Recent Labs    12/24/23 0847  INR 1.0    BMP: Recent Labs    12/24/23 0847  NA 141  K 4.1  CL 106  CO2 30  GLUCOSE 98  BUN 28*  CALCIUM  8.9  CREATININE 1.02  GFRNONAA >60    LIVER FUNCTION TESTS: No results for input(s): BILITOT, AST, ALT, ALKPHOS, PROT, ALBUMIN in the last 8760 hours.  TUMOR MARKERS: No results for  input(s): AFPTM, CEA, CA199, CHROMGRNA in the last 8760 hours.  Assessment and Plan:  Right lower extremity peripheral artery disease with non-healing right ankle wound: [name], 89 year old male, presents today to the [facility] Interventional Radiology department for an image-guided right lower extremity angiogram with possible stent placement to the right common iliac artery.   Risks and benefits of this procedure were discussed with the patient including, but not limited to bleeding, infection,  vascular injury or contrast induced renal failure.  All of the patient's questions were answered, patient is agreeable to proceed. The patient has been NPO. He is a full code.   Consent signed and in chart.  Thank you for this interesting consult.  I greatly enjoyed meeting [name] and look forward to participating in their care.  A copy of this report was sent to the requesting provider on this date.  Electronically Signed: Warren Dais, AGACNP-BC 12/24/2023, 9:59 AM   I spent a total of  30 Minutes   in face to face in clinical consultation, greater than 50% of which was counseling/coordinating care for right lower extremity PAD with non-healing right ankle wound.

## 2023-12-24 ENCOUNTER — Other Ambulatory Visit (HOSPITAL_COMMUNITY): Payer: Self-pay | Admitting: Interventional Radiology

## 2023-12-24 ENCOUNTER — Other Ambulatory Visit: Payer: Self-pay

## 2023-12-24 ENCOUNTER — Other Ambulatory Visit (HOSPITAL_COMMUNITY): Payer: Self-pay

## 2023-12-24 ENCOUNTER — Ambulatory Visit (HOSPITAL_COMMUNITY)
Admission: RE | Admit: 2023-12-24 | Discharge: 2023-12-24 | Disposition: A | Discharge status: Home / Self Care | Source: Ambulatory Visit | Attending: Interventional Radiology | Admitting: Interventional Radiology

## 2023-12-24 VITALS — BP 146/85 | HR 63 | Resp 8 | Ht 62.0 in | Wt 125.0 lb

## 2023-12-24 DIAGNOSIS — Z79899 Other long term (current) drug therapy: Secondary | ICD-10-CM | POA: Diagnosis not present

## 2023-12-24 DIAGNOSIS — I10 Essential (primary) hypertension: Secondary | ICD-10-CM | POA: Insufficient documentation

## 2023-12-24 DIAGNOSIS — L97319 Non-pressure chronic ulcer of right ankle with unspecified severity: Secondary | ICD-10-CM | POA: Insufficient documentation

## 2023-12-24 DIAGNOSIS — Z8673 Personal history of transient ischemic attack (TIA), and cerebral infarction without residual deficits: Secondary | ICD-10-CM | POA: Insufficient documentation

## 2023-12-24 DIAGNOSIS — I70221 Atherosclerosis of native arteries of extremities with rest pain, right leg: Secondary | ICD-10-CM | POA: Diagnosis not present

## 2023-12-24 DIAGNOSIS — I739 Peripheral vascular disease, unspecified: Secondary | ICD-10-CM

## 2023-12-24 DIAGNOSIS — I70233 Atherosclerosis of native arteries of right leg with ulceration of ankle: Secondary | ICD-10-CM | POA: Insufficient documentation

## 2023-12-24 DIAGNOSIS — I708 Atherosclerosis of other arteries: Secondary | ICD-10-CM | POA: Insufficient documentation

## 2023-12-24 DIAGNOSIS — H3530 Unspecified macular degeneration: Secondary | ICD-10-CM | POA: Insufficient documentation

## 2023-12-24 HISTORY — PX: IR INTRAVASCULAR ULTRASOUND NON CORONARY: IMG6085

## 2023-12-24 HISTORY — PX: IR ANGIOGRAM EXTREMITY RIGHT: IMG652

## 2023-12-24 HISTORY — PX: IR ANGIOGRAM PELVIS SELECTIVE OR SUPRASELECTIVE: IMG661

## 2023-12-24 HISTORY — PX: IR ILIAC ART STENT INC PTA MOD SED: IMG2306

## 2023-12-24 HISTORY — PX: IR US GUIDE VASC ACCESS RIGHT: IMG2390

## 2023-12-24 LAB — CBC WITH DIFFERENTIAL/PLATELET
Abs Immature Granulocytes: 0.06 K/uL (ref 0.00–0.07)
Basophils Absolute: 0.1 K/uL (ref 0.0–0.1)
Basophils Relative: 1 %
Eosinophils Absolute: 0.3 K/uL (ref 0.0–0.5)
Eosinophils Relative: 3 %
HCT: 37.7 % — ABNORMAL LOW (ref 39.0–52.0)
Hemoglobin: 12.5 g/dL — ABNORMAL LOW (ref 13.0–17.0)
Immature Granulocytes: 1 %
Lymphocytes Relative: 10 %
Lymphs Abs: 0.8 K/uL (ref 0.7–4.0)
MCH: 30.7 pg (ref 26.0–34.0)
MCHC: 33.2 g/dL (ref 30.0–36.0)
MCV: 92.6 fL (ref 80.0–100.0)
Monocytes Absolute: 0.7 K/uL (ref 0.1–1.0)
Monocytes Relative: 8 %
Neutro Abs: 6.8 K/uL (ref 1.7–7.7)
Neutrophils Relative %: 77 %
Platelets: 306 K/uL (ref 150–400)
RBC: 4.07 MIL/uL — ABNORMAL LOW (ref 4.22–5.81)
RDW: 14.6 % (ref 11.5–15.5)
WBC: 8.7 K/uL (ref 4.0–10.5)
nRBC: 0 % (ref 0.0–0.2)

## 2023-12-24 LAB — BASIC METABOLIC PANEL WITH GFR
Anion gap: 5 (ref 5–15)
BUN: 28 mg/dL — ABNORMAL HIGH (ref 8–23)
CO2: 30 mmol/L (ref 22–32)
Calcium: 8.9 mg/dL (ref 8.9–10.3)
Chloride: 106 mmol/L (ref 98–111)
Creatinine, Ser: 1.02 mg/dL (ref 0.61–1.24)
GFR, Estimated: >60 mL/min (ref >60)
Glucose, Bld: 98 mg/dL (ref 70–99)
Potassium: 4.1 mmol/L (ref 3.5–5.1)
Sodium: 141 mmol/L (ref 135–145)

## 2023-12-24 LAB — PROTIME-INR
INR: 1 (ref 0.8–1.2)
Prothrombin Time: 13.7 s (ref 11.4–15.2)

## 2023-12-24 MED ORDER — CLOPIDOGREL BISULFATE 75 MG PO TABS
75.0000 mg | ORAL_TABLET | Freq: Every day | ORAL | 3 refills | Status: AC
  Filled 2023-12-24: qty 90, 90d supply, fill #0
  Filled 2023-12-24: qty 30, 30d supply, fill #0

## 2023-12-24 MED ORDER — HEPARIN SODIUM (PORCINE) 1000 UNIT/ML IJ SOLN
INTRAMUSCULAR | Status: AC | PRN
Start: 2023-12-24 — End: 2023-12-24
  Administered 2023-12-24: 3000 [IU] via INTRAVENOUS

## 2023-12-24 MED ORDER — CEFAZOLIN SODIUM-DEXTROSE 2-4 GM/100ML-% IV SOLN: 2.0000 g | INTRAVENOUS | Status: DC

## 2023-12-24 MED ORDER — MIDAZOLAM HCL 2 MG/2ML IJ SOLN
INTRAMUSCULAR | Status: AC
  Filled 2023-12-24: qty 2

## 2023-12-24 MED ORDER — LIDOCAINE-EPINEPHRINE 1 %-1:100000 IJ SOLN
2.0000 mL | Freq: Once | INTRAMUSCULAR | Status: AC
  Administered 2023-12-24: 2 mL via INTRADERMAL

## 2023-12-24 MED ORDER — MIDAZOLAM HCL 2 MG/2ML IJ SOLN
INTRAMUSCULAR | Status: AC | PRN
  Administered 2023-12-24: .5 mg via INTRAVENOUS
  Administered 2023-12-24: 1 mg via INTRAVENOUS

## 2023-12-24 MED ORDER — FENTANYL CITRATE (PF) 100 MCG/2ML IJ SOLN
INTRAMUSCULAR | Status: AC | PRN
  Administered 2023-12-24 (×2): 25 ug via INTRAVENOUS

## 2023-12-24 MED ORDER — CLOPIDOGREL BISULFATE 75 MG PO TABS
300.0000 mg | ORAL_TABLET | Freq: Once | ORAL | Status: AC
  Administered 2023-12-24: 300 mg via ORAL
  Filled 2023-12-24: qty 4

## 2023-12-24 MED ORDER — FENTANYL CITRATE (PF) 100 MCG/2ML IJ SOLN
INTRAMUSCULAR | Status: AC
  Filled 2023-12-24: qty 2

## 2023-12-24 MED ORDER — SODIUM CHLORIDE 0.9 % IV SOLN: INTRAVENOUS | Status: DC

## 2023-12-24 MED ORDER — LIDOCAINE-EPINEPHRINE 1 %-1:100000 IJ SOLN
INTRAMUSCULAR | Status: AC
  Filled 2023-12-24: qty 1

## 2023-12-24 MED ORDER — HEPARIN SODIUM (PORCINE) 1000 UNIT/ML IJ SOLN
INTRAMUSCULAR | Status: AC
  Filled 2023-12-24: qty 10

## 2023-12-24 MED ORDER — IODIXANOL 320 MG/ML IV SOLN
200.0000 mL | Freq: Once | INTRAVENOUS | Status: AC | PRN
  Administered 2023-12-24: 135 mL via INTRA_ARTERIAL

## 2023-12-24 NOTE — Discharge Instructions (Signed)
 Femoral Site Care This sheet gives you information about how to care for yourself after your procedure. Your health care provider may also give you more specific instructions. If you have problems or questions, contact your health care provider. What can I expect after the procedure?  After the procedure, it is common to have: Bruising that usually fades within 1-2 weeks. Tenderness at the site. Follow these instructions at home: Wound care Follow instructions from your health care provider about how to take care of your insertion site. Make sure you: Wash your hands with soap and water before you change your bandage (dressing). If soap and water are not available, use hand sanitizer. Remove your dressing as told by your health care provider. In 24 hours Do not take baths, swim, or use a hot tub until your health care provider approves. You may shower 24-48 hours after the procedure or as told by your health care provider. Gently wash the site with plain soap and water. Pat the area dry with a clean towel. Do not rub the site. This may cause bleeding. Do not apply powder or lotion to the site. Keep the site clean and dry. Check your femoral site every day for signs of infection. Check for: Redness, swelling, or pain. Fluid or blood. Warmth. Pus or a bad smell. Activity For the first 2-3 days after your procedure, or as long as directed: Avoid climbing stairs as much as possible. Do not squat. Do not lift anything that is heavier than 10 lb (4.5 kg), or the limit that you are told, until your health care provider says that it is safe. For 5 days Rest as directed. Avoid sitting for a long time without moving. Get up to take short walks every 1-2 hours. Do not drive for 24 hours if you were given a medicine to help you relax (sedative). General instructions Take over-the-counter and prescription medicines only as told by your health care provider. Keep all follow-up visits as told by  your health care provider. This is important. Contact a health care provider if you have: A fever or chills. You have redness, swelling, or pain around your insertion site. Get help right away if: The catheter insertion area swells very fast. You pass out. You suddenly start to sweat or your skin gets clammy. The catheter insertion area is bleeding, and the bleeding does not stop when you hold steady pressure on the area. The area near or just beyond the catheter insertion site becomes pale, cool, tingly, or numb. These symptoms may represent a serious problem that is an emergency. Do not wait to see if the symptoms will go away. Get medical help right away. Call your local emergency services (911 in the [place] Do not drive yourself to the hospital. Summary After the procedure, it is common to have bruising that usually fades within 1-2 weeks. Check your femoral site every day for signs of infection. Do not lift anything that is heavier than 10 lb (4.5 kg), or the limit that you are told, until your health care provider says that it is safe. This information is not intended to replace advice given to you by your health care provider. Make sure you discuss any questions you have with your health care provider. Document Revised: 05/11/2017 Document Reviewed: 05/11/2017 Elsevier Patient Education  2020 [employer].

## 2023-12-24 NOTE — Procedures (Signed)
 Interventional Radiology Procedure Note  Procedure:  1) Right lower extremity angiogram 2) Right common iliac artery stent placement   Findings: Please refer to procedural dictation for full description. 11 mm x 29 mm VBX.  8 Fr right CFA access, 8 Fr Angioseal closure.  Complications: None immediate  Estimated Blood Loss: < 5 mL  Recommendations: 1 hour flat, 2 hours head of bed up to 30 degrees for total of 3 hours bedrest. Loading dose Plavix  prior to discharge, initiate 75 mg daily starting tomorrow. Follow up on 8/27 for repeat angiogram and possible tibial recan.   Ester Sides, MD

## 2023-12-24 NOTE — Progress Notes (Signed)
 IV removed, pt escorted from the unit via wheelchair to personal vehicle driven by his wife.

## 2023-12-24 NOTE — Progress Notes (Signed)
 PT ambulated with the nurse to the bathroom where he was able to void without difficulty. Discharge instructions reviewed with patient and wife at bedside. Denies questions or concerns at this time. Incision site remains C/D/I, no s/s of complications.

## 2023-12-24 NOTE — Sedation Documentation (Signed)
 ACT 107, 3000u of heparin  verbal order

## 2023-12-24 NOTE — Progress Notes (Signed)
 Patient will begin taking Plavix  75 mg daily starting tomorrow. This prescription was sent to the [facility]. I spoke with the patient's wife regarding the next phase of the patient's treatment and this will take place on August 27 at 9 am. The patient's wife knows to arrive at 0730 and to be NPO after midnight. She has the number to our scheduler to call if she needs to reschedule.   Daneesha Quinteros, AGACNP-BC 12/24/2023, 11:56 AM

## 2023-12-31 LAB — POCT ACTIVATED CLOTTING TIME
Activated Clotting Time: 0 s
Activated Clotting Time: 107 s

## 2024-01-05 ENCOUNTER — Other Ambulatory Visit: Payer: Self-pay | Admitting: Radiology

## 2024-01-05 DIAGNOSIS — I998 Other disorder of circulatory system: Secondary | ICD-10-CM

## 2024-01-05 NOTE — H&P (Signed)
 Chief Complaint: Right common iliac artery stenosis. Patient presents for right lower extremity angiogram with possible right common iliac artery stent placement.    Referring Physician(s): Self - referral   Supervising Physician: Jennefer Rover  Patient Status: [facility] - Out-pt  History of Present Illness: [name] is a 89 y.o. male. History of HTN, CVA and  macular degeneration. Found to have a non healing right posterior ankle wound on 202 and a enlarging right lateral ankle wound from a biopsy performed at the dermatologists office in May 2025.  Since that time [name] has been receiving wound care initially at the wound care clinic and now provided by his wife. He describes that pain as excruciating and states that it has affected his mobility and his ability to sleep.  Pain is made slightly better with Lyrica. Antibiotics have no affect. An ABI performed on 7.17.25 showed moderate right lower extremity arterial disease, however monophasic waveforms bilaterally and calcifications making the ABI measurements erroneous.  CTA of abdominal aorta with iliofemoral runoff from 8.1.25 reads  Approximately 50% focal stenosis about the right common iliac artery secondary to atherosclerotic plaque. Short segment occlusion of the tibioperoneal trunk with distal reconstitution. Likely occluded anterior tibial artery, limited evaluation due to extensive atherosclerotic calcifications however the dorsalis pedis appears patent at the level of the ankle.   The patient was seen for consultation in the Interventional Radiology Clinic  on 7.28.25 with IR Attending Dr. Rover Jennefer . At that time a detailed discussion regarding the Patient's medical condition including but not limited to possible treatment options took place. Following that discussion the Patient and his wife elected to proceed with staged approach to recanalization.  [name] was also started on dual antiplatelet therapy at that time. On  8.14.25 step 1 occurred. A right lower angiogram was performed and a right common iliac covered balloon expandable stent (VBX, 11 mm x 29 mm)was placed.  The Patient presents today for stage 2. A repeat angiogram with possible tibial recanalization.  Wife at bedside. Currently without any significant complaints. Patient alert and laying in bed,calm. Denies any fevers, headache, chest pain, SOB, cough, abdominal pain, nausea, vomiting or bleeding.    Labs pending. Patient is on  ASA and prednisone.  Allergies include prednisone. Patient has been NPO since midnight.  Return precautions and treatment recommendations and follow-up discussed with the patient and his wife. Both who are agreeable with the plan.   Return precautions and treatment recommendations and follow-up discussed with the patient and his wife. Both who are agreeable with the plan.    Past Medical History:  Diagnosis Date   Hypertension    Macular degeneration     Past Surgical History:  Procedure Laterality Date   CATARACT EXTRACTION     EYE SURGERY     IR ANGIOGRAM EXTREMITY RIGHT  12/24/2023   IR ANGIOGRAM PELVIS SELECTIVE OR SUPRASELECTIVE  12/24/2023   IR ILIAC ART STENT INC PTA MOD SED  12/24/2023   IR INTRAVASCULAR ULTRASOUND NON CORONARY  12/24/2023   IR RADIOLOGIST EVAL & MGMT  12/11/2023   IR US  GUIDE VASC ACCESS RIGHT  12/24/2023   ROTATOR CUFF REPAIR Bilateral     Allergies: Prednisone  Medications: Prior to Admission medications   Medication Sig Start Date End Date Taking? Authorizing Provider  Ascorbic Acid (VITAMIN C PO) Take 1 tablet by mouth every Monday, Wednesday, and Friday. Takes along with vit e    [provider]  aspirin  EC  81 MG tablet Take 1 tablet (81 mg total) by mouth daily. Swallow whole. 06/19/22   Ghimire, [name], MD  b complex vitamins capsule Take 1 capsule by mouth daily in the afternoon.    [provider]  Calcium  Carb-Cholecalciferol (CALCIUM  600 + D PO) Take 1  tablet by mouth 3 (three) times a week.    [provider]  clopidogrel  (PLAVIX ) 75 MG tablet Take 1 tablet (75 mg total) by mouth daily. 12/25/23   Covington, Jamie R, NP  Glucosamine HCl (GLUCOSAMINE PO) Take 1 tablet by mouth in the morning and at bedtime.    [provider]  lisinopril -hydrochlorothiazide  (ZESTORETIC ) 20-12.5 MG tablet Take 1 tablet by mouth daily. 06/19/22   Ghimire, [name], MD  lisinopril -hydrochlorothiazide  (ZESTORETIC ) 20-25 MG tablet Take 1 tablet by mouth daily.    [provider]  Multiple Vitamins-Minerals (PRESERVISION AREDS PO) Take 1 tablet by mouth daily in the afternoon.    [provider]  Omega-3 Fatty Acids (FISH OIL) 1000 MG CAPS Take 1,000 mg by mouth daily.    [provider]  Potassium (POTASSIMIN PO) Take 1 tablet by mouth in the morning and at bedtime.    [provider]  vitamin E 180 MG (400 UNITS) capsule Take 400 Units by mouth every Monday, Wednesday, and Friday.    [provider]     No family history on file.  Social History   Socioeconomic History   Marital status: Married    Spouse name: Not on file   Number of children: Not on file   Years of education: Not on file   Highest education level: Not on file  Occupational History   Not on file  Tobacco Use   Smoking status: Never   Smokeless tobacco: Not on file  Vaping Use   Vaping status: Unknown  Substance and Sexual Activity   Alcohol use: No   Drug use: No   Sexual activity: Not on file  Other Topics Concern   Not on file  Social History Narrative   Not on file   Social Drivers of Health   Financial Resource Strain: Not on file  Food Insecurity: Not on file  Transportation Needs: Not on file  Physical Activity: Not on file  Stress: Not on file  Social Connections: Not on file     Review of Systems: A 12 point ROS discussed and pertinent positives are indicated in the HPI above.  All other systems are  negative.  Review of Systems  Constitutional:  Negative for fever.  HENT:  Negative for congestion.   Respiratory:  Negative for cough and shortness of breath.   Cardiovascular:  Negative for chest pain.  Gastrointestinal:  Negative for abdominal pain.  Neurological:  Negative for headaches.  Psychiatric/Behavioral:  Negative for behavioral problems and confusion.     Vital Signs: BP (!) 156/98   Pulse 79   Temp 97.8 F (36.6 C) (Oral)   Resp 16   Ht 5' 1 (1.549 m)   Wt 123 lb (55.8 kg)   SpO2 96%   BMI 23.24 kg/m   Advance Care Plan: The advanced care plan/surrogate decision maker was discussed at the time of visit and the patient did not wish to discuss or was not able to name a surrogate decision maker or provide an advance care plan.    Physical Exam Vitals and nursing note reviewed.  Constitutional:      Appearance: He is well-developed.  HENT:  Head: Normocephalic.  Pulmonary:     Effort: Pulmonary effort is normal.  Musculoskeletal:        General: Normal range of motion.     Cervical back: Normal range of motion.  Skin:    General: Skin is dry.  Neurological:     Mental Status: He is alert and oriented to person, place, and time.     Imaging: IR Angiogram Extremity Right Result Date: 12/24/2023 INDICATION: 89 year old male with history of right lower extremity chronic limb threatening ischemia. EXAM: 1. Ultrasound-guided vascular access of the right common femoral artery. 2. Right lower extremity angiogram. 3. Intravascular ultrasound. 4. Right common iliac artery covered stent placement. MEDICATIONS: Ancef  2 gm IV. The antibiotic was administered within 1 hour of the procedure. 3000 units heparin , intravenous. ANESTHESIA/SEDATION: Moderate (conscious) sedation was employed during this procedure. A total of Versed  1.5 mg and Fentanyl  50 mcg was administered intravenously. Moderate Sedation Time: 54 minutes. The patient's level of consciousness and vital  signs were monitored continuously by radiology nursing throughout the procedure under my direct supervision. CONTRAST:  VISIPAQUE  IODIXANOL  320 MG/ML IV SOLN FLUOROSCOPY: Radiation Exposure Index (as provided by the fluoroscopic device): 90 mGy reference air Kerma COMPLICATIONS: None immediate. PROCEDURE: Informed consent was obtained from the patient following explanation of the procedure, risks, benefits and alternatives. The patient understands, agrees and consents for the procedure. All questions were addressed. A time out was performed prior to the initiation of the procedure. Maximal barrier sterile technique utilized including caps, mask, sterile gowns, sterile gloves, large sterile drape, hand hygiene, and Betadine prep. Preprocedure ultrasound evaluation demonstrated patency of the right common femoral artery. The procedure was planned. Subdermal Local anesthesia was administered 1% lidocaine . A small skin nick was made. Under direct ultrasound visualization, the right common femoral artery was accessed with a 21 gauge micropuncture needle. An ultrasound image was captured and stored in the permanent record. A micropuncture sheath was introduced through which a limited right lower extremity angiogram was performed demonstrating adequate puncture site for closure device use. A J wire was inserted and directed to the abdominal aorta over which the micropuncture sheath was exchanged for a 5 French vascular sheath. Over the wire, an Omni flush catheter was positioned near the aortic bifurcation. Right lower extremity angiogram with runoff was then performed and revealed the following. Inflow: The distal aorta is patent. There is a short segment, moderate (50%), focal stenosis about the common iliac artery which is otherwise patent. The internal and external iliac arteries are widely patent. The common femoral artery is widely patent. The profundal is patent. There are multifocal mild luminal  irregularities throughout the superficial femoral artery, none which appear flow limiting, which is otherwise patent. The popliteal artery is patent. There is an approximately 1 cm short segment occlusion about the tibioperoneal trunk which reconstitutes distally. The anterior tibial artery is patent proximally and tapers distally with multiple collaterals proximally. The posterior tibial artery is patent to the level of the plantar arch. There is a diminutive dorsalis pedis which appears to fill primarily from distal peroneal collaterals. The 5 French sheath was then exchanged for an 8 [place] sheath. The wire was reinserted over which an 0.035  intravascular ultrasound was advanced. Intravascular ultrasound confirmed a heterogeneous calcific and fibrofatty plaque about the proximal right common iliac artery resulting in approximately 50% stenosis. The IVUS was then exchanged for a covered, balloon expandable stent (VBX, 11 mm x 29 mm) which was deployed under fluoroscopic visualization about  the proximal right common iliac artery. Post deployment angiogram demonstrated complete wall apposition and patency with brisk antegrade flow. Repeat intravascular ultrasound confirmed these findings demonstrating patency of the stent with excellent wall apposition throughout. There was no post deployment balloon molding performed. The catheters and wire were removed. The 8 French sheath was exchanged for an 8 [place] Angio-Seal device was deployed successfully achieving immediate hemostasis. Upon completion of the procedure, the dorsalis pedis and posterior tibial arteries demonstrated biphasic signals. The patient tolerated the procedure well and was transferred to the recovery area in good condition. IMPRESSION: 1. Approximately 50% stenosis of the proximal right common iliac artery secondary to mixed fibrofatty and calcific atherosclerotic plaque (TASC 2 B). 2. 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 3. Technically successful placement of a covered, balloon expandable stent in the right common iliac artery. Ester Sides, MD [facility] Radiology Electronically Signed   By: Ester Sides M.D.   On: 12/24/2023 21:09   IR ILIAC ART STENT INC PTA MOD SED Result Date: 12/24/2023 INDICATION: 89 year old male with history of right lower extremity chronic limb threatening ischemia. EXAM: 1. Ultrasound-guided vascular access of the right common femoral artery. 2. Right lower extremity angiogram. 3. Intravascular ultrasound. 4. Right common iliac artery covered stent placement. MEDICATIONS: Ancef  2 gm IV. The antibiotic was administered within 1 hour of the procedure. 3000 units heparin , intravenous. ANESTHESIA/SEDATION: Moderate (conscious) sedation was employed during this procedure. A total of Versed  1.5 mg and Fentanyl  50 mcg was administered intravenously. Moderate Sedation Time: 54 minutes. The patient's level of consciousness and vital signs were monitored continuously by radiology nursing throughout the procedure under my direct supervision. CONTRAST:  VISIPAQUE  IODIXANOL  320 MG/ML IV SOLN FLUOROSCOPY: Radiation Exposure Index (as provided by the fluoroscopic device): 90 mGy reference air Kerma COMPLICATIONS: None immediate. PROCEDURE: Informed consent was obtained from the patient following explanation of the procedure, risks, benefits and alternatives. The patient understands, agrees and consents for the procedure. All questions were addressed. A time out was performed prior to the initiation of the procedure. Maximal barrier sterile technique utilized including caps, mask, sterile gowns, sterile gloves, large sterile drape, hand hygiene, and Betadine prep. Preprocedure ultrasound evaluation demonstrated patency of the right common femoral artery. The procedure was planned. Subdermal  Local anesthesia was administered 1% lidocaine . A small skin nick was made. Under direct ultrasound visualization, the right common femoral artery was accessed with a 21 gauge micropuncture needle. An ultrasound image was captured and stored in the permanent record. A micropuncture sheath was introduced through which a limited right lower extremity angiogram was performed demonstrating adequate puncture site for closure device use. A J wire was inserted and directed to the abdominal aorta over which the micropuncture sheath was exchanged for a 5 French vascular sheath. Over the wire, an Omni flush catheter was positioned near the aortic bifurcation. Right lower extremity angiogram with runoff was then performed and revealed the following. Inflow: The distal aorta is patent. There is a short segment, moderate (50%), focal stenosis about the common iliac artery which is otherwise patent. The internal and external iliac arteries are widely patent. The common femoral artery is widely patent. The profundal is patent. There are multifocal mild luminal irregularities throughout the superficial femoral artery, none which appear flow limiting, which is otherwise patent. The popliteal artery is patent. There is an approximately 1 cm short segment occlusion  about the tibioperoneal trunk which reconstitutes distally. The anterior tibial artery is patent proximally and tapers distally with multiple collaterals proximally. The posterior tibial artery is patent to the level of the plantar arch. There is a diminutive dorsalis pedis which appears to fill primarily from distal peroneal collaterals. The 5 French sheath was then exchanged for an 8 [place] sheath. The wire was reinserted over which an 0.035  intravascular ultrasound was advanced. Intravascular ultrasound confirmed a heterogeneous calcific and fibrofatty plaque about the proximal right common iliac artery resulting in approximately 50% stenosis. The IVUS was then  exchanged for a covered, balloon expandable stent (VBX, 11 mm x 29 mm) which was deployed under fluoroscopic visualization about the proximal right common iliac artery. Post deployment angiogram demonstrated complete wall apposition and patency with brisk antegrade flow. Repeat intravascular ultrasound confirmed these findings demonstrating patency of the stent with excellent wall apposition throughout. There was no post deployment balloon molding performed. The catheters and wire were removed. The 8 French sheath was exchanged for an 8 [place] Angio-Seal device was deployed successfully achieving immediate hemostasis. Upon completion of the procedure, the dorsalis pedis and posterior tibial arteries demonstrated biphasic signals. The patient tolerated the procedure well and was transferred to the recovery area in good condition. IMPRESSION: 1. Approximately 50% stenosis of the proximal right common iliac artery secondary to mixed fibrofatty and calcific atherosclerotic plaque (TASC 2 B). 2. 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 3. Technically successful placement of a covered, balloon expandable stent in the right common iliac artery. Ester Sides, MD [facility] Radiology Electronically Signed   By: Ester Sides M.D.   On: 12/24/2023 21:09   IR INTRAVASCULAR ULTRASOUND NON CORONARY Result Date: 12/24/2023 INDICATION: 89 year old male with history of right lower extremity chronic limb threatening ischemia. EXAM: 1. Ultrasound-guided vascular access of the right common femoral artery. 2. Right lower extremity angiogram. 3. Intravascular ultrasound. 4. Right common iliac artery covered stent placement. MEDICATIONS: Ancef  2 gm IV. The antibiotic was administered within 1 hour of the procedure. 3000 units heparin , intravenous. ANESTHESIA/SEDATION: Moderate (conscious) sedation  was employed during this procedure. A total of Versed  1.5 mg and Fentanyl  50 mcg was administered intravenously. Moderate Sedation Time: 54 minutes. The patient's level of consciousness and vital signs were monitored continuously by radiology nursing throughout the procedure under my direct supervision. CONTRAST:  VISIPAQUE  IODIXANOL  320 MG/ML IV SOLN FLUOROSCOPY: Radiation Exposure Index (as provided by the fluoroscopic device): 90 mGy reference air Kerma COMPLICATIONS: None immediate. PROCEDURE: Informed consent was obtained from the patient following explanation of the procedure, risks, benefits and alternatives. The patient understands, agrees and consents for the procedure. All questions were addressed. A time out was performed prior to the initiation of the procedure. Maximal barrier sterile technique utilized including caps, mask, sterile gowns, sterile gloves, large sterile drape, hand hygiene, and Betadine prep. Preprocedure ultrasound evaluation demonstrated patency of the right common femoral artery. The procedure was planned. Subdermal Local anesthesia was administered 1% lidocaine . A small skin nick was made. Under direct ultrasound visualization, the right common femoral artery was accessed with a 21 gauge micropuncture needle. An ultrasound image was captured and stored in the permanent record. A micropuncture sheath was introduced through which a limited right lower extremity angiogram was performed demonstrating adequate puncture site for closure device use. A J wire was inserted and directed to the abdominal aorta over  which the micropuncture sheath was exchanged for a 5 French vascular sheath. Over the wire, an Omni flush catheter was positioned near the aortic bifurcation. Right lower extremity angiogram with runoff was then performed and revealed the following. Inflow: The distal aorta is patent. There is a short segment, moderate (50%), focal stenosis about the common iliac artery which  is otherwise patent. The internal and external iliac arteries are widely patent. The common femoral artery is widely patent. The profundal is patent. There are multifocal mild luminal irregularities throughout the superficial femoral artery, none which appear flow limiting, which is otherwise patent. The popliteal artery is patent. There is an approximately 1 cm short segment occlusion about the tibioperoneal trunk which reconstitutes distally. The anterior tibial artery is patent proximally and tapers distally with multiple collaterals proximally. The posterior tibial artery is patent to the level of the plantar arch. There is a diminutive dorsalis pedis which appears to fill primarily from distal peroneal collaterals. The 5 French sheath was then exchanged for an 8 [place] sheath. The wire was reinserted over which an 0.035  intravascular ultrasound was advanced. Intravascular ultrasound confirmed a heterogeneous calcific and fibrofatty plaque about the proximal right common iliac artery resulting in approximately 50% stenosis. The IVUS was then exchanged for a covered, balloon expandable stent (VBX, 11 mm x 29 mm) which was deployed under fluoroscopic visualization about the proximal right common iliac artery. Post deployment angiogram demonstrated complete wall apposition and patency with brisk antegrade flow. Repeat intravascular ultrasound confirmed these findings demonstrating patency of the stent with excellent wall apposition throughout. There was no post deployment balloon molding performed. The catheters and wire were removed. The 8 French sheath was exchanged for an 8 [place] Angio-Seal device was deployed successfully achieving immediate hemostasis. Upon completion of the procedure, the dorsalis pedis and posterior tibial arteries demonstrated biphasic signals. The patient tolerated the procedure well and was transferred to the recovery area in good condition. IMPRESSION: 1. Approximately 50% stenosis of  the proximal right common iliac artery secondary to mixed fibrofatty and calcific atherosclerotic plaque (TASC 2 B). 2. 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 3. Technically successful placement of a covered, balloon expandable stent in the right common iliac artery. Ester Sides, MD [facility] Radiology Electronically Signed   By: Ester Sides M.D.   On: 12/24/2023 21:09   IR US  Guide Vasc Access Right Result Date: 12/24/2023 INDICATION: 89 year old male with history of right lower extremity chronic limb threatening ischemia. EXAM: 1. Ultrasound-guided vascular access of the right common femoral artery. 2. Right lower extremity angiogram. 3. Intravascular ultrasound. 4. Right common iliac artery covered stent placement. MEDICATIONS: Ancef  2 gm IV. The antibiotic was administered within 1 hour of the procedure. 3000 units heparin , intravenous. ANESTHESIA/SEDATION: Moderate (conscious) sedation was employed during this procedure. A total of Versed  1.5 mg and Fentanyl  50 mcg was administered intravenously. Moderate Sedation Time: 54 minutes. The patient's level of consciousness and vital signs were monitored continuously by radiology nursing throughout the procedure under my direct supervision. CONTRAST:  VISIPAQUE  IODIXANOL  320 MG/ML IV SOLN FLUOROSCOPY: Radiation Exposure Index (as provided by the fluoroscopic device): 90 mGy reference air Kerma COMPLICATIONS: None immediate. PROCEDURE: Informed consent was obtained from the patient following explanation of the procedure, risks, benefits and alternatives. The patient understands, agrees and consents for the procedure. All questions were addressed. A time out was performed prior  to the initiation of the procedure. Maximal barrier sterile technique utilized including caps, mask, sterile gowns, sterile gloves, large  sterile drape, hand hygiene, and Betadine prep. Preprocedure ultrasound evaluation demonstrated patency of the right common femoral artery. The procedure was planned. Subdermal Local anesthesia was administered 1% lidocaine . A small skin nick was made. Under direct ultrasound visualization, the right common femoral artery was accessed with a 21 gauge micropuncture needle. An ultrasound image was captured and stored in the permanent record. A micropuncture sheath was introduced through which a limited right lower extremity angiogram was performed demonstrating adequate puncture site for closure device use. A J wire was inserted and directed to the abdominal aorta over which the micropuncture sheath was exchanged for a 5 French vascular sheath. Over the wire, an Omni flush catheter was positioned near the aortic bifurcation. Right lower extremity angiogram with runoff was then performed and revealed the following. Inflow: The distal aorta is patent. There is a short segment, moderate (50%), focal stenosis about the common iliac artery which is otherwise patent. The internal and external iliac arteries are widely patent. The common femoral artery is widely patent. The profundal is patent. There are multifocal mild luminal irregularities throughout the superficial femoral artery, none which appear flow limiting, which is otherwise patent. The popliteal artery is patent. There is an approximately 1 cm short segment occlusion about the tibioperoneal trunk which reconstitutes distally. The anterior tibial artery is patent proximally and tapers distally with multiple collaterals proximally. The posterior tibial artery is patent to the level of the plantar arch. There is a diminutive dorsalis pedis which appears to fill primarily from distal peroneal collaterals. The 5 French sheath was then exchanged for an 8 [place] sheath. The wire was reinserted over which an 0.035  intravascular ultrasound was advanced. Intravascular  ultrasound confirmed a heterogeneous calcific and fibrofatty plaque about the proximal right common iliac artery resulting in approximately 50% stenosis. The IVUS was then exchanged for a covered, balloon expandable stent (VBX, 11 mm x 29 mm) which was deployed under fluoroscopic visualization about the proximal right common iliac artery. Post deployment angiogram demonstrated complete wall apposition and patency with brisk antegrade flow. Repeat intravascular ultrasound confirmed these findings demonstrating patency of the stent with excellent wall apposition throughout. There was no post deployment balloon molding performed. The catheters and wire were removed. The 8 French sheath was exchanged for an 8 [place] Angio-Seal device was deployed successfully achieving immediate hemostasis. Upon completion of the procedure, the dorsalis pedis and posterior tibial arteries demonstrated biphasic signals. The patient tolerated the procedure well and was transferred to the recovery area in good condition. IMPRESSION: 1. Approximately 50% stenosis of the proximal right common iliac artery secondary to mixed fibrofatty and calcific atherosclerotic plaque (TASC 2 B). 2. 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 3. Technically successful placement of a covered, balloon expandable stent in the right common iliac artery. Ester Sides, MD [facility] Radiology Electronically Signed   By: Ester Sides M.D.   On: 12/24/2023 21:09   IR Angiogram Pelvis Selective Or Supraselective Result Date: 12/24/2023 INDICATION: 89 year old male with history of right lower extremity chronic limb threatening ischemia. EXAM: 1. Ultrasound-guided vascular access of the right common femoral artery. 2. Right lower extremity angiogram. 3. Intravascular ultrasound. 4. Right common iliac artery covered stent  placement. MEDICATIONS: Ancef  2 gm IV. The antibiotic was administered within 1 hour  of the procedure. 3000 units heparin , intravenous. ANESTHESIA/SEDATION: Moderate (conscious) sedation was employed during this procedure. A total of Versed  1.5 mg and Fentanyl  50 mcg was administered intravenously. Moderate Sedation Time: 54 minutes. The patient's level of consciousness and vital signs were monitored continuously by radiology nursing throughout the procedure under my direct supervision. CONTRAST:  VISIPAQUE  IODIXANOL  320 MG/ML IV SOLN FLUOROSCOPY: Radiation Exposure Index (as provided by the fluoroscopic device): 90 mGy reference air Kerma COMPLICATIONS: None immediate. PROCEDURE: Informed consent was obtained from the patient following explanation of the procedure, risks, benefits and alternatives. The patient understands, agrees and consents for the procedure. All questions were addressed. A time out was performed prior to the initiation of the procedure. Maximal barrier sterile technique utilized including caps, mask, sterile gowns, sterile gloves, large sterile drape, hand hygiene, and Betadine prep. Preprocedure ultrasound evaluation demonstrated patency of the right common femoral artery. The procedure was planned. Subdermal Local anesthesia was administered 1% lidocaine . A small skin nick was made. Under direct ultrasound visualization, the right common femoral artery was accessed with a 21 gauge micropuncture needle. An ultrasound image was captured and stored in the permanent record. A micropuncture sheath was introduced through which a limited right lower extremity angiogram was performed demonstrating adequate puncture site for closure device use. A J wire was inserted and directed to the abdominal aorta over which the micropuncture sheath was exchanged for a 5 French vascular sheath. Over the wire, an Omni flush catheter was positioned near the aortic bifurcation. Right lower extremity angiogram  with runoff was then performed and revealed the following. Inflow: The distal aorta is patent. There is a short segment, moderate (50%), focal stenosis about the common iliac artery which is otherwise patent. The internal and external iliac arteries are widely patent. The common femoral artery is widely patent. The profundal is patent. There are multifocal mild luminal irregularities throughout the superficial femoral artery, none which appear flow limiting, which is otherwise patent. The popliteal artery is patent. There is an approximately 1 cm short segment occlusion about the tibioperoneal trunk which reconstitutes distally. The anterior tibial artery is patent proximally and tapers distally with multiple collaterals proximally. The posterior tibial artery is patent to the level of the plantar arch. There is a diminutive dorsalis pedis which appears to fill primarily from distal peroneal collaterals. The 5 French sheath was then exchanged for an 8 [place] sheath. The wire was reinserted over which an 0.035  intravascular ultrasound was advanced. Intravascular ultrasound confirmed a heterogeneous calcific and fibrofatty plaque about the proximal right common iliac artery resulting in approximately 50% stenosis. The IVUS was then exchanged for a covered, balloon expandable stent (VBX, 11 mm x 29 mm) which was deployed under fluoroscopic visualization about the proximal right common iliac artery. Post deployment angiogram demonstrated complete wall apposition and patency with brisk antegrade flow. Repeat intravascular ultrasound confirmed these findings demonstrating patency of the stent with excellent wall apposition throughout. There was no post deployment balloon molding performed. The catheters and wire were removed. The 8 French sheath was exchanged for an 8 [place] Angio-Seal device was deployed successfully achieving immediate hemostasis. Upon completion of the procedure, the dorsalis pedis and posterior  tibial arteries demonstrated biphasic signals. The patient tolerated the procedure well and was transferred to the recovery area in good condition. IMPRESSION: 1. Approximately 50% stenosis of the proximal right common iliac artery secondary to mixed fibrofatty and calcific atherosclerotic plaque (TASC 2 B). 2. 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 3. Technically successful placement of a covered, balloon expandable stent in the right common iliac artery. Ester Sides, MD [facility] Radiology Electronically Signed   By: Ester Sides M.D.   On: 12/24/2023 21:09   CT ANGIO AO+BIFEM W & OR WO CONTRAST Result Date: 12/12/2023 CLINICAL DATA:  89 year old male with history of nonhealing right lower extremity wound. EXAM: CT ANGIOGRAPHY OF ABDOMINAL AORTA WITH ILIOFEMORAL RUNOFF TECHNIQUE: Multidetector CT imaging of the abdomen, pelvis and lower extremities was performed using the standard protocol during bolus administration of intravenous contrast. Multiplanar CT image reconstructions and MIPs were obtained to evaluate the vascular anatomy. RADIATION DOSE REDUCTION: This exam was performed according to the departmental dose-optimization program which includes automated exposure control, adjustment of the mA and/or kV according to patient size and/or use of iterative reconstruction technique. CONTRAST:  100mL ISOVUE -370 IOPAMIDOL  (ISOVUE -370) INJECTION 76% COMPARISON:  None Available. FINDINGS: VASCULAR Aorta: Normal caliber aorta without aneurysm, dissection, vasculitis or significant stenosis. Scattered atherosclerotic calcifications. Celiac: Patent without evidence of aneurysm, dissection, vasculitis or significant stenosis. SMA: Patent without evidence of aneurysm, dissection, vasculitis or significant stenosis. Renals: Dual bilateral renal arteries are patent without  evidence of aneurysm, dissection, vasculitis, fibromuscular dysplasia or significant stenosis. IMA: Patent without evidence of aneurysm, dissection, vasculitis or significant stenosis. RIGHT Lower Extremity Inflow: Focal mixed calcific and fibrofatty atherosclerotic plaque about the medial aspect of the proximal common iliac artery resulting in approximately 50% stenosis. The remaining common, internal common external iliac arteries are normal in caliber and patent. Outflow: The common femoral artery is patent. The profundus femoral artery is patent. The superficial femoral artery is patent with scattered atherosclerotic calcifications distal ting and multifocal mild stenoses. The popliteal artery is patent. Runoff: The anterior tibial artery is patent proximally with multifocal atherosclerotic calcifications throughout its midportion with a possible thread of patency and distal reconstitution about the level of the ankle. There is an approximately 1.2 cm short segment occlusion about the tibioperoneal trunk which reconstitutes distally. The peroneal and posterior tibial arteries are patent. LEFT Lower Extremity Inflow: Common, internal and external iliac arteries are patent without evidence of aneurysm, dissection, vasculitis or significant stenosis. Outflow: Mild multifocal stenoses throughout the superficial femoral artery, most prominent at Hunter's canal secondary to atherosclerotic plaques, likely not flow limiting. The common, profunda, and popliteal arteries are patent. Runoff: The anterior tibial artery is patent about the ostium with long segment occlusion distally. Patent peroneal and posterior tibial arteries. Veins: No obvious venous abnormality within the limitations of this arterial phase study. Review of the MIP images confirms the above findings. NON-VASCULAR Lower chest: Scattered subpleural reticular opacifications and scattered lower lobe bronchiectasis. The visualized heart is within normal  limits of size without evidence of pericardial effusion. Hepatobiliary: No focal liver abnormality is seen. No gallstones, gallbladder wall thickening, or biliary dilatation. Pancreas: Unremarkable. No pancreatic ductal dilatation or surrounding inflammatory changes. Spleen: Normal in size without focal abnormality. Adrenals/Urinary Tract: Adrenal glands are unremarkable. There are a few scattered simple appearing renal cysts, for example in the right lower pole measuring up to 1.8 cm. None of the visualized cysts require additional follow-up. Kidneys are otherwise normal, without renal calculi, focal lesion, or hydronephrosis. Multiple bladder calculi are present largest measuring up to 1.4 cm. No bladder wall thickening. Stomach/Bowel: Stomach is within normal limits. Appendix is not definitively identified. No evidence of bowel wall thickening, distention, or  inflammatory changes. Lymphatic: No abdominopelvic lymphadenopathy. Reproductive: The prostate gland measures up to 6.8 cm in maximum axial dimension. There are few internal dystrophic calcifications. Other: No abdominal wall hernia or abnormality. No abdominopelvic ascites. Musculoskeletal: No acute osseous abnormality. Mild multilevel degenerative changes of visualized thoracolumbar spine. IMPRESSION: VASCULAR 1. Right lower extremity: Approximately 50% focal stenosis about the right common iliac artery secondary to atherosclerotic plaque. Short segment occlusion of the tibioperoneal trunk with distal reconstitution. Likely occluded anterior tibial artery, limited evaluation due to extensive atherosclerotic calcifications however the dorsalis pedis appears patent at the level of the ankle. 2. Left lower extremity: Patent inflow, outflow, and 2 vessel runoff. 3.  Aortic Atherosclerosis (ICD10-I70.0). NON-VASCULAR 1. Reticular opacities and bronchiectasis visualized in the lung bases bilaterally as could be seen with interstitial lung disease. Consider  dedicated ILD protocol CT chest for further characterization and Pulmonology consultation. 2. Multifocal bladder calculi are present. 3. Prostatomegaly. Ester Sides, MD [facility] Radiology Electronically Signed   By: Ester Sides M.D.   On: 12/12/2023 07:11   IR Radiologist Eval & Mgmt Result Date: 12/11/2023 EXAM: NEW PATIENT OFFICE VISIT CHIEF COMPLAINT: See Epic note. HISTORY OF PRESENT ILLNESS: See Epic note. REVIEW OF SYSTEMS: See Epic note. PHYSICAL EXAMINATION: See Epic note. ASSESSMENT AND PLAN: See Epic note. Ester Sides, MD [facility] Radiology Electronically Signed   By: Ester Sides M.D.   On: 12/11/2023 16:46    Labs:  CBC: Recent Labs    12/24/23 0847  WBC 8.7  HGB 12.5*  HCT 37.7*  PLT 306    COAGS: Recent Labs    12/24/23 0847  INR 1.0    BMP: Recent Labs    12/24/23 0847  NA 141  K 4.1  CL 106  CO2 30  GLUCOSE 98  BUN 28*  CALCIUM  8.9  CREATININE 1.02  GFRNONAA >60    LIVER FUNCTION TESTS: No results for input(s): BILITOT, AST, ALT, ALKPHOS, PROT, ALBUMIN in the last 8760 hours.  TUMOR MARKERS: No results for input(s): AFPTM, CEA, CA199, CHROMGRNA in the last 8760 hours.  Assessment and Plan:  history of HTN, CVA and  macular degeneration. Found to have a non healing right posterior ankle wound on 202 and a enlarging right lateral ankle wound from a biopsy performed at the dermatologists office in May 2025.  Since that time Mr. Preziosi has been receiving wound care initially at the wound care clinic and now provided by his wife. He describes that pain as excruciating and states that it has affected his mobility and his ability to sleep.  Pain is made slightly better with Lyrica. Antibiotics have no affect. An ABI performed on 7.17.25 showed moderate right lower extremity arterial disease, however monophasic waveforms bilaterally and  calcifications making the ABI measurements erroneous.  CTA of abdominal aorta with iliofemoral runoff from 8.1.25 reads  Approximately 50% focal stenosis about the right common iliac artery secondary to atherosclerotic plaque. Short segment occlusion of the tibioperoneal trunk with distal reconstitution. Likely occluded anterior tibial artery, limited evaluation due to extensive atherosclerotic calcifications however the dorsalis pedis appears patent at the level of the ankle.   The patient was seen for consultation in the Interventional Radiology Clinic  on 7.28.25 with IR Attending Dr. Ester Sides . At that time a detailed discussion regarding the Patient's medical condition including but not limited to possible treatment options took place. Following that discussion the Patient and his wife elected to  proceed with staged approach to recanalization.  [name] was also started on dual antiplatelet therapy at that time. On 8.14.25 step 1 occurred. A right lower angiogram was performed and a right common iliac covered balloon expandable stent (VBX, 11 mm x 29 mm)was placed.  The Patient presents today for stage 2. A repeat angiogram with possible tibial recanalization.  PLAN: IR Image Guided Arteriogram with Possible Right Tibial Recanalization  The Risks and benefits of embolization were discussed with the patient including, but not limited to bleeding, infection, vascular injury, post operative pain, or contrast induced renal failure.  This procedure involves the use of X-rays and because of the nature of the planned procedure, it is possible that we will have prolonged use of X-ray fluoroscopy.  Potential radiation risks to you include (but are not limited to) the following: - A slightly elevated risk for cancer several years later in life. This risk is typically less than 0.5% percent. This risk is low in comparison to the normal incidence of human cancer, which is 33% for women and 50% for men  according to the [employer]. - Radiation induced injury can include skin redness, resembling a rash, tissue breakdown / ulcers and hair loss (which can be temporary or permanent).   The likelihood of either of these occurring depends on the difficulty of the procedure and whether you are sensitive to radiation due to previous procedures, disease, or genetic conditions.   IF your procedure requires a prolonged use of radiation, you will be notified and given written instructions for further action.  It is your responsibility to monitor the irradiated area for the 2 weeks following the procedure and to notify your physician if you are concerned that you have suffered a radiation induced injury.    All of the patient's questions were answered, patient is agreeable to proceed. Consent signed and in chart.   Thank you for this interesting consult.  I greatly enjoyed meeting [name] and look forward to participating in their care.  A copy of this report was sent to the requesting provider on this date.  Electronically Signed: Delon JAYSON Beagle, NP 01/06/2024, 9:13 AM   I spent a total of    15 Minutes in face to face in clinical consultation, greater than 50% of which was counseling/coordinating care for arteriogram with possible right tibial recanalization

## 2024-01-06 ENCOUNTER — Other Ambulatory Visit: Payer: Self-pay

## 2024-01-06 ENCOUNTER — Other Ambulatory Visit (HOSPITAL_COMMUNITY): Payer: Self-pay | Admitting: Interventional Radiology

## 2024-01-06 ENCOUNTER — Ambulatory Visit (HOSPITAL_COMMUNITY)
Admission: RE | Admit: 2024-01-06 | Discharge: 2024-01-06 | Disposition: A | Discharge status: Home / Self Care | Source: Ambulatory Visit | Attending: Interventional Radiology | Admitting: Interventional Radiology

## 2024-01-06 DIAGNOSIS — I708 Atherosclerosis of other arteries: Secondary | ICD-10-CM | POA: Diagnosis not present

## 2024-01-06 DIAGNOSIS — Z95820 Peripheral vascular angioplasty status with implants and grafts: Secondary | ICD-10-CM | POA: Insufficient documentation

## 2024-01-06 DIAGNOSIS — I70211 Atherosclerosis of native arteries of extremities with intermittent claudication, right leg: Secondary | ICD-10-CM | POA: Insufficient documentation

## 2024-01-06 DIAGNOSIS — I739 Peripheral vascular disease, unspecified: Secondary | ICD-10-CM

## 2024-01-06 DIAGNOSIS — I10 Essential (primary) hypertension: Secondary | ICD-10-CM | POA: Insufficient documentation

## 2024-01-06 DIAGNOSIS — Z7902 Long term (current) use of antithrombotics/antiplatelets: Secondary | ICD-10-CM | POA: Insufficient documentation

## 2024-01-06 DIAGNOSIS — Z7982 Long term (current) use of aspirin: Secondary | ICD-10-CM | POA: Diagnosis not present

## 2024-01-06 DIAGNOSIS — Z8673 Personal history of transient ischemic attack (TIA), and cerebral infarction without residual deficits: Secondary | ICD-10-CM | POA: Diagnosis not present

## 2024-01-06 DIAGNOSIS — I998 Other disorder of circulatory system: Secondary | ICD-10-CM

## 2024-01-06 DIAGNOSIS — I70201 Unspecified atherosclerosis of native arteries of extremities, right leg: Secondary | ICD-10-CM | POA: Diagnosis not present

## 2024-01-06 HISTORY — PX: IR TIB-PERO ART ATHEREC INC PTA MOD SED: IMG2314

## 2024-01-06 HISTORY — PX: IR TIB-PERO ART ATHER INC PTA EA ADD VESSEL MOD SED: IMG2318

## 2024-01-06 HISTORY — PX: IR ANGIOGRAM EXTREMITY RIGHT: IMG652

## 2024-01-06 HISTORY — PX: IR FEM POP ART ATHERECT INC PTA MOD SED: IMG2310

## 2024-01-06 HISTORY — PX: IR US GUIDE VASC ACCESS RIGHT: IMG2390

## 2024-01-06 LAB — CBC WITH DIFFERENTIAL/PLATELET
Abs Immature Granulocytes: 0.04 K/uL (ref 0.00–0.07)
Basophils Absolute: 0.1 K/uL (ref 0.0–0.1)
Basophils Relative: 1 %
Eosinophils Absolute: 0.4 K/uL (ref 0.0–0.5)
Eosinophils Relative: 4 %
HCT: 37.4 % — ABNORMAL LOW (ref 39.0–52.0)
Hemoglobin: 12.3 g/dL — ABNORMAL LOW (ref 13.0–17.0)
Immature Granulocytes: 0 %
Lymphocytes Relative: 9 %
Lymphs Abs: 0.9 K/uL (ref 0.7–4.0)
MCH: 30.8 pg (ref 26.0–34.0)
MCHC: 32.9 g/dL (ref 30.0–36.0)
MCV: 93.5 fL (ref 80.0–100.0)
Monocytes Absolute: 0.7 K/uL (ref 0.1–1.0)
Monocytes Relative: 7 %
Neutro Abs: 7.8 K/uL — ABNORMAL HIGH (ref 1.7–7.7)
Neutrophils Relative %: 79 %
Platelets: 302 K/uL (ref 150–400)
RBC: 4 MIL/uL — ABNORMAL LOW (ref 4.22–5.81)
RDW: 14.7 % (ref 11.5–15.5)
WBC: 9.9 K/uL (ref 4.0–10.5)
nRBC: 0 % (ref 0.0–0.2)

## 2024-01-06 LAB — PROTIME-INR
INR: 1.1 (ref 0.8–1.2)
Prothrombin Time: 14.4 s (ref 11.4–15.2)

## 2024-01-06 MED ORDER — HEPARIN SODIUM (PORCINE) 1000 UNIT/ML IJ SOLN
INTRAMUSCULAR | Status: AC
  Filled 2024-01-06: qty 10

## 2024-01-06 MED ORDER — LIDOCAINE-EPINEPHRINE 1 %-1:100000 IJ SOLN
INTRAMUSCULAR | Status: AC
  Filled 2024-01-06: qty 1

## 2024-01-06 MED ORDER — MIDAZOLAM HCL 2 MG/2ML IJ SOLN
INTRAMUSCULAR | Status: AC
Start: 2024-01-06 — End: 2024-01-06
  Filled 2024-01-06: qty 2

## 2024-01-06 MED ORDER — LIDOCAINE-EPINEPHRINE 1 %-1:100000 IJ SOLN
20.0000 mL | Freq: Once | INTRAMUSCULAR | Status: AC
  Administered 2024-01-06: 10 mL via INTRADERMAL

## 2024-01-06 MED ORDER — FENTANYL CITRATE (PF) 100 MCG/2ML IJ SOLN
INTRAMUSCULAR | Status: AC | PRN
  Administered 2024-01-06 (×4): 25 ug via INTRAVENOUS

## 2024-01-06 MED ORDER — HEPARIN SODIUM (PORCINE) 1000 UNIT/ML IJ SOLN
INTRAMUSCULAR | Status: AC | PRN
  Administered 2024-01-06: 3000 [IU] via INTRAVENOUS

## 2024-01-06 MED ORDER — CEFAZOLIN SODIUM-DEXTROSE 2-4 GM/100ML-% IV SOLN: 2.0000 g | INTRAVENOUS | Status: DC

## 2024-01-06 MED ORDER — MIDAZOLAM HCL 2 MG/2ML IJ SOLN
INTRAMUSCULAR | Status: AC | PRN
  Administered 2024-01-06: 1 mg via INTRAVENOUS
  Administered 2024-01-06 (×3): .5 mg via INTRAVENOUS

## 2024-01-06 MED ORDER — IODIXANOL 320 MG/ML IV SOLN
200.0000 mL | Freq: Once | INTRAVENOUS | Status: AC | PRN
Start: 2024-01-06 — End: 2024-01-06
  Administered 2024-01-06: 120 mL via INTRAVENOUS

## 2024-01-06 MED ORDER — DIPHENHYDRAMINE HCL 50 MG/ML IJ SOLN
INTRAMUSCULAR | Status: AC | PRN
  Administered 2024-01-06: 25 mg via INTRAVENOUS

## 2024-01-06 MED ORDER — MIDAZOLAM HCL 2 MG/2ML IJ SOLN
INTRAMUSCULAR | Status: AC
  Filled 2024-01-06: qty 2

## 2024-01-06 MED ORDER — DIPHENHYDRAMINE HCL 50 MG/ML IJ SOLN
INTRAMUSCULAR | Status: AC
  Filled 2024-01-06: qty 1

## 2024-01-06 MED ORDER — CEFAZOLIN SODIUM-DEXTROSE 2-4 GM/100ML-% IV SOLN
INTRAVENOUS | Status: AC
  Filled 2024-01-06: qty 100

## 2024-01-06 MED ORDER — FENTANYL CITRATE (PF) 100 MCG/2ML IJ SOLN
INTRAMUSCULAR | Status: AC
  Filled 2024-01-06: qty 2

## 2024-01-06 NOTE — Progress Notes (Signed)
 Up and walked and tolerated well; right groin stable, no bleeding or hematoma

## 2024-01-06 NOTE — Sedation Documentation (Signed)
ACT 147

## 2024-01-06 NOTE — Procedures (Signed)
 Interventional Radiology Procedure Note  Procedure:  1) Right lower extremity angiogram 2) Laser atherectomy of distal popliteal artery and tibioperoneal trunk 3) Plain balloon angioplasty of distal popliteal, tibioperoneal trunk, proximal peroneal, and proximal posterior tibial arteries 4) Drug coated balloon angioplasty of the distal popliteal and tibioperoneal trunk  Findings: Please refer to procedural dictation for full description. Right CFA 5 Fr access, 5 Fr Mynx closure  Complications: None immediate  Estimated Blood Loss: < 5 ml  Recommendations: 2 hours bedrest - 1 hour flat, 1 hour head of bed up to 30 degrees. Continue aspirin  and plavix . IR will arrange 1 month outpatient follow up with ABIs.   Ester Sides, MD

## 2024-01-06 NOTE — Sedation Documentation (Signed)
 Patient transported to recovery area via stretcher. Bedside report given to RN. Femoral site assessed - Level 0, no hematoma, dressing is clean, dry, and intact. Pulses also assessed bilaterally.

## 2024-01-06 NOTE — Discharge Instructions (Signed)
 Femoral Site Care The following information offers guidance on how to care for yourself after your procedure. Your health care provider may also give you more specific instructions. If you have problems or questions, contact your health care provider. What can I expect after the procedure? After the procedure, it is common to have bruising and tenderness at the incision site. This usually fades within 1-2 weeks. Follow these instructions at home: Incision site care  Follow instructions from your health care provider about how to take care of your incision site. Make sure you: Wash your hands with soap and water for at least 20 seconds before and after you change your bandage (dressing). If soap and water are not available, use hand sanitizer. Remove your dressing in 24 hours. Leave stitches (sutures), skin glue, or adhesive strips in place. These skin closures may need to stay in place for 2 weeks or longer. If adhesive strip edges start to loosen and curl up, you may trim the loose edges. Do not remove adhesive strips completely unless your health care provider tells you to do that. Do not take baths, swim, or use a hot tub for at least 1 week. You may shower 24 hours after the procedure or as told by your health care provider. Gently wash the incision site with plain soap and water. Pat the area dry with a clean towel. Do not rub the site. This may cause bleeding. Do not apply powder or lotion to the site. Keep the site clean and dry. Check your femoral site every day for signs of infection. Check for: Redness, swelling, or pain. Fluid or blood. Warmth. Pus or a bad smell. Activity If you were given a sedative during the procedure, it can affect you for several hours. Do not drive or operate machinery until your health care provider says that it is safe. Rest as told by your health care provider. Avoid sitting for a long time without moving. Get up to take short walks every 1-2 hours. This  is important to improve blood flow and breathing. Ask for help if you feel weak or unsteady. Return to your normal activities as told by your health care provider. Ask your health care provider what activities are safe for you and when you can return to work. Avoid activities that take a lot of effort for the first 2-3 days after your procedure, or as long as directed. Do not lift anything that is heavier than 10 lb (4.5 kg), or the limit that you are told, until your health care provider says that it is safe. General instructions Take over-the-counter and prescription medicines only as told by your health care provider. If you will be going home right after the procedure, plan to have a responsible adult care for you for the time you are told. This is important. Keep all follow-up visits. This is important. Contact a health care provider if: You have a fever or chills. You have any of these signs of infection at your incision site: Redness, swelling, or pain. Fluid or blood. Warmth. Pus or a bad smell. Get help right away if: The incision area swells very fast. The incision area is bleeding, and the bleeding does not stop when you hold steady pressure on the area. Your leg or foot becomes pale, cool, tingly, or numb. These symptoms may represent a serious problem that is an emergency. Do not wait to see if the symptoms will go away. Get medical help right away. Call your local emergency  services (911 in the [place] Do not drive yourself to the hospital. Summary After the procedure, it is common to have bruising and tenderness that fade within 1-2 weeks. Check your femoral site every day for signs of infection. Do not lift anything that is heavier than 10 lb (4.5 kg), or the limit that you are told, until your health care provider says that it is safe. Get help right away if the incision area swells very fast, you have bleeding at the incision area that does not stop, or your leg or foot  becomes pale, cool, or numb. This information is not intended to replace advice given to you by your health care provider. Make sure you discuss any questions you have with your health care provider. Document Revised: 01/16/2021 Document Reviewed: 06/17/2020 Elsevier Patient Education  2024 [employer].

## 2024-01-07 LAB — POCT ACTIVATED CLOTTING TIME: Activated Clotting Time: 147 s

## 2024-01-07 NOTE — Progress Notes (Signed)
 [facility] - Clinic Note  01/12/2024     CHIEF COMPLAINT Patient presents for Retina Follow Up   HISTORY OF PRESENT ILLNESS: [name] is a 89 y.o. male who presents to the clinic today for:   HPI     Retina Follow Up   Patient presents with  Wet AMD.  In left eye.  This started 6 weeks ago.  Duration of 6 weeks.  Since onset it is stable.  I, the attending physician,  performed the HPI with the patient and updated documentation appropriately.        Comments   6 week retina follow up AMD OS and IVA OS pt is reporting no vision changes noticed he denies any flashes or floaters pt has had 2 stent within the last 2 weeks the last one less than a week ago       Last edited by Valdemar Rogue, MD on 01/12/2024  4:42 PM.    Patient states he had two stents placed for his leg to help improve blood flow, one in the abdomen and one below the knee. He does feel better, less painful but is on pain meds.    Referring physician: Cleatus Collar, MD [address] [place],  [place] [address]  HISTORICAL INFORMATION:   Selected notes from the MEDICAL RECORD NUMBER Referred by Dr. Cleatus for concern of exu ARMD  LEE:  Ocular Hx- PMH-    CURRENT MEDICATIONS: No current outpatient medications on file. (Ophthalmic Drugs)   No current facility-administered medications for this visit. (Ophthalmic Drugs)   Current Outpatient Medications (Other)  Medication Sig   Ascorbic Acid (VITAMIN C PO) Take 1 tablet by mouth every Monday, Wednesday, and Friday. Takes along with vit e   aspirin  EC 81 MG tablet Take 1 tablet (81 mg total) by mouth daily. Swallow whole.   b complex vitamins capsule Take 1 capsule by mouth daily in the afternoon.   Calcium  Carb-Cholecalciferol (CALCIUM  600 + D PO) Take 1 tablet by mouth 3 (three) times a week.   clopidogrel  (PLAVIX ) 75 MG tablet Take 1 tablet (75 mg total) by mouth daily.   Glucosamine HCl (GLUCOSAMINE PO) Take 1 tablet by  mouth in the morning and at bedtime.   lisinopril -hydrochlorothiazide  (ZESTORETIC ) 20-12.5 MG tablet Take 1 tablet by mouth daily.   lisinopril -hydrochlorothiazide  (ZESTORETIC ) 20-25 MG tablet Take 1 tablet by mouth daily.   Multiple Vitamins-Minerals (PRESERVISION AREDS PO) Take 1 tablet by mouth daily in the afternoon.   Omega-3 Fatty Acids (FISH OIL) 1000 MG CAPS Take 1,000 mg by mouth daily.   Potassium (POTASSIMIN PO) Take 1 tablet by mouth in the morning and at bedtime.   vitamin E 180 MG (400 UNITS) capsule Take 400 Units by mouth every Monday, Wednesday, and Friday.   No current facility-administered medications for this visit. (Other)   REVIEW OF SYSTEMS: ROS   Positive for: Eyes Negative for: Constitutional, Gastrointestinal, Neurological, Skin, Genitourinary, Musculoskeletal, HENT, Endocrine, Cardiovascular, Respiratory, Psychiatric, Allergic/Imm, Heme/Lymph Last edited by Resa Delon ORN, COT on 01/12/2024  1:45 PM.        ALLERGIES Allergies  Allergen Reactions   Prednisone Other (See Comments)    Unknown    PAST MEDICAL HISTORY Past Medical History:  Diagnosis Date   Hypertension    Macular degeneration    Past Surgical History:  Procedure Laterality Date   CATARACT EXTRACTION     EYE SURGERY     IR ANGIOGRAM EXTREMITY RIGHT  12/24/2023   IR ANGIOGRAM EXTREMITY RIGHT  01/06/2024   IR ANGIOGRAM PELVIS SELECTIVE OR SUPRASELECTIVE  12/24/2023   IR FEM POP ART ATHERECT INC PTA MOD SED  01/06/2024   IR ILIAC ART STENT INC PTA MOD SED  12/24/2023   IR INTRAVASCULAR ULTRASOUND NON CORONARY  12/24/2023   IR RADIOLOGIST EVAL & MGMT  12/11/2023   IR TIB-PERO ART ATHER INC PTA EA ADD VESSEL MOD SED  01/06/2024   IR TIB-PERO ART ATHEREC INC PTA MOD SED  01/06/2024   IR US  GUIDE VASC ACCESS RIGHT  12/24/2023   IR US  GUIDE VASC ACCESS RIGHT  01/06/2024   ROTATOR CUFF REPAIR Bilateral    FAMILY HISTORY History reviewed. No pertinent family history.  SOCIAL HISTORY Social  History   Tobacco Use   Smoking status: Never  Vaping Use   Vaping status: Unknown  Substance Use Topics   Alcohol use: No   Drug use: No       OPHTHALMIC EXAM:  Base Eye Exam     Visual Acuity (Snellen - Linear)       Right Left   Dist cc 20/40 20/40 -1   Dist ph cc NI NI         Tonometry (Tonopen, 1:51 PM)       Right Left   Pressure 15 17         Pupils       Pupils Dark Light Shape React APD   Right PERRL 3 2 Round Brisk None   Left PERRL 3 2 Round Brisk None         Visual Fields       Left Right    Full Full         Extraocular Movement       Right Left    Full, Ortho Full, Ortho         Neuro/Psych     Oriented x3: Yes   Mood/Affect: Normal         Dilation     Both eyes: 2.5% Phenylephrine @ 1:51 PM           Slit Lamp and Fundus Exam     Slit Lamp Exam       Right Left   Lids/Lashes Dermatochalasis - upper lid, mild MGD Dermatochalasis - upper lid, mild MGD   Conjunctiva/Sclera nasal pingeucula nasal pingeucula   Cornea arcus, trace Punctate epithelial erosions arcus, trace Punctate epithelial erosions   Anterior Chamber Deep and quiet Deep and quiet   Iris Round and poorly dilated Round and poorly dilated   Lens PC IOL in good position PC IOL in good position   Anterior Vitreous Vitreous syneresis, PVD Vitreous syneresis         Fundus Exam       Right Left   Disc mild Pallor, Sharp rim mild Pallor, Sharp rim, +PPA, Thin superior rim   C/D Ratio 0.5 0.6   Macula Flat, Blunted foveal reflex, Drusen, RPE mottling, clumping and central atrophy, No heme or edema Flat, Blunted foveal reflex, Drusen, RPE mottling and clumping, central PED and heme --improved, central cystic changes and edema-stably improved.   Vessels attenuated, Tortuous attenuated, mild tortuosity   Periphery Attached, mild reticular degeneration, mild peripheral drusen, No heme Attached, mild reticular degeneration, peripheral paving stone  degeneration, No heme           Refraction     Wearing Rx       Sphere Cylinder Axis  Add   Right -2.25 +3.25 180 +3.00   Left -1.25 +3.25 171 +3.00    Type: Bifocal           IMAGING AND PROCEDURES  Imaging and Procedures for 01/12/2024  OCT, Retina - OU - Both Eyes       Right Eye Quality was good. Central Foveal Thickness: 291. Progression has been stable. Findings include normal foveal contour, no SRF, retinal drusen , intraretinal fluid, outer retinal atrophy (Patchy ORA, trace cystic changes centrally---persistent ).   Left Eye Quality was good. Central Foveal Thickness: 243. Progression has been stable. Findings include normal foveal contour, no IRF, no SRF, retinal drusen , subretinal hyper-reflective material, intraretinal hyper-reflective material, pigment epithelial detachment, outer retinal atrophy (Stable improvement in central PEDs and SRHM; stable improvement in IRF/cystic changes , patchy ORA).   Notes *Images captured and stored on drive  Diagnosis / Impression:  OD: Nonexudative ARMD - Patchy ORA, trace cystic changes centrally-persistent OS: ex ARMD - Stable improvement in central PEDs and SRHM; stable improvement in IRF/cystic changes , patchy ORA  Clinical management:  See below  Abbreviations: NFP - Normal foveal profile. CME - cystoid macular edema. PED - pigment epithelial detachment. IRF - intraretinal fluid. SRF - subretinal fluid. EZ - ellipsoid zone. ERM - epiretinal membrane. ORA - outer retinal atrophy. ORT - outer retinal tubulation. SRHM - subretinal hyper-reflective material. IRHM - intraretinal hyper-reflective material      Intravitreal Injection, Pharmacologic Agent - OS - Left Eye       Time Out 01/12/2024. 2:33 PM. Confirmed correct patient, procedure, site, and patient consented.   Anesthesia Topical anesthesia was used. Anesthetic medications included Lidocaine  2%, Proparacaine 0.5%.   Procedure Preparation included 5%  betadine to ocular surface, eyelid speculum. A supplied (32g) needle was used.   Injection: 1.25 mg Bevacizumab  1.25mg /0.10ml   Route: Intravitreal, Site: Left Eye   NDC: [id number], Lot: [id number], Expiration date: 02/13/2024   Post-op Post injection exam found visual acuity of at least counting fingers. The patient tolerated the procedure well. There were no complications. The patient received written and verbal post procedure care education.            ASSESSMENT/PLAN:    ICD-10-CM   1. Exudative age-related macular degeneration of left eye with active choroidal neovascularization (HCC)  H35.3221 OCT, Retina - OU - Both Eyes    Intravitreal Injection, Pharmacologic Agent - OS - Left Eye    Bevacizumab  (AVASTIN ) SOLN 1.25 mg    2. Intermediate stage nonexudative age-related macular degeneration of right eye  H35.3112     3. Essential hypertension  I10     4. Hypertensive retinopathy of both eyes  H35.033     5. Pseudophakia, both eyes  Z96.1      Exudative age related macular degeneration, left eye    - interval conversion to exu ARMD OS on 01.22.25 exam -- new heme on exam, BCVA OS 20/30 from 20/20 - s/p IVA OS #1 (01.22.25), #2 (02.18.25), #3 (03.18.25), #4 (04.15.25), #5 (05.13.25), #6 (06.18.25), #7 (07.22.25)  - today BCVA OS stable at 20/25 - OCT OS Stable improvement in central PEDs and SRHM; stable improvement in IRF/cystic changes, patchy ORA at 6 wks  - recommend IVA OS #8 today, 09.02.25 w/ f/u ext to 8 wks   - pt wishes to be treated with IVA  - RBA of procedure discussed, questions answered - IVA informed consent obtained and signed, 01.22.25, - see procedure note - Eylea  approved for 2025 -- no funding for Good Days available  - f/u in 8 wks -- DFE/OCT, possible injxn  2. Age related macular degeneration, non-exudative, right eye  - intermediate stage w/ no IRF/SRF OU -- stable - OCT shows OD: Patchy ORA, trace cystic changes; OS: Central PEDs and patchy ORA;  focal cystic changes centrally  - The incidence, anatomy, and pathology of dry AMD, risk of progression, and the AREDS and AREDS 2 study including smoking risks discussed with patient.  - Recommend amsler grid monitoring  - monitor  3,4. Hypertensive retinopathy OU - discussed importance of tight BP control - monitor  5. Pseudophakia OU  - s/p CE/IOL OU  - IOLs in good position, doing well  - monitor  Ophthalmic Meds Ordered this visit:  Meds ordered this encounter  Medications   Bevacizumab  (AVASTIN ) SOLN 1.25 mg     Return in about 8 weeks (around 03/08/2024) for exu ARMD OS, DFE, OCT, Possible Injxn.  There are no Patient Instructions on file for this visit.   Explained the diagnoses, plan, and follow up with the patient and they expressed understanding.  Patient expressed understanding of the importance of proper follow up care.   This document serves as a record of services personally performed by Redell JUDITHANN Hans, MD, PhD. It was created on their behalf by Wanda GEANNIE Keens, COT an ophthalmic technician. The creation of this record is the provider's dictation and/or activities during the visit.    Electronically signed by:  Wanda GEANNIE Keens, COT  01/12/24 9:51 PM  This document serves as a record of services personally performed by Redell JUDITHANN Hans, MD, PhD. It was created on their behalf by Almetta Pesa, an ophthalmic technician. The creation of this record is the provider's dictation and/or activities during the visit.    Electronically signed by: Almetta Pesa, OA, 01/12/24  9:51 PM  Redell JUDITHANN Hans, M.D., Ph.D. Diseases & Surgery of the Retina and Vitreous [facility]  I have reviewed the above documentation for accuracy and completeness, and I agree with the above. Redell JUDITHANN Hans, M.D., Ph.D. 01/12/24 10:04 PM   Abbreviations: M myopia (nearsighted); A astigmatism; H hyperopia (farsighted); P presbyopia; Mrx spectacle  prescription;  CTL contact lenses; OD right eye; OS left eye; OU both eyes  XT exotropia; ET esotropia; PEK punctate epithelial keratitis; PEE punctate epithelial erosions; DES dry eye syndrome; MGD meibomian gland dysfunction; ATs artificial tears; PFAT's preservative free artificial tears; NSC nuclear sclerotic cataract; PSC posterior subcapsular cataract; ERM epi-retinal membrane; PVD posterior vitreous detachment; RD retinal detachment; DM diabetes mellitus; DR diabetic retinopathy; NPDR non-proliferative diabetic retinopathy; PDR proliferative diabetic retinopathy; CSME clinically significant macular edema; DME diabetic macular edema; dbh dot blot hemorrhages; CWS cotton wool spot; POAG primary open angle glaucoma; C/D cup-to-disc ratio; HVF humphrey visual field; GVF goldmann visual field; OCT optical coherence tomography; IOP intraocular pressure; BRVO Branch retinal vein occlusion; CRVO central retinal vein occlusion; CRAO central retinal artery occlusion; BRAO branch retinal artery occlusion; RT retinal tear; SB scleral buckle; PPV pars plana vitrectomy; VH Vitreous hemorrhage; PRP panretinal laser photocoagulation; IVK intravitreal kenalog; VMT vitreomacular traction; MH Macular hole;  NVD neovascularization of the disc; NVE neovascularization elsewhere; AREDS age related eye disease study; ARMD age related macular degeneration; POAG primary open angle glaucoma; EBMD epithelial/anterior basement membrane dystrophy; ACIOL anterior chamber intraocular lens; IOL intraocular lens; PCIOL posterior chamber intraocular lens; Phaco/IOL phacoemulsification with intraocular lens placement; PRK photorefractive keratectomy; LASIK laser assisted  in situ keratomileusis; HTN hypertension; DM diabetes mellitus; COPD chronic obstructive pulmonary disease

## 2024-01-12 ENCOUNTER — Ambulatory Visit (INDEPENDENT_AMBULATORY_CARE_PROVIDER_SITE_OTHER): Admitting: Ophthalmology

## 2024-01-12 ENCOUNTER — Encounter (INDEPENDENT_AMBULATORY_CARE_PROVIDER_SITE_OTHER): Payer: Self-pay | Admitting: Ophthalmology

## 2024-01-12 DIAGNOSIS — H353221 Exudative age-related macular degeneration, left eye, with active choroidal neovascularization: Secondary | ICD-10-CM | POA: Diagnosis not present

## 2024-01-12 DIAGNOSIS — H353112 Nonexudative age-related macular degeneration, right eye, intermediate dry stage: Secondary | ICD-10-CM | POA: Diagnosis not present

## 2024-01-12 DIAGNOSIS — H35033 Hypertensive retinopathy, bilateral: Secondary | ICD-10-CM

## 2024-01-12 DIAGNOSIS — I10 Essential (primary) hypertension: Secondary | ICD-10-CM

## 2024-01-12 DIAGNOSIS — Z961 Presence of intraocular lens: Secondary | ICD-10-CM

## 2024-01-12 MED ORDER — BEVACIZUMAB CHEMO INJECTION 1.25MG/0.05ML SYRINGE FOR KALEIDOSCOPE
1.2500 mg | INTRAVITREAL | Status: AC | PRN
  Administered 2024-01-12: 1.25 mg via INTRAVITREAL

## 2024-01-22 ENCOUNTER — Ambulatory Visit (HOSPITAL_COMMUNITY): Admitting: Vascular Surgery

## 2024-01-22 ENCOUNTER — Ambulatory Visit (HOSPITAL_COMMUNITY)

## 2024-01-25 ENCOUNTER — Other Ambulatory Visit (HOSPITAL_COMMUNITY): Payer: Self-pay

## 2024-01-25 ENCOUNTER — Other Ambulatory Visit: Payer: Self-pay | Admitting: Interventional Radiology

## 2024-01-25 DIAGNOSIS — I70223 Atherosclerosis of native arteries of extremities with rest pain, bilateral legs: Secondary | ICD-10-CM

## 2024-02-08 ENCOUNTER — Ambulatory Visit
Admission: RE | Admit: 2024-02-08 | Discharge: 2024-02-08 | Disposition: A | Discharge status: Home / Self Care | Source: Ambulatory Visit | Attending: Interventional Radiology | Admitting: Interventional Radiology

## 2024-02-08 DIAGNOSIS — I70221 Atherosclerosis of native arteries of extremities with rest pain, right leg: Secondary | ICD-10-CM | POA: Diagnosis not present

## 2024-02-08 DIAGNOSIS — I70223 Atherosclerosis of native arteries of extremities with rest pain, bilateral legs: Secondary | ICD-10-CM

## 2024-02-21 NOTE — Progress Notes (Signed)
 Referring Physician(s): Self-Referral   Chief Complaint: The patient is seen in follow up today s/p revascularization of the right lower extremity 12/24/23 and 01/06/24  History of present illness: HPI from initial consultation 12/11/23 [name] is an 89 y.o. male with a medical history significant for HTN, stroke and macular degeneration. He also has a history of a chronic right  posterior ankle wound that took approximately 2 years to heal, first noticed in 2022. He presented to his dermatologist in May 2025 for skin assessment and a biopsy was taken from a right lateral ankle lesion. This resulted in severe pain since, and the cutaneous defect has increased in size by approximately 5x per the patient's wife, who primarily takes care of the wound. He was receiving care at the wound care clinic, however given her prior experience with his other wound, she provides twice a day dressing changes. He is very careful to keep the wound clean. The pain from the wound is excruciating, and has severely affected his sleeping patterns as well as his mobility and ability to participate in chores around his property where there is an events center and [place].     His nerve pain has been treated with Lyrica with some mild improvements. His PCP prescribed a course of antibiotics and ordered ABIs to assess blood flow. The ABI study was completed 11/26/23 and this showed moderate right lower extremity arterial disease, however monophasic waveforms bilaterally and calcifications making the ABI measurements erroneous. We ordered a CTA runoff which was completed on 12/07/23.     He reports a history of mild hyperlipidemia but refuses cholesterol lowering medication.  He takes 81 mg aspirin  daily.  He has a remote smoking history.  He has hypertension, well controlled on single antihypertensive.  He was remaining very active on his property prior to the development of this wound. No fevers or chills. No specific  cramping when ambulating.  We discussed the gravity of his enlarging wound and necessity of revascularization to prevent osteomyelitis and amputation. We discussed techniques to address both the iliac and TPT lesions, including placement of a covered stent in the right common iliac artery and revascularizing the right TPT occlusion to improve flow to the peroneal artery angiosome lesion on the lateral ankle. These would be staged procedures, starting with the inflow then addressing the outflow. We discussed initiating dual antiplatelet therapy. The patient and his wife were in agreement to proceed.   On 12/24/23 a right lower extremity angiogram revealed an approximate 50% stenosis of the right common iliac artery and a covered, balloon expandable stent was placed. The angiogram also showed short segment occlusion of the tibioperoneal trunk and long segment chronic occlusion of the anterior tibial artery.   On 01/06/24 he underwent a technically successful intraluminal recanalization with laser atherectomy of the distal popliteal, tibioperoneal trunk, and proximal peroneal arteries followed by plain balloon angioplasty of the same vessels. I also performed drug coated balloon angioplasty of the distal popliteal and tibioperoneal trunk.   The patient tolerated both of these procedures well and he presents to the IR clinic today for follow up. A repeat ABI study was completed 02/08/24.    Past Medical History:  Diagnosis Date   Hypertension    Macular degeneration     Past Surgical History:  Procedure Laterality Date   CATARACT EXTRACTION     EYE SURGERY     IR ANGIOGRAM EXTREMITY RIGHT  12/24/2023   IR ANGIOGRAM EXTREMITY RIGHT  01/06/2024  IR ANGIOGRAM PELVIS SELECTIVE OR SUPRASELECTIVE  12/24/2023   IR FEM POP ART ATHERECT INC PTA MOD SED  01/06/2024   IR ILIAC ART STENT INC PTA MOD SED  12/24/2023   IR INTRAVASCULAR ULTRASOUND NON CORONARY  12/24/2023   IR RADIOLOGIST EVAL & MGMT  12/11/2023    IR TIB-PERO ART ATHER INC PTA EA ADD VESSEL MOD SED  01/06/2024   IR TIB-PERO ART ATHEREC INC PTA MOD SED  01/06/2024   IR US  GUIDE VASC ACCESS RIGHT  12/24/2023   IR US  GUIDE VASC ACCESS RIGHT  01/06/2024   ROTATOR CUFF REPAIR Bilateral     Allergies: Prednisone  Medications: Prior to Admission medications   Medication Sig Start Date End Date Taking? Authorizing Provider  Ascorbic Acid (VITAMIN C PO) Take 1 tablet by mouth every Monday, Wednesday, and Friday. Takes along with vit e    [provider]  aspirin  EC 81 MG tablet Take 1 tablet (81 mg total) by mouth daily. Swallow whole. 06/19/22   Ghimire, [name], MD  b complex vitamins capsule Take 1 capsule by mouth daily in the afternoon.    [provider]  Calcium  Carb-Cholecalciferol (CALCIUM  600 + D PO) Take 1 tablet by mouth 3 (three) times a week.    [provider]  clopidogrel  (PLAVIX ) 75 MG tablet Take 1 tablet (75 mg total) by mouth daily. 12/25/23   Covington, Jamie R, NP  Glucosamine HCl (GLUCOSAMINE PO) Take 1 tablet by mouth in the morning and at bedtime.    [provider]  lisinopril -hydrochlorothiazide  (ZESTORETIC ) 20-12.5 MG tablet Take 1 tablet by mouth daily. 06/19/22   Ghimire, [name], MD  lisinopril -hydrochlorothiazide  (ZESTORETIC ) 20-25 MG tablet Take 1 tablet by mouth daily.    [provider]  Multiple Vitamins-Minerals (PRESERVISION AREDS PO) Take 1 tablet by mouth daily in the afternoon.    [provider]  Omega-3 Fatty Acids (FISH OIL) 1000 MG CAPS Take 1,000 mg by mouth daily.    [provider]  Potassium (POTASSIMIN PO) Take 1 tablet by mouth in the morning and at bedtime.    [provider]  vitamin E 180 MG (400 UNITS) capsule Take 400 Units by mouth every Monday, Wednesday, and Friday.    [provider]     No family history on file.  Social History   Socioeconomic History   Marital status: Married    Spouse name: Not on  file   Number of children: Not on file   Years of education: Not on file   Highest education level: Not on file  Occupational History   Not on file  Tobacco Use   Smoking status: Never   Smokeless tobacco: Not on file  Vaping Use   Vaping status: Unknown  Substance and Sexual Activity   Alcohol use: No   Drug use: No   Sexual activity: Not on file  Other Topics Concern   Not on file  Social History Narrative   Not on file   Social Drivers of Health   Financial Resource Strain: Not on file  Food Insecurity: Not on file  Transportation Needs: Not on file  Physical Activity: Not on file  Stress: Not on file  Social Connections: Not on file     Vital Signs: There were no vitals taken for this visit.  Physical Exam   Imaging:  ABI 11/26/23 BI Findings:  +---------+------------------+-----+----------+--------+  Right   Rt Pressure (mmHg)IndexWaveform  Comment   +---------+------------------+-----+----------+--------+  Brachial  154                                        +---------+------------------+-----+----------+--------+  ATA     95                0.62 monophasic          +---------+------------------+-----+----------+--------+  PTA     113               0.73 monophasic          +---------+------------------+-----+----------+--------+  Great Toe36                0.23                     +---------+------------------+-----+----------+--------+   +---------+------------------+-----+----------+-------+  Left    Lt Pressure (mmHg)IndexWaveform  Comment  +---------+------------------+-----+----------+-------+  Brachial 154                                       +---------+------------------+-----+----------+-------+  ATA     148               0.96 monophasic         +---------+------------------+-----+----------+-------+  PTA     150               0.97 monophasic          +---------+------------------+-----+----------+-------+  Great Toe71                0.46                    +---------+------------------+-----+----------+-------+   +-------+-----------+-----------+------------+------------+  ABI/TBIToday's ABIToday's TBIPrevious ABIPrevious TBI  +-------+-----------+-----------+------------+------------+  Right 0.73       0.23       1.12        0.65          +-------+-----------+-----------+------------+------------+  Left  0.97       0.48       1.13        0.52          +-------+-----------+-----------+------------+------------+     Previous ABI on 07/07/20 at [facility] imaging.    Summary:  Right: Resting right ankle-brachial index indicates moderate right lower  extremity arterial disease. The right toe-brachial index is abnormal.   Left: The left toe-brachial index is abnormal.  Although ankle brachial indices are within normal limits (0.95-1.29),  arterial Doppler waveforms at the ankle suggest some component of arterial  occlusive disease.      CTA Runoff (12/07/23)  Right common iliac artery plaque, ~50% stenosis    Tibioperoneal trunk short segment occlusion, severe multifocal stenoses of the AT  Labs:  CBC: Recent Labs    12/24/23 0847 01/06/24 0906  WBC 8.7 9.9  HGB 12.5* 12.3*  HCT 37.7* 37.4*  PLT 306 302    COAGS: Recent Labs    12/24/23 0847 01/06/24 0906  INR 1.0 1.1    BMP: Recent Labs    12/24/23 0847  NA 141  K 4.1  CL 106  CO2 30  GLUCOSE 98  BUN 28*  CALCIUM  8.9  CREATININE 1.02  GFRNONAA >60    LIVER FUNCTION TESTS: No results for input(s): BILITOT, AST, ALT, ALKPHOS, PROT, ALBUMIN in the last 8760 hours.  Assessment and Plan:  89 year old male with a medical history significant for a non-healing right ankle wound present since May 2025 with associated right lower extremity peripheral artery disease, specifically a 50% stenosis in the right common iliac  artery and a short segment occlusion in the right tibioperoneal trunk. He underwent revascularization of the right lower extremity with two staged procedures performed 8/14 and 8/17.    Ester Sides, MD Pager: [phone]    I spent a total of 25 Minutes in face to face in clinical consultation, greater than 50% of which was counseling/coordinating care for non-healing right lower extremity wound.

## 2024-02-22 ENCOUNTER — Inpatient Hospital Stay
Admission: RE | Admit: 2024-02-22 | Discharge: 2024-02-22 | Disposition: A | Source: Ambulatory Visit | Attending: Interventional Radiology

## 2024-02-22 DIAGNOSIS — I70223 Atherosclerosis of native arteries of extremities with rest pain, bilateral legs: Secondary | ICD-10-CM

## 2024-02-22 DIAGNOSIS — I7025 Atherosclerosis of native arteries of other extremities with ulceration: Secondary | ICD-10-CM | POA: Diagnosis not present

## 2024-02-22 DIAGNOSIS — L97319 Non-pressure chronic ulcer of right ankle with unspecified severity: Secondary | ICD-10-CM | POA: Diagnosis not present

## 2024-02-22 HISTORY — PX: IR RADIOLOGIST EVAL & MGMT: IMG5224

## 2024-02-25 ENCOUNTER — Ambulatory Visit: Admitting: Podiatry

## 2024-02-25 VITALS — Ht 61.0 in | Wt 123.0 lb

## 2024-02-25 DIAGNOSIS — L97915 Non-pressure chronic ulcer of unspecified part of right lower leg with muscle involvement without evidence of necrosis: Secondary | ICD-10-CM

## 2024-02-25 MED ORDER — LIDOCAINE 5 % EX PTCH: 1.0000 | MEDICATED_PATCH | CUTANEOUS | 0 refills | Status: AC

## 2024-02-25 MED ORDER — GENTAMICIN SULFATE 0.1 % EX OINT: 1.0000 | TOPICAL_OINTMENT | Freq: Every day | CUTANEOUS | 2 refills | Status: AC

## 2024-02-25 NOTE — Progress Notes (Addendum)
 [facility] - Clinic Note  03/08/2024     CHIEF COMPLAINT Patient presents for Retina Follow Up   HISTORY OF PRESENT ILLNESS: [name] is a 89 y.o. male who presents to the clinic today for:   HPI     Retina Follow Up   Patient presents with  Wet AMD.  In left eye.  This started 8 weeks ago.  I, the attending physician,  performed the HPI with the patient and updated documentation appropriately.        Comments   Patient here for 8 weeks retina follow up for exu ARMD OS. Patient states forgot glasses. Vision about the like usual. About the same. No eye pain.       Last edited by Valdemar Rogue, MD on 03/08/2024  5:34 PM.      Referring physician: Cleatus Collar, MD [address] [place],  [place] [address]  HISTORICAL INFORMATION:   Selected notes from the MEDICAL RECORD NUMBER Referred by Dr. Cleatus for concern of exu ARMD  LEE:  Ocular Hx- PMH-    CURRENT MEDICATIONS: No current outpatient medications on file. (Ophthalmic Drugs)   No current facility-administered medications for this visit. (Ophthalmic Drugs)   Current Outpatient Medications (Other)  Medication Sig   Ascorbic Acid (VITAMIN C PO) Take 1 tablet by mouth every Monday, Wednesday, and Friday. Takes along with vit e   aspirin  EC 81 MG tablet Take 1 tablet (81 mg total) by mouth daily. Swallow whole.   b complex vitamins capsule Take 1 capsule by mouth daily in the afternoon.   Calcium  Carb-Cholecalciferol (CALCIUM  600 + D PO) Take 1 tablet by mouth 3 (three) times a week.   clopidogrel  (PLAVIX ) 75 MG tablet Take 1 tablet (75 mg total) by mouth daily.   gentamicin ointment (GARAMYCIN) 0.1 % Apply 1 Application topically daily. Apply to wound daily   Glucosamine HCl (GLUCOSAMINE PO) Take 1 tablet by mouth in the morning and at bedtime.   lidocaine  (LIDODERM ) 5 % Place 1 patch onto the skin daily. Remove & Discard patch within 12 hours or as directed by MD    lisinopril -hydrochlorothiazide  (ZESTORETIC ) 20-12.5 MG tablet Take 1 tablet by mouth daily.   lisinopril -hydrochlorothiazide  (ZESTORETIC ) 20-25 MG tablet Take 1 tablet by mouth daily.   Multiple Vitamins-Minerals (PRESERVISION AREDS PO) Take 1 tablet by mouth daily in the afternoon.   Omega-3 Fatty Acids (FISH OIL) 1000 MG CAPS Take 1,000 mg by mouth daily.   Potassium (POTASSIMIN PO) Take 1 tablet by mouth in the morning and at bedtime.   vitamin E 180 MG (400 UNITS) capsule Take 400 Units by mouth every Monday, Wednesday, and Friday.   No current facility-administered medications for this visit. (Other)   REVIEW OF SYSTEMS: ROS   Positive for: Eyes Negative for: Constitutional, Gastrointestinal, Neurological, Skin, Genitourinary, Musculoskeletal, HENT, Endocrine, Cardiovascular, Respiratory, Psychiatric, Allergic/Imm, Heme/Lymph Last edited by Orval Asberry RAMAN, COA on 03/08/2024  1:57 PM.         ALLERGIES Allergies  Allergen Reactions   Prednisone Other (See Comments)    Unknown    PAST MEDICAL HISTORY Past Medical History:  Diagnosis Date   Hypertension    Macular degeneration    Past Surgical History:  Procedure Laterality Date   CATARACT EXTRACTION     EYE SURGERY     IR ANGIOGRAM EXTREMITY RIGHT  12/24/2023   IR ANGIOGRAM EXTREMITY RIGHT  01/06/2024   IR ANGIOGRAM PELVIS SELECTIVE OR SUPRASELECTIVE  12/24/2023   IR FEM POP ART ATHERECT INC PTA MOD SED  01/06/2024   IR ILIAC ART STENT INC PTA MOD SED  12/24/2023   IR INTRAVASCULAR ULTRASOUND NON CORONARY  12/24/2023   IR RADIOLOGIST EVAL & MGMT  12/11/2023   IR RADIOLOGIST EVAL & MGMT  02/22/2024   IR TIB-PERO ART ATHER INC PTA EA ADD VESSEL MOD SED  01/06/2024   IR TIB-PERO ART ATHEREC INC PTA MOD SED  01/06/2024   IR US  GUIDE VASC ACCESS RIGHT  12/24/2023   IR US  GUIDE VASC ACCESS RIGHT  01/06/2024   ROTATOR CUFF REPAIR Bilateral    FAMILY HISTORY History reviewed. No pertinent family history.  SOCIAL HISTORY Social  History   Tobacco Use   Smoking status: Never  Vaping Use   Vaping status: Unknown  Substance Use Topics   Alcohol use: No   Drug use: No       OPHTHALMIC EXAM:  Base Eye Exam     Visual Acuity (Snellen - Linear)       Right Left   Dist [place] 20/60 20/60   Dist ph [place] 20/30 20/30  Patient forgot glasses        Tonometry (Tonopen, 1:54 PM)       Right Left   Pressure 20 21         Pupils       Dark Light Shape React APD   Right 3 2 Round Brisk None   Left 3 2 Round Brisk None         Visual Fields (Counting fingers)       Left Right    Full Full         Extraocular Movement       Right Left    Full, Ortho Full, Ortho         Neuro/Psych     Oriented x3: Yes   Mood/Affect: Normal         Dilation     Both eyes: 1.0% Mydriacyl, 2.5% Phenylephrine @ 1:54 PM           Slit Lamp and Fundus Exam     Slit Lamp Exam       Right Left   Lids/Lashes Dermatochalasis - upper lid, mild MGD Dermatochalasis - upper lid, mild MGD   Conjunctiva/Sclera nasal pingeucula nasal pingeucula   Cornea arcus, trace Punctate epithelial erosions arcus, trace Punctate epithelial erosions   Anterior Chamber Deep and quiet Deep and quiet   Iris Round and poorly dilated Round and poorly dilated   Lens PC IOL in good position PC IOL in good position   Anterior Vitreous Vitreous syneresis, PVD Vitreous syneresis         Fundus Exam       Right Left   Disc mild Pallor, Sharp rim mild Pallor, Sharp rim, +PPA, Thin superior rim   C/D Ratio 0.5 0.6   Macula Flat, Blunted foveal reflex, Drusen, RPE mottling, clumping and central atrophy, No heme or edema Flat, Blunted foveal reflex, Drusen, RPE mottling and clumping, central PED and heme --improved, central cystic changes and edema-stably improved.   Vessels attenuated, Tortuous attenuated, mild tortuosity   Periphery Attached, mild reticular degeneration, mild peripheral drusen, No heme Attached, mild reticular  degeneration, peripheral paving stone degeneration, No heme           Refraction     Wearing Rx       Sphere Cylinder Axis Add   Right -2.25 +3.25  180 +3.00   Left -1.25 +3.25 171 +3.00    Type: Bifocal           IMAGING AND PROCEDURES  Imaging and Procedures for 03/08/2024  OCT, Retina - OU - Both Eyes       Right Eye Quality was good. Central Foveal Thickness: 292. Progression has been stable. Findings include normal foveal contour, no SRF, retinal drusen , intraretinal fluid, outer retinal atrophy (Patchy ORA, trace cystic changes centrally---persistent ).   Left Eye Quality was good. Central Foveal Thickness: 248. Progression has been stable. Findings include normal foveal contour, no IRF, no SRF, retinal drusen , subretinal hyper-reflective material, intraretinal hyper-reflective material, pigment epithelial detachment, outer retinal atrophy (Stable improvement in central PEDs and SRHM; stable improvement in IRF/cystic changes , patchy ORA).   Notes *Images captured and stored on drive  Diagnosis / Impression:  OD: Nonexudative ARMD - Patchy ORA, trace cystic changes centrally-persistent OS: ex ARMD - Stable improvement in central PEDs and SRHM; stable improvement in IRF/cystic changes , patchy ORA  Clinical management:  See below  Abbreviations: NFP - Normal foveal profile. CME - cystoid macular edema. PED - pigment epithelial detachment. IRF - intraretinal fluid. SRF - subretinal fluid. EZ - ellipsoid zone. ERM - epiretinal membrane. ORA - outer retinal atrophy. ORT - outer retinal tubulation. SRHM - subretinal hyper-reflective material. IRHM - intraretinal hyper-reflective material      Intravitreal Injection, Pharmacologic Agent - OS - Left Eye       Time Out 03/08/2024. 2:41 PM. Confirmed correct patient, procedure, site, and patient consented.   Anesthesia Topical anesthesia was used. Anesthetic medications included Lidocaine  2%, Proparacaine 0.5%.    Procedure Preparation included 5% betadine to ocular surface, eyelid speculum. A (32g) needle was used.   Injection: 1.25 mg Bevacizumab  1.25mg /0.26ml   Route: Intravitreal, Site: Left Eye   NDC: [id number], Lot: [id number], Expiration date: 06/16/2024   Post-op Post injection exam found visual acuity of at least counting fingers. The patient tolerated the procedure well. There were no complications. The patient received written and verbal post procedure care education.             ASSESSMENT/PLAN:    ICD-10-CM   1. Exudative age-related macular degeneration of left eye with active choroidal neovascularization (HCC)  H35.3221 OCT, Retina - OU - Both Eyes    Intravitreal Injection, Pharmacologic Agent - OS - Left Eye    Bevacizumab  (AVASTIN ) SOLN 1.25 mg    2. Intermediate stage nonexudative age-related macular degeneration of right eye  H35.3112     3. Essential hypertension  I10     4. Hypertensive retinopathy of both eyes  H35.033     5. Pseudophakia, both eyes  Z96.1      Exudative age related macular degeneration, left eye    - interval conversion to exu ARMD OS on 01.22.25 exam -- new heme on exam, BCVA OS 20/30 from 20/20 - s/p IVA OS #1 (01.22.25), #2 (02.18.25), #3 (03.18.25), #4 (04.15.25), #5 (05.13.25), #6 (06.18.25), #7 (07.22.25). #8 (09.02.25)  - today BCVA OS 20/30 from 20/40 - OCT OS Stable improvement in central PEDs and SRHM; stable improvement in IRF/cystic changes, patchy ORA at 8 wks  - recommend IVA OS #9 today, 10.28.25 w/ f/u ext to 10 wks   - pt wishes to be treated with IVA  - RBA of procedure discussed, questions answered - IVA informed consent obtained and signed, 01.22.25, - see procedure note - Eylea approved for 2025 --  no funding for Good Days available  - f/u in 10 wks -- DFE/OCT, possible injxn  2. Age related macular degeneration, non-exudative, right eye  - intermediate stage w/ no IRF/SRF OU -- stable - OCT shows OD: Patchy ORA,  trace cystic changes; OS: Central PEDs and patchy ORA; focal cystic changes centrally  - The incidence, anatomy, and pathology of dry AMD, risk of progression, and the AREDS and AREDS 2 study including smoking risks discussed with patient.  - Recommend amsler grid monitoring  - monitor  3,4. Hypertensive retinopathy OU - discussed importance of tight BP control - monitor  5. Pseudophakia OU  - s/p CE/IOL OU  - IOLs in good position, doing well  - monitor  Ophthalmic Meds Ordered this visit:  Meds ordered this encounter  Medications   Bevacizumab  (AVASTIN ) SOLN 1.25 mg     Return in about 10 weeks (around 05/17/2024) for exu ARMD OS, DFE, OCT, Possible Injxn.  There are no Patient Instructions on file for this visit.   Explained the diagnoses, plan, and follow up with the patient and they expressed understanding.  Patient expressed understanding of the importance of proper follow up care.   This document serves as a record of services personally performed by Redell JUDITHANN Hans, MD, PhD. It was created on their behalf by Wanda GEANNIE Keens, COT an ophthalmic technician. The creation of this record is the provider's dictation and/or activities during the visit.    Electronically signed by:  Wanda GEANNIE Keens, COT  03/13/24 10:06 AM  This document serves as a record of services personally performed by Redell JUDITHANN Hans, MD, PhD. It was created on their behalf by Almetta Pesa, an ophthalmic technician. The creation of this record is the provider's dictation and/or activities during the visit.    Electronically signed by: Almetta Pesa, OA, 03/13/24  10:06 AM  Redell JUDITHANN Hans, M.D., Ph.D. Diseases & Surgery of the Retina and Vitreous [facility]  I have reviewed the above documentation for accuracy and completeness, and I agree with the above. Redell JUDITHANN Hans, M.D., Ph.D. 03/13/24 10:07 AM   Abbreviations: M myopia (nearsighted); A astigmatism; H  hyperopia (farsighted); P presbyopia; Mrx spectacle prescription;  CTL contact lenses; OD right eye; OS left eye; OU both eyes  XT exotropia; ET esotropia; PEK punctate epithelial keratitis; PEE punctate epithelial erosions; DES dry eye syndrome; MGD meibomian gland dysfunction; ATs artificial tears; PFAT's preservative free artificial tears; NSC nuclear sclerotic cataract; PSC posterior subcapsular cataract; ERM epi-retinal membrane; PVD posterior vitreous detachment; RD retinal detachment; DM diabetes mellitus; DR diabetic retinopathy; NPDR non-proliferative diabetic retinopathy; PDR proliferative diabetic retinopathy; CSME clinically significant macular edema; DME diabetic macular edema; dbh dot blot hemorrhages; CWS cotton wool spot; POAG primary open angle glaucoma; C/D cup-to-disc ratio; HVF humphrey visual field; GVF goldmann visual field; OCT optical coherence tomography; IOP intraocular pressure; BRVO Branch retinal vein occlusion; CRVO central retinal vein occlusion; CRAO central retinal artery occlusion; BRAO branch retinal artery occlusion; RT retinal tear; SB scleral buckle; PPV pars plana vitrectomy; VH Vitreous hemorrhage; PRP panretinal laser photocoagulation; IVK intravitreal kenalog; VMT vitreomacular traction; MH Macular hole;  NVD neovascularization of the disc; NVE neovascularization elsewhere; AREDS age related eye disease study; ARMD age related macular degeneration; POAG primary open angle glaucoma; EBMD epithelial/anterior basement membrane dystrophy; ACIOL anterior chamber intraocular lens; IOL intraocular lens; PCIOL posterior chamber intraocular lens; Phaco/IOL phacoemulsification with intraocular lens placement; PRK photorefractive keratectomy; LASIK laser assisted in situ keratomileusis; HTN  hypertension; DM diabetes mellitus; COPD chronic obstructive pulmonary disease

## 2024-02-26 NOTE — Progress Notes (Signed)
 Subjective:  Patient ID: [name], male    DOB: June 20, 1932,  MRN: [id number]  Chief Complaint  Patient presents with   Wound Check    RM 19 Patient is here for nerve pain of the right foot after a biopsy procedure. Pt has a wound on the lower part of the right leg. Wound is open.    Discussed the use of AI scribe software for clinical note transcription with the patient, who gave verbal consent to proceed.  History of Present Illness [name] is an 89 year old male who presents with a chronic leg wound and nerve pain.  He has been experiencing a chronic leg wound since May 5th, 2025, following a dermatological procedure. The wound developed after a biopsy and subsequent chemotherapy injections into a growth on his leg. The wound has not improved with previous treatments, including silver sulfadiazine and hydrogen peroxide. He is currently using silver sulfadiazine on the wound and has recently started applying a mixture of hydrogen peroxide and water. He has a history of a previous wound on his leg that lasted two years and was treated at a wound care facility without success.  He reports significant nerve pain in his foot and leg, which began after a needle was inserted into a nerve during the initial procedure. The pain is severe enough to disrupt his sleep, causing him to sit up in a chair for several nights. Despite attempts to address it, he has not found relief from the discomfort.  His past medical history includes vascular interventions, including a stent in his abdomen and a balloon procedure in his leg, which have improved blood flow.      Objective:    Physical Exam VASCULAR: DP and PT pulse non palpable. Foot is warm and well-perfused. Capillary fill time is brisk. DERMATOLOGIC: Full thickness chronic ulcer on lateral mid calf with fibrous green drainage, rolled violaceous skin edges, no purulence or myeloid, 6x4 cm. Normal skin turgor, texture, and temperature. No  open lesions or rashes. NEUROLOGIC: Normal sensation to light touch and pressure. No paresthesias. ORTHOPEDIC: Smooth pain-free range of motion of all examined joints. No ecchymosis or bruising. No gross deformity. No pain to palpation.   No images are attached to the encounter.    Results PATHOLOGY Skin biopsy: Chemotherapy agent injected, no infection noted (09/14/2023)  Wound assessment: Full thickness and chronic ulcer on lateral mid-calf with fibrous green drainage, rolled, violaceous skin edges, no purulence or myeloid. Approximately six centimeters by four centimeters. (02/25/2024)      Assessment:   1. 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  Assessment and Plan Assessment & Plan Chronic full thickness non-pressure ulcer of lateral mid calf Chronic full thickness ulcer on the lateral mid-calf, present since May 5th, with fibrous green drainage and rolled, violaceous skin edges. The ulcer measures approximately 6 cm by 4 cm. The wound has not healed and requires specialized wound care. The ulcer is healing unevenly, with some areas sunken and others raised. Improved blood flow following stent and balloon procedures in the leg, but the wound persists. - Prescribe gentamicin ointment for wound application to reduce bacterial load. - Apply iodine -based gel to the wound today. - Ref[facility] to [facility]. Harden at [facility] for specialized wound care. His ulcer size and location is not amenable to treatment here   Neuropathic  pain of lower extremity Neuropathic pain in the lower extremity, likely related to nerve injury during a biopsy procedure. Pain is severe, affecting sleep, and has been persistent since May. Improved blood flow may aid in eventual pain reduction, but pain management is necessary in the interim. - Prescribe lidocaine  patches for  pain management, to be applied above and below the wound for 12 hours at a time.      Return if symptoms worsen or fail to improve.

## 2024-03-08 ENCOUNTER — Encounter (INDEPENDENT_AMBULATORY_CARE_PROVIDER_SITE_OTHER): Payer: Self-pay | Admitting: Ophthalmology

## 2024-03-08 ENCOUNTER — Ambulatory Visit (INDEPENDENT_AMBULATORY_CARE_PROVIDER_SITE_OTHER): Admitting: Ophthalmology

## 2024-03-08 DIAGNOSIS — H353221 Exudative age-related macular degeneration, left eye, with active choroidal neovascularization: Secondary | ICD-10-CM

## 2024-03-08 DIAGNOSIS — Z961 Presence of intraocular lens: Secondary | ICD-10-CM

## 2024-03-08 DIAGNOSIS — H35033 Hypertensive retinopathy, bilateral: Secondary | ICD-10-CM | POA: Diagnosis not present

## 2024-03-08 DIAGNOSIS — I10 Essential (primary) hypertension: Secondary | ICD-10-CM | POA: Diagnosis not present

## 2024-03-08 DIAGNOSIS — H353112 Nonexudative age-related macular degeneration, right eye, intermediate dry stage: Secondary | ICD-10-CM | POA: Diagnosis not present

## 2024-03-08 MED ORDER — BEVACIZUMAB CHEMO INJECTION 1.25MG/0.05ML SYRINGE FOR KALEIDOSCOPE
1.2500 mg | INTRAVITREAL | Status: AC | PRN
  Administered 2024-03-08: 1.25 mg via INTRAVITREAL

## 2024-05-03 ENCOUNTER — Other Ambulatory Visit: Payer: Self-pay | Admitting: Interventional Radiology

## 2024-05-03 DIAGNOSIS — I739 Peripheral vascular disease, unspecified: Secondary | ICD-10-CM

## 2024-05-03 NOTE — Progress Notes (Signed)
 " [facility] - Clinic Note  05/17/2024     CHIEF COMPLAINT Patient presents for Retina Follow Up   HISTORY OF PRESENT ILLNESS: [name] is a 89 y.o. male who presents to the clinic today for:   HPI     Retina Follow Up   Patient presents with  Wet AMD.  In left eye.  Severity is mild.  Duration of 10 weeks.  Since onset it is stable.  I, the attending physician,  performed the HPI with the patient and updated documentation appropriately.        Comments   10 week Retina eval. Patient states vision seems the same      Last edited by Valdemar Rogue, MD on 05/17/2024  5:28 PM.       Referring physician: Cleatus Collar, MD [address] [place],  [place] [address]  HISTORICAL INFORMATION:   Selected notes from the MEDICAL RECORD NUMBER Referred by Dr. Cleatus for concern of exu ARMD  LEE:  Ocular Hx- PMH-    CURRENT MEDICATIONS: No current outpatient medications on file. (Ophthalmic Drugs)   No current facility-administered medications for this visit. (Ophthalmic Drugs)   Current Outpatient Medications (Other)  Medication Sig   Ascorbic Acid (VITAMIN C PO) Take 1 tablet by mouth every Monday, Wednesday, and Friday. Takes along with vit e   aspirin  EC 81 MG tablet Take 1 tablet (81 mg total) by mouth daily. Swallow whole.   b complex vitamins capsule Take 1 capsule by mouth daily in the afternoon.   Calcium  Carb-Cholecalciferol (CALCIUM  600 + D PO) Take 1 tablet by mouth 3 (three) times a week.   clopidogrel  (PLAVIX ) 75 MG tablet Take 1 tablet (75 mg total) by mouth daily.   gentamicin  ointment (GARAMYCIN ) 0.1 % Apply 1 Application topically daily. Apply to wound daily   Glucosamine HCl (GLUCOSAMINE PO) Take 1 tablet by mouth in the morning and at bedtime.   lidocaine  (LIDODERM ) 5 % Place 1 patch onto the skin daily. Remove & Discard patch within 12 hours or as directed by MD   lisinopril -hydrochlorothiazide  (ZESTORETIC ) 20-12.5 MG tablet  Take 1 tablet by mouth daily.   lisinopril -hydrochlorothiazide  (ZESTORETIC ) 20-25 MG tablet Take 1 tablet by mouth daily.   Multiple Vitamins-Minerals (PRESERVISION AREDS PO) Take 1 tablet by mouth daily in the afternoon.   Omega-3 Fatty Acids (FISH OIL) 1000 MG CAPS Take 1,000 mg by mouth daily.   Potassium (POTASSIMIN PO) Take 1 tablet by mouth in the morning and at bedtime.   vitamin E 180 MG (400 UNITS) capsule Take 400 Units by mouth every Monday, Wednesday, and Friday.   No current facility-administered medications for this visit. (Other)   REVIEW OF SYSTEMS: ROS   Positive for: Eyes Negative for: Constitutional, Gastrointestinal, Neurological, Skin, Genitourinary, Musculoskeletal, HENT, Endocrine, Cardiovascular, Respiratory, Psychiatric, Allergic/Imm, Heme/Lymph Last edited by German Olam BRAVO, COT on 05/17/2024  1:46 PM.          ALLERGIES Allergies  Allergen Reactions   Prednisone Other (See Comments)    Unknown    PAST MEDICAL HISTORY Past Medical History:  Diagnosis Date   Hypertension    Macular degeneration    Past Surgical History:  Procedure Laterality Date   CATARACT EXTRACTION     EYE SURGERY     IR ANGIOGRAM EXTREMITY RIGHT  12/24/2023   IR ANGIOGRAM EXTREMITY RIGHT  01/06/2024   IR ANGIOGRAM PELVIS SELECTIVE OR SUPRASELECTIVE  12/24/2023   IR FEM POP  ART ATHERECT INC PTA MOD SED  01/06/2024   IR ILIAC ART STENT INC PTA MOD SED  12/24/2023   IR INTRAVASCULAR ULTRASOUND NON CORONARY  12/24/2023   IR RADIOLOGIST EVAL & MGMT  12/11/2023   IR RADIOLOGIST EVAL & MGMT  02/22/2024   IR RADIOLOGIST EVAL & MGMT  05/09/2024   IR TIB-PERO ART ATHER INC PTA EA ADD VESSEL MOD SED  01/06/2024   IR TIB-PERO ART ATHEREC INC PTA MOD SED  01/06/2024   IR US  GUIDE VASC ACCESS RIGHT  12/24/2023   IR US  GUIDE VASC ACCESS RIGHT  01/06/2024   ROTATOR CUFF REPAIR Bilateral    FAMILY HISTORY History reviewed. No pertinent family history.  SOCIAL HISTORY Social History   Tobacco  Use   Smoking status: Never  Vaping Use   Vaping status: Unknown  Substance Use Topics   Alcohol use: No   Drug use: No       OPHTHALMIC EXAM:  Base Eye Exam     Visual Acuity (Snellen - Linear)       Right Left   Dist cc 20/20 20/20    Correction: Glasses         Tonometry (Tonopen, 1:47 PM)       Right Left   Pressure 14 13         Pupils       Dark Light Shape React APD   Right 3 2 Round Brisk None   Left 3 2 Round Brisk None         Visual Fields (Counting fingers)       Left Right    Full Full         Extraocular Movement       Right Left    Full, Ortho Full, Ortho         Neuro/Psych     Oriented x3: Yes   Mood/Affect: Normal         Dilation     Both eyes: 1.0% Mydriacyl, 2.5% Phenylephrine @ 1:48 PM           Slit Lamp and Fundus Exam     Slit Lamp Exam       Right Left   Lids/Lashes Dermatochalasis - upper lid, mild MGD Dermatochalasis - upper lid, mild MGD   Conjunctiva/Sclera nasal pingeucula nasal pingeucula   Cornea arcus, trace Punctate epithelial erosions arcus, trace Punctate epithelial erosions   Anterior Chamber Deep and quiet Deep and quiet   Iris Round and poorly dilated Round and poorly dilated   Lens PC IOL in good position PC IOL in good position   Anterior Vitreous Vitreous syneresis, PVD Vitreous syneresis         Fundus Exam       Right Left   Disc mild Pallor, Sharp rim mild Pallor, Sharp rim, +PPA, Thin superior rim   C/D Ratio 0.5 0.6   Macula Flat, Blunted foveal reflex, Drusen, RPE mottling, clumping and central atrophy, No heme or edema Flat, Blunted foveal reflex, Drusen, RPE mottling and clumping, central PED and heme --improved, central cystic changes and edema-stably improved.   Vessels attenuated, Tortuous attenuated, mild tortuosity   Periphery Attached, mild reticular degeneration, mild peripheral drusen, No heme Attached, mild reticular degeneration, peripheral paving stone  degeneration, No heme           Refraction     Wearing Rx       Sphere Cylinder Axis Add   Right -2.25 +3.25 180 +3.00  Left -1.25 +3.25 171 +3.00    Type: Bifocal           IMAGING AND PROCEDURES  Imaging and Procedures for 05/17/2024  OCT, Retina - OU - Both Eyes       Right Eye Quality was good. Central Foveal Thickness: 292. Progression has been stable. Findings include normal foveal contour, no SRF, retinal drusen , intraretinal fluid, outer retinal atrophy (Patchy ORA, trace cystic changes centrally---persistent ).   Left Eye Quality was good. Central Foveal Thickness: 253. Progression has been stable. Findings include normal foveal contour, no IRF, no SRF, retinal drusen , subretinal hyper-reflective material, intraretinal hyper-reflective material, pigment epithelial detachment, outer retinal atrophy (Stable improvement in central PEDs and SRHM; stable improvement in IRF/cystic changes, no fluid, patchy ORA).   Notes *Images captured and stored on drive  Diagnosis / Impression:  OD: Nonexudative ARMD - Patchy ORA, trace cystic changes centrally-persistent OS: ex ARMD - Stable improvement in central PEDs and SRHM; stable improvement in IRF/cystic changes , patchy ORA  Clinical management:  See below  Abbreviations: NFP - Normal foveal profile. CME - cystoid macular edema. PED - pigment epithelial detachment. IRF - intraretinal fluid. SRF - subretinal fluid. EZ - ellipsoid zone. ERM - epiretinal membrane. ORA - outer retinal atrophy. ORT - outer retinal tubulation. SRHM - subretinal hyper-reflective material. IRHM - intraretinal hyper-reflective material      Intravitreal Injection, Pharmacologic Agent - OS - Left Eye       Time Out 05/17/2024. 2:32 PM. Confirmed correct patient, procedure, site, and patient consented.   Anesthesia Topical anesthesia was used. Anesthetic medications included Lidocaine  2%, Proparacaine 0.5%.   Procedure Preparation  included 5% betadine to ocular surface, eyelid speculum. A supplied (32g) needle was used.   Injection: 1.25 mg Bevacizumab  1.25mg /0.107ml   Route: Intravitreal, Site: Left Eye   NDC: 49757-939-98, Lot: [id number], Expiration date: 05/28/2024   Post-op Post injection exam found visual acuity of at least counting fingers. The patient tolerated the procedure well. There were no complications. The patient received written and verbal post procedure care education.            ASSESSMENT/PLAN:    ICD-10-CM   1. Exudative age-related macular degeneration of left eye with active choroidal neovascularization (HCC)  H35.3221 OCT, Retina - OU - Both Eyes    Intravitreal Injection, Pharmacologic Agent - OS - Left Eye    Bevacizumab  (AVASTIN ) SOLN 1.25 mg    2. Intermediate stage nonexudative age-related macular degeneration of right eye  H35.3112     3. Essential hypertension  I10     4. Hypertensive retinopathy of both eyes  H35.033     5. Pseudophakia, both eyes  Z96.1      Exudative age related macular degeneration, left eye   - interval conversion to exu ARMD OS on 01.22.25 exam -- new heme on exam, BCVA OS 20/20 from 20/30 - s/p IVA OS #1 (01.22.25), #2 (02.18.25), #3 (03.18.25), #4 (04.15.25), #5 (05.13.25), #6 (06.18.25), #7 (07.22.25). #8 (09.02.25), #9 (10.28.25)  - today BCVA OS 20/20 from 20/30  - OCT OS Stable improvement in central PEDs and SRHM; stable improvement in IRF/cystic changes, patchy ORA at 10 wks  - recommend IVA OS #10 today, 01.06.26 w/ f/u ext to 12 wks   - pt wishes to be treated with IVA  - RBA of procedure discussed, questions answered - IVA informed consent obtained and signed, 01.06.26, - see procedure note - Eylea approved for 2025 -- no  funding for Good Days available  - f/u in 12 wks -- DFE/OCT, possible injxn  2. Age related macular degeneration, non-exudative, right eye  - intermediate stage w/ no IRF/SRF OU -- stable - OCT shows OD: Patchy ORA, trace  cystic changes; OS: Central PEDs and patchy ORA; focal cystic changes centrally - The incidence, anatomy, and pathology of dry AMD, risk of progression, and the AREDS and AREDS 2 study including smoking risks discussed with patient.  - Recommend amsler grid monitoring  - monitor  3,4. Hypertensive retinopathy OU - discussed importance of tight BP control - monitor  5. Pseudophakia OU  - s/p CE/IOL OU  - IOLs in good position, doing well  - monitor  Ophthalmic Meds Ordered this visit:  Meds ordered this encounter  Medications   Bevacizumab  (AVASTIN ) SOLN 1.25 mg     Return in about 12 weeks (around 08/09/2024) for f/u, Ex. AMD, DFE, OCT, Possible, IVA, OS.  There are no Patient Instructions on file for this visit.   Explained the diagnoses, plan, and follow up with the patient and they expressed understanding.  Patient expressed understanding of the importance of proper follow up care.   This document serves as a record of services personally performed by Redell JUDITHANN Hans, MD, PhD. It was created on their behalf by Wanda GEANNIE Keens, COT an ophthalmic technician. The creation of this record is the provider's dictation and/or activities during the visit.    Electronically signed by:  Wanda GEANNIE Keens, COT  05/19/2024 8:54 PM  Redell JUDITHANN Hans, M.D., Ph.D. Diseases & Surgery of the Retina and Vitreous [facility]  I have reviewed the above documentation for accuracy and completeness, and I agree with the above. Redell JUDITHANN Hans, M.D., Ph.D. 05/19/2024 8:56 PM   Abbreviations: M myopia (nearsighted); A astigmatism; H hyperopia (farsighted); P presbyopia; Mrx spectacle prescription;  CTL contact lenses; OD right eye; OS left eye; OU both eyes  XT exotropia; ET esotropia; PEK punctate epithelial keratitis; PEE punctate epithelial erosions; DES dry eye syndrome; MGD meibomian gland dysfunction; ATs artificial tears; PFAT's preservative free artificial tears; NSC  nuclear sclerotic cataract; PSC posterior subcapsular cataract; ERM epi-retinal membrane; PVD posterior vitreous detachment; RD retinal detachment; DM diabetes mellitus; DR diabetic retinopathy; NPDR non-proliferative diabetic retinopathy; PDR proliferative diabetic retinopathy; CSME clinically significant macular edema; DME diabetic macular edema; dbh dot blot hemorrhages; CWS cotton wool spot; POAG primary open angle glaucoma; C/D cup-to-disc ratio; HVF humphrey visual field; GVF goldmann visual field; OCT optical coherence tomography; IOP intraocular pressure; BRVO Branch retinal vein occlusion; CRVO central retinal vein occlusion; CRAO central retinal artery occlusion; BRAO branch retinal artery occlusion; RT retinal tear; SB scleral buckle; PPV pars plana vitrectomy; VH Vitreous hemorrhage; PRP panretinal laser photocoagulation; IVK intravitreal kenalog; VMT vitreomacular traction; MH Macular hole;  NVD neovascularization of the disc; NVE neovascularization elsewhere; AREDS age related eye disease study; ARMD age related macular degeneration; POAG primary open angle glaucoma; EBMD epithelial/anterior basement membrane dystrophy; ACIOL anterior chamber intraocular lens; IOL intraocular lens; PCIOL posterior chamber intraocular lens; Phaco/IOL phacoemulsification with intraocular lens placement; PRK photorefractive keratectomy; LASIK laser assisted in situ keratomileusis; HTN hypertension; DM diabetes mellitus; COPD chronic obstructive pulmonary disease "

## 2024-05-08 NOTE — Progress Notes (Shared)
 can  Referring Physician(s): Self-Referral   Chief Complaint: The patient is seen in follow up today s/p revascularization of the right lower extremity 12/24/23 and 01/06/24   History of present illness:  [name], 89 year old male, has a medical history significant for HTN, HLD, stroke, macular degeneration, peripheral artery disease and a chronic right posterior ankle wound first identified in 2022 and treated by the wound care clinic. The wound healed after approximately 2 years but returned following a skin biopsy performed by his dermatologist May 2025. The patient experienced severe pain and the wound was progressively growing larger. His wife was performing twice daily wound care and he was prescribed antibiotics and Lyrica by his PCP. The pain from the wound was excruciating and impacted nearly all aspects of his daily life.   An ABI study 11/26/23 showed moderate right lower extremity arterial disease. A CTA runoff  12/07/23 showed a 50% stenosis in the right common iliac artery and a short segment occlusion in the right tibioperoneal trunk. The patient requested evaluation with Interventional Radiology and we first met in consultation 12/11/23. We discussed the gravity of his current situation and the importance of revascularization to prevent osteomyelitis and amputation. We discussed treatment options to improve blood flow and I recommended two staged procedures - one to address the inflow and one to address the outflow. The patient was taking a baby aspirin  and we discussed initiating dual antiplatelet therapy.   The patient and his wife were in agreement to proceed. On 12/24/23 a right lower extremity angiogram revealed an approximate 50% stenosis of the right common iliac artery and a covered, balloon expandable stent was placed. The angiogram also showed short segment occlusion of the tibioperoneal trunk and long segment chronic occlusion of the anterior tibial artery.    On 01/06/24 he  underwent a technically successful intraluminal recanalization with laser atherectomy of the distal popliteal, tibioperoneal trunk, and proximal peroneal arteries followed by plain balloon angioplasty of the same vessels. I also performed drug coated balloon angioplasty of the distal popliteal and tibioperoneal trunk. He tolerated both procedures well and he followed up with me 02/22/24. A repeat ABI study was completed 02/08/24 and this revealed normal findings. Unfortunately the patient was still having significant neurogenic pain and there had been no significant wound healing. He had been compliant with aspirin  and Plavix .    The patient and his wife were reluctant to seek assistance again from the wound care clinic but they were agreeable to meet with a podiatrist. A referral was placed to [facility] and he met with Dr. Silva 02/25/24. Due to the ulcer size and location the wound was not amenable to treatment with Dr. Darron clinic. He placed a referral to Dr. Harden at [facility] for specialized wound care. Dr. Silva performed wound care at that visit and prescribed gentamicin  ointment to reduce bacterial load. Lidocaine  patches were ordered for pain management.    The patient presents to the clinic today for follow up. Repeat ABIs were recommended but the patient and his wife requested a clinic visit first.    He has experienced no significant interval healing of the right lateral lower leg wound.  He never saw Dr. Harden.  He has not had any further imaging.  He continues to have significant pain about the wound and throughout the right foot and lower calf.  He has continued DAPT without any side effects.  Past Medical History:  Diagnosis Date   Hypertension  Macular degeneration     Past Surgical History:  Procedure Laterality Date   CATARACT EXTRACTION     EYE SURGERY     IR ANGIOGRAM EXTREMITY RIGHT  12/24/2023   IR ANGIOGRAM EXTREMITY RIGHT  01/06/2024   IR  ANGIOGRAM PELVIS SELECTIVE OR SUPRASELECTIVE  12/24/2023   IR FEM POP ART ATHERECT INC PTA MOD SED  01/06/2024   IR ILIAC ART STENT INC PTA MOD SED  12/24/2023   IR INTRAVASCULAR ULTRASOUND NON CORONARY  12/24/2023   IR RADIOLOGIST EVAL & MGMT  12/11/2023   IR RADIOLOGIST EVAL & MGMT  02/22/2024   IR TIB-PERO ART ATHER INC PTA EA ADD VESSEL MOD SED  01/06/2024   IR TIB-PERO ART ATHEREC INC PTA MOD SED  01/06/2024   IR US  GUIDE VASC ACCESS RIGHT  12/24/2023   IR US  GUIDE VASC ACCESS RIGHT  01/06/2024   ROTATOR CUFF REPAIR Bilateral     Allergies: Prednisone  Medications: Prior to Admission medications  Medication Sig Start Date End Date Taking? Authorizing Provider  Ascorbic Acid (VITAMIN C PO) Take 1 tablet by mouth every Monday, Wednesday, and Friday. Takes along with vit e    [provider]  aspirin  EC 81 MG tablet Take 1 tablet (81 mg total) by mouth daily. Swallow whole. 06/19/22   Ghimire, [name], MD  b complex vitamins capsule Take 1 capsule by mouth daily in the afternoon.    [provider]  Calcium  Carb-Cholecalciferol (CALCIUM  600 + D PO) Take 1 tablet by mouth 3 (three) times a week.    [provider]  clopidogrel  (PLAVIX ) 75 MG tablet Take 1 tablet (75 mg total) by mouth daily. 12/25/23   Covington, Jamie R, NP  gentamicin  ointment (GARAMYCIN ) 0.1 % Apply 1 Application topically daily. Apply to wound daily 02/25/24   McDonald, Adam R, DPM  Glucosamine HCl (GLUCOSAMINE PO) Take 1 tablet by mouth in the morning and at bedtime.    [provider]  lidocaine  (LIDODERM ) 5 % Place 1 patch onto the skin daily. Remove & Discard patch within 12 hours or as directed by MD 02/25/24   Silva Juliene SAUNDERS, DPM  lisinopril -hydrochlorothiazide  (ZESTORETIC ) 20-12.5 MG tablet Take 1 tablet by mouth daily. 06/19/22   Ghimire, [name], MD  lisinopril -hydrochlorothiazide  (ZESTORETIC ) 20-25 MG tablet Take 1 tablet by mouth daily.    [provider]  Multiple  Vitamins-Minerals (PRESERVISION AREDS PO) Take 1 tablet by mouth daily in the afternoon.    [provider]  Omega-3 Fatty Acids (FISH OIL) 1000 MG CAPS Take 1,000 mg by mouth daily.    [provider]  Potassium (POTASSIMIN PO) Take 1 tablet by mouth in the morning and at bedtime.    [provider]  vitamin E 180 MG (400 UNITS) capsule Take 400 Units by mouth every Monday, Wednesday, and Friday.    [provider]     No family history on file.  Social History   Socioeconomic History   Marital status: Married    Spouse name: Not on file   Number of children: Not on file   Years of education: Not on file   Highest education level: Not on file  Occupational History   Not on file  Tobacco Use   Smoking status: Never   Smokeless tobacco: Not on file  Vaping Use   Vaping status: Unknown  Substance and Sexual Activity   Alcohol use: No   Drug use: No   Sexual activity: Not  on file  Other Topics Concern   Not on file  Social History Narrative   Not on file   Social Drivers of Health   Tobacco Use: Unknown (03/08/2024)   Patient History    Smoking Tobacco Use: Never    Smokeless Tobacco Use: Unknown    Passive Exposure: Not on file  Financial Resource Strain: Not on file  Food Insecurity: Not on file  Transportation Needs: Not on file  Physical Activity: Not on file  Stress: Not on file  Social Connections: Not on file  Depression (EYV7-0): Not on file  Alcohol Screen: Not on file  Housing: Not on file  Utilities: Not on file  Health Literacy: Not on file     Vital Signs: There were no vitals taken for this visit.  Physical Exam Constitutional:      General: He is not in acute distress. HENT:     Head: Normocephalic.     Nose: Nose normal.  Eyes:     General: No scleral icterus. Cardiovascular:     Rate and Rhythm: Normal rate and regular rhythm.     Comments: Faintly palpable right DP, unable to palpable AT due to skin  induration Pulmonary:     Effort: No respiratory distress.  Abdominal:     General: There is no distension.  Musculoskeletal:     Right lower leg: Edema present.  Skin:    General: Skin is warm.     Comments: Induration and erythema about right ankle and foot with unchanged right lateral malleolar wound with persistent thin, light green exudate, no eschar  Neurological:     Mental Status: He is alert and oriented to person, place, and time.     Imaging:  ABI 11/26/23 BI Findings:  +---------+------------------+-----+----------+--------+  Right   Rt Pressure (mmHg)IndexWaveform  Comment   +---------+------------------+-----+----------+--------+  Brachial 154                                        +---------+------------------+-----+----------+--------+  ATA     95                0.62 monophasic          +---------+------------------+-----+----------+--------+  PTA     113               0.73 monophasic          +---------+------------------+-----+----------+--------+  Great Toe36                0.23                     +---------+------------------+-----+----------+--------+   +---------+------------------+-----+----------+-------+  Left    Lt Pressure (mmHg)IndexWaveform  Comment  +---------+------------------+-----+----------+-------+  Brachial 154                                       +---------+------------------+-----+----------+-------+  ATA     148               0.96 monophasic         +---------+------------------+-----+----------+-------+  PTA     150               0.97 monophasic         +---------+------------------+-----+----------+-------+  Burnetta Crawford  0.46                    +---------+------------------+-----+----------+-------+   +-------+-----------+-----------+------------+------------+  ABI/TBIToday's ABIToday's TBIPrevious ABIPrevious TBI   +-------+-----------+-----------+------------+------------+  Right 0.73       0.23       1.12        0.65          +-------+-----------+-----------+------------+------------+  Left  0.97       0.48       1.13        0.52          +-------+-----------+-----------+------------+------------+     Previous ABI on 07/07/20 at [facility] imaging.    Summary:  Right: Resting right ankle-brachial index indicates moderate right lower  extremity arterial disease. The right toe-brachial index is abnormal.   Left: The left toe-brachial index is abnormal.  Although ankle brachial indices are within normal limits (0.95-1.29),  arterial Doppler waveforms at the ankle suggest some component of arterial  occlusive disease.      CTA Runoff (12/07/23)  Right common iliac artery plaque, ~50% stenosis    Tibioperoneal trunk short segment occlusion, severe multifocal stenoses of the AT  Labs:  CBC: Recent Labs    12/24/23 0847 01/06/24 0906  WBC 8.7 9.9  HGB 12.5* 12.3*  HCT 37.7* 37.4*  PLT 306 302    COAGS: Recent Labs    12/24/23 0847 01/06/24 0906  INR 1.0 1.1    BMP: Recent Labs    12/24/23 0847  NA 141  K 4.1  CL 106  CO2 30  GLUCOSE 98  BUN 28*  CALCIUM  8.9  CREATININE 1.02  GFRNONAA >60    LIVER FUNCTION TESTS: No results for input(s): BILITOT, AST, ALT, ALKPHOS, PROT, ALBUMIN in the last 8760 hours.  Assessment and Plan: 89 year old male with a medical history significant for a non-healing right ankle wound present since May 2025. He also has a history of right lower extremity peripheral artery disease and underwent revascularization with two staged procedures performed 12/24/23 and 01/06/24. ABI on the right normalized but the patient's neurogenic pain failed to improve. There was also no significant healing of the right ankle wound. He was referred to podiatry who referred to Dr. Harden, who he has not seen.    -he is now interested in following  through with seeing Dr. Harden after our visit today, we can place additional referral to make sure he gets scheduled -continue aspirin  and plavix  -obtain right lower extremity arterial duplex to assess patency of stented iliac and recanalized tibial vessels -follow up in IR clinic in 3 months  Ester Sides, MD Pager: [phone]    I spent a total of 25 Minutes in face to face in clinical consultation, greater than 50% of which was counseling/coordinating care for non-healing right lower extremity wound.

## 2024-05-09 ENCOUNTER — Inpatient Hospital Stay
Admission: RE | Admit: 2024-05-09 | Discharge: 2024-05-09 | Disposition: A | Source: Ambulatory Visit | Attending: Interventional Radiology

## 2024-05-09 DIAGNOSIS — I739 Peripheral vascular disease, unspecified: Secondary | ICD-10-CM

## 2024-05-09 HISTORY — PX: IR RADIOLOGIST EVAL & MGMT: IMG5224

## 2024-05-10 ENCOUNTER — Other Ambulatory Visit: Payer: Self-pay | Admitting: Interventional Radiology

## 2024-05-10 DIAGNOSIS — I739 Peripheral vascular disease, unspecified: Secondary | ICD-10-CM

## 2024-05-11 ENCOUNTER — Other Ambulatory Visit: Payer: Self-pay | Admitting: Interventional Radiology

## 2024-05-11 DIAGNOSIS — I739 Peripheral vascular disease, unspecified: Secondary | ICD-10-CM

## 2024-05-17 ENCOUNTER — Encounter (INDEPENDENT_AMBULATORY_CARE_PROVIDER_SITE_OTHER): Payer: Self-pay | Admitting: Ophthalmology

## 2024-05-17 ENCOUNTER — Ambulatory Visit (INDEPENDENT_AMBULATORY_CARE_PROVIDER_SITE_OTHER): Admitting: Ophthalmology

## 2024-05-17 DIAGNOSIS — I10 Essential (primary) hypertension: Secondary | ICD-10-CM

## 2024-05-17 DIAGNOSIS — H353112 Nonexudative age-related macular degeneration, right eye, intermediate dry stage: Secondary | ICD-10-CM | POA: Diagnosis not present

## 2024-05-17 DIAGNOSIS — H35033 Hypertensive retinopathy, bilateral: Secondary | ICD-10-CM

## 2024-05-17 DIAGNOSIS — Z961 Presence of intraocular lens: Secondary | ICD-10-CM

## 2024-05-17 DIAGNOSIS — H353221 Exudative age-related macular degeneration, left eye, with active choroidal neovascularization: Secondary | ICD-10-CM

## 2024-05-17 MED ORDER — BEVACIZUMAB CHEMO INJECTION 1.25MG/0.05ML SYRINGE FOR KALEIDOSCOPE
1.2500 mg | INTRAVITREAL | Status: AC | PRN
  Administered 2024-05-17: 1.25 mg via INTRAVITREAL

## 2024-05-19 ENCOUNTER — Encounter: Payer: Self-pay | Admitting: Orthopedic Surgery

## 2024-05-19 ENCOUNTER — Ambulatory Visit: Admitting: Orthopedic Surgery

## 2024-05-19 DIAGNOSIS — I70261 Atherosclerosis of native arteries of extremities with gangrene, right leg: Secondary | ICD-10-CM | POA: Diagnosis not present

## 2024-05-19 DIAGNOSIS — I70238 Atherosclerosis of native arteries of right leg with ulceration of other part of lower right leg: Secondary | ICD-10-CM

## 2024-05-19 NOTE — Progress Notes (Unsigned)
 "  Office Visit Note   Patient: [name]           Date of Birth: 1933/01/19           MRN: [id number] Visit Date: 05/19/2024              Requested by: Silva Juliene SAUNDERS, DPM [address] [place],  [place] [address] PCP: Kip Righter, MD  Chief Complaint  Patient presents with   Right Leg - Wound Check      HPI: Discussed the use of AI scribe software for clinical note transcription with the patient, who gave verbal consent to proceed.  History of Present Illness [name] is an 89 year old male with critical limb ischemia who presents for evaluation of a non-healing right lower extremity ulcer and persistent pain.  He has an eight-month history of a non-healing ulcer on the anterolateral right calf, first noted on Sep 14, 2023. The wound measures approximately 4 x 4 cm, with a greenish-yellow base, fibrinous exudate, and scattered erythematous areas suggestive of partial healing. Some regions appear to be closing, but overall healing remains slow and incomplete.  He experiences severe, persistent pain in the right lower leg and foot, described as 'torture,' most pronounced upon waking and with weight-bearing. The pain worsened following a biopsy performed several months ago, resulting in increased hypersensitivity. Pain is exacerbated by wound care and is accompanied by blue and purple discoloration of the right foot and persistent right calf edema, though swelling has slightly improved since vascular intervention.  He has trialed multiple topical therapies, including Betadine (used for less than seven days), silver sulfadiazine (currently unavailable), and Neosporin. Dressing changes are performed twice daily. He has also previously used salt for wound care. Despite these interventions, the ulcer remains slow to heal and continues to cause significant pain.  He underwent endovascular intervention with abdominal stent placement and below-knee balloon angioplasty to improve  arterial flow to the right leg, resulting in minimal improvement in calf swelling and continued poor distal circulation, including symptoms in the right hallux. He is scheduled for a right lower extremity ultrasound tomorrow to further assess vascular status.     Assessment & Plan: Visit Diagnoses:  1. 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 Athscl natv art of right leg w ulcer oth prt lower right leg (HCC)     Plan: Assessment and Plan Assessment & Plan Critical limb ischemia, right lower extremity Severe arterial insufficiency with ischemic pain and tissue compromise. High risk of limb loss due to inadequate perfusion post-endovascular intervention. Immediate vascular reassessment and intervention required. - Scheduled right lower extremity ultrasound at [facility] - Instructed to follow up with Dr. Jennefer for vascular intervention. - Advised to contact us  if no further intervention is scheduled. - Scheduled follow-up in four weeks.  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 Healing impaired by severe arterial insufficiency, significant pain, and nerve involvement. - Changed wound dressing in clinic to Vashe solution. - Initiated daily Vashe dressing changes for wound care. - Provided instructions on local procurement of Vashe solution. - Advised daily dressing changes.  Peripheral arterial disease, right lower extremity Severe peripheral arterial disease contributing to critical limb ischemia and chronic ulcer. Prior interventions failed to restore adequate arterial flow. Persistent arterial insufficiency confirmed by Doppler. - Performed Doppler assessment of right foot pulses, confirming arterial insufficiency. - Advised to proceed with scheduled vascular  ultrasound to assess current arterial flow. - Discussed need for further vascular intervention if imaging confirms persistent  insufficiency.      Follow-Up Instructions: Return in about 4 weeks (around 06/16/2024).   Ortho Exam  Patient is alert, oriented, no adenopathy, well-dressed, normal affect, normal respiratory effort. Physical Exam CARDIOVASCULAR: Dampened monophasic dorsalis pedis and posterior tibial pulse with arterial insufficiency. SKIN: Ulcer on anterior lateral calf, 4x4 cm, 75% fibrinous exudative tissue. Foot appears blue and purple.      Imaging: No results found. No images are attached to the encounter.  Labs: Lab Results  Component Value Date   HGBA1C 5.8 (H) 06/18/2022     Lab Results  Component Value Date   ALBUMIN 3.4 (L) 06/17/2022   ALBUMIN 3.5 12/31/2009    No results found for: MG No results found for: VD25OH  No results found for: PREALBUMIN    Latest Ref Rng & Units 01/06/2024    9:06 AM 12/24/2023    8:47 AM 06/17/2022    2:11 PM  CBC EXTENDED  WBC 4.0 - 10.5 K/uL 9.9  8.7    RBC 4.22 - 5.81 MIL/uL 4.00  4.07    Hemoglobin 13.0 - 17.0 g/dL 87.6  87.4  85.6   HCT 39.0 - 52.0 % 37.4  37.7  42.0   Platelets 150 - 400 K/uL 302  306    NEUT# 1.7 - 7.7 K/uL 7.8  6.8    Lymph# 0.7 - 4.0 K/uL 0.9  0.8       There is no height or weight on file to calculate BMI.  Orders:  No orders of the defined types were placed in this encounter.  No orders of the defined types were placed in this encounter.    Procedures: No procedures performed  Clinical Data: No additional findings.  ROS:  All other systems negative, except as noted in the HPI. Review of Systems  Objective: Vital Signs: There were no vitals taken for this visit.  Specialty Comments:  No specialty comments available.  PMFS History: Patient Active Problem List   Diagnosis Date Noted   Neuropathy 06/17/2022   Acute CVA (cerebrovascular accident) (HCC) 06/17/2022   HTN (hypertension) 06/17/2022   Closed fracture of second cervical vertebra (HCC) 08/09/2013   Past Medical  History:  Diagnosis Date   Hypertension    Macular degeneration     History reviewed. No pertinent family history.  Past Surgical History:  Procedure Laterality Date   CATARACT EXTRACTION     EYE SURGERY     IR ANGIOGRAM EXTREMITY RIGHT  12/24/2023   IR ANGIOGRAM EXTREMITY RIGHT  01/06/2024   IR ANGIOGRAM PELVIS SELECTIVE OR SUPRASELECTIVE  12/24/2023   IR FEM POP ART ATHERECT INC PTA MOD SED  01/06/2024   IR ILIAC ART STENT INC PTA MOD SED  12/24/2023   IR INTRAVASCULAR ULTRASOUND NON CORONARY  12/24/2023   IR RADIOLOGIST EVAL & MGMT  12/11/2023   IR RADIOLOGIST EVAL & MGMT  02/22/2024   IR RADIOLOGIST EVAL & MGMT  05/09/2024   IR TIB-PERO ART ATHER INC PTA EA ADD VESSEL MOD SED  01/06/2024   IR TIB-PERO ART ATHEREC INC PTA MOD SED  01/06/2024   IR US  GUIDE VASC ACCESS RIGHT  12/24/2023   IR US  GUIDE VASC ACCESS RIGHT  01/06/2024   ROTATOR CUFF REPAIR Bilateral    Social History   Occupational History   Not on file  Tobacco Use   Smoking status: Never   Smokeless  tobacco: Not on file  Vaping Use   Vaping status: Unknown  Substance and Sexual Activity   Alcohol use: No   Drug use: No   Sexual activity: Not on file         "

## 2024-05-20 ENCOUNTER — Ambulatory Visit
Admission: RE | Admit: 2024-05-20 | Discharge: 2024-05-20 | Disposition: A | Source: Ambulatory Visit | Attending: Interventional Radiology

## 2024-05-20 ENCOUNTER — Other Ambulatory Visit: Payer: Self-pay | Admitting: Interventional Radiology

## 2024-05-20 DIAGNOSIS — I739 Peripheral vascular disease, unspecified: Secondary | ICD-10-CM

## 2024-05-24 ENCOUNTER — Other Ambulatory Visit (HOSPITAL_COMMUNITY): Payer: Self-pay | Admitting: Diagnostic Radiology

## 2024-05-25 ENCOUNTER — Other Ambulatory Visit (HOSPITAL_COMMUNITY): Payer: Self-pay | Admitting: Interventional Radiology

## 2024-05-25 DIAGNOSIS — I739 Peripheral vascular disease, unspecified: Secondary | ICD-10-CM

## 2024-05-30 ENCOUNTER — Other Ambulatory Visit (HOSPITAL_COMMUNITY): Payer: Self-pay | Admitting: Radiology

## 2024-05-31 ENCOUNTER — Other Ambulatory Visit (HOSPITAL_COMMUNITY): Payer: Self-pay | Admitting: Interventional Radiology

## 2024-05-31 ENCOUNTER — Encounter (HOSPITAL_COMMUNITY): Payer: Self-pay

## 2024-05-31 ENCOUNTER — Other Ambulatory Visit: Payer: Self-pay

## 2024-05-31 ENCOUNTER — Ambulatory Visit (HOSPITAL_COMMUNITY)
Admission: RE | Admit: 2024-05-31 | Discharge: 2024-05-31 | Disposition: A | Discharge status: Home / Self Care | Source: Ambulatory Visit | Attending: Interventional Radiology | Admitting: Interventional Radiology

## 2024-05-31 VITALS — BP 160/96 | HR 99 | Temp 97.7°F | Resp 16 | Ht 60.0 in | Wt 122.0 lb

## 2024-05-31 DIAGNOSIS — I739 Peripheral vascular disease, unspecified: Secondary | ICD-10-CM

## 2024-05-31 DIAGNOSIS — Z7982 Long term (current) use of aspirin: Secondary | ICD-10-CM | POA: Diagnosis not present

## 2024-05-31 DIAGNOSIS — Z7902 Long term (current) use of antithrombotics/antiplatelets: Secondary | ICD-10-CM | POA: Insufficient documentation

## 2024-05-31 DIAGNOSIS — I70235 Atherosclerosis of native arteries of right leg with ulceration of other part of foot: Secondary | ICD-10-CM | POA: Diagnosis present

## 2024-05-31 DIAGNOSIS — L97319 Non-pressure chronic ulcer of right ankle with unspecified severity: Secondary | ICD-10-CM | POA: Insufficient documentation

## 2024-05-31 DIAGNOSIS — I70233 Atherosclerosis of native arteries of right leg with ulceration of ankle: Secondary | ICD-10-CM | POA: Insufficient documentation

## 2024-05-31 DIAGNOSIS — L97519 Non-pressure chronic ulcer of other part of right foot with unspecified severity: Secondary | ICD-10-CM | POA: Insufficient documentation

## 2024-05-31 DIAGNOSIS — Z01818 Encounter for other preprocedural examination: Secondary | ICD-10-CM

## 2024-05-31 HISTORY — PX: IR ANGIOGRAM EXTREMITY RIGHT: IMG652

## 2024-05-31 HISTORY — PX: IR US GUIDE VASC ACCESS RIGHT: IMG2390

## 2024-05-31 LAB — CBC
HCT: 39.6 % (ref 39.0–52.0)
Hemoglobin: 13 g/dL (ref 13.0–17.0)
MCH: 29 pg (ref 26.0–34.0)
MCHC: 32.8 g/dL (ref 30.0–36.0)
MCV: 88.4 fL (ref 80.0–100.0)
Platelets: 297 K/uL (ref 150–400)
RBC: 4.48 MIL/uL (ref 4.22–5.81)
RDW: 15.3 % (ref 11.5–15.5)
WBC: 10.4 K/uL (ref 4.0–10.5)
nRBC: 0 % (ref 0.0–0.2)

## 2024-05-31 LAB — PROTIME-INR
INR: 1.1 (ref 0.8–1.2)
Prothrombin Time: 14.5 s (ref 11.4–15.2)

## 2024-05-31 MED ORDER — MIDAZOLAM HCL 2 MG/2ML IJ SOLN
INTRAMUSCULAR | Status: AC
  Filled 2024-05-31: qty 2

## 2024-05-31 MED ORDER — MIDAZOLAM HCL (PF) 2 MG/2ML IJ SOLN
INTRAMUSCULAR | Status: AC | PRN
  Administered 2024-05-31: .5 mg via INTRAVENOUS

## 2024-05-31 MED ORDER — IODIXANOL 320 MG/ML IV SOLN
100.0000 mL | Freq: Once | INTRAVENOUS | Status: AC | PRN
  Administered 2024-05-31: 30 mL via INTRA_ARTERIAL

## 2024-05-31 MED ORDER — SODIUM CHLORIDE 0.9 % IV SOLN: INTRAVENOUS | Status: DC

## 2024-05-31 MED ORDER — FENTANYL CITRATE (PF) 100 MCG/2ML IJ SOLN
INTRAMUSCULAR | Status: AC
  Filled 2024-05-31: qty 2

## 2024-05-31 MED ORDER — FENTANYL CITRATE (PF) 100 MCG/2ML IJ SOLN
INTRAMUSCULAR | Status: AC | PRN
  Administered 2024-05-31: 25 ug via INTRAVENOUS

## 2024-05-31 MED ORDER — HEPARIN SODIUM (PORCINE) 1000 UNIT/ML IJ SOLN
INTRAMUSCULAR | Status: AC
  Filled 2024-05-31: qty 10

## 2024-05-31 MED ORDER — LIDOCAINE-EPINEPHRINE 1 %-1:100000 IJ SOLN
INTRAMUSCULAR | Status: AC
  Filled 2024-05-31: qty 1

## 2024-05-31 NOTE — H&P (Addendum)
 "     Chief Complaint: Patient was seen in consultation today for right lower extremity non healing wound and pain-- lower extremity Bilateral arteriogram with possible intervention at the request of Dr CHRISTELLA Sage   Supervising Physician: Jennefer Rover  Patient Status: [facility] - Out-pt  History of Present Illness: [name] is a 89 y.o. male   FULL Code status per pt  Known to IR Previous RLE intervention with Dr Jennefer: peripheral artery disease and underwent revascularization with two staged procedures performed 12/24/23 and 01/06/24.  Last seen in  consultation with Dr Jennefer 05/09/24:   On 12/24/23 a right lower extremity angiogram revealed an approximate 50% stenosis of the right common iliac artery and a covered, balloon expandable stent was placed. The angiogram also showed short segment occlusion of the tibioperoneal trunk and long segment chronic occlusion of the anterior tibial artery.    On 01/06/24 he underwent a technically successful intraluminal recanalization with laser atherectomy of the distal popliteal, tibioperoneal trunk, and proximal peroneal arteries followed by plain balloon angioplasty of the same vessels. I also performed drug coated balloon angioplasty of the distal popliteal and tibioperoneal trunk. He tolerated both procedures well and he followed up with me 02/22/24. A repeat ABI study was completed 02/08/24 and this revealed normal findings. Unfortunately the patient was still having significant neurogenic pain and there had been no significant wound healing. He had been compliant with aspirin  and Plavix .    Pt was seen by Dr Sage 05/19/24:   Assessment & Plan: Visit Diagnoses:  1. 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 High risk of limb loss due to inadequate perfusion post-endovascular intervention. Immediate vascular reassessment and intervention required. - Scheduled right lower extremity ultrasound at [facility] - Instructed to follow up with Dr. Jennefer for vascular intervention. - Advised to contact us  if no further intervention is scheduled. - Scheduled follow-up in four weeks.  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 Healing impaired by severe arterial insufficiency, significant pain, and nerve involvement. - Changed wound dressing in clinic to Vashe solution. - Initiated daily Vashe dressing changes for wound care. - Provided instructions on local procurement of Vashe solution. - Advised daily dressing changes.   Peripheral arterial disease, right lower extremity Severe peripheral arterial disease contributing to critical limb ischemia and chronic ulcer. Prior interventions failed to restore adequate arterial flow. Persistent arterial insufficiency confirmed by Doppler. - Performed Doppler assessment of right foot pulses, confirming arterial insufficiency. - Advised to proceed with scheduled vascular ultrasound to assess current arterial flow. - Discussed need for further vascular intervention if imaging confirms persistent insufficiency.  Pt continues to have pain in Rt lateral ankle; wound has healed somewhat at this point Scheduled now for Bilateral lower extremity arteriogram with possible intervention in IR with Dr Jennefer    Past Medical History:  Diagnosis Date   Hypertension    Macular degeneration     Past Surgical History:  Procedure Laterality Date   CATARACT EXTRACTION     EYE SURGERY     IR ANGIOGRAM EXTREMITY RIGHT  12/24/2023   IR ANGIOGRAM EXTREMITY RIGHT  01/06/2024   IR ANGIOGRAM PELVIS SELECTIVE OR SUPRASELECTIVE  12/24/2023  IR FEM POP ART ATHERECT INC PTA MOD SED  01/06/2024   IR ILIAC  ART STENT INC PTA MOD SED  12/24/2023   IR INTRAVASCULAR ULTRASOUND NON CORONARY  12/24/2023   IR RADIOLOGIST EVAL & MGMT  12/11/2023   IR RADIOLOGIST EVAL & MGMT  02/22/2024   IR RADIOLOGIST EVAL & MGMT  05/09/2024   IR TIB-PERO ART ATHER INC PTA EA ADD VESSEL MOD SED  01/06/2024   IR TIB-PERO ART ATHEREC INC PTA MOD SED  01/06/2024   IR US  GUIDE VASC ACCESS RIGHT  12/24/2023   IR US  GUIDE VASC ACCESS RIGHT  01/06/2024   ROTATOR CUFF REPAIR Bilateral     Allergies: Prednisone  Medications: Prior to Admission medications  Medication Sig Start Date End Date Taking? Authorizing Provider  Ascorbic Acid (VITAMIN C PO) Take 1 tablet by mouth every Monday, Wednesday, and Friday. Takes along with vit e   Yes [provider]  aspirin  EC 81 MG tablet Take 1 tablet (81 mg total) by mouth daily. Swallow whole. 06/19/22  Yes Ghimire, [name], MD  b complex vitamins capsule Take 1 capsule by mouth daily in the afternoon.   Yes [provider]  Calcium  Carb-Cholecalciferol (CALCIUM  600 + D PO) Take 1 tablet by mouth 3 (three) times a week.   Yes [provider]  clopidogrel  (PLAVIX ) 75 MG tablet Take 1 tablet (75 mg total) by mouth daily. 12/25/23  Yes Covington, Jamie R, NP  gentamicin  ointment (GARAMYCIN ) 0.1 % Apply 1 Application topically daily. Apply to wound daily 02/25/24  Yes McDonald, Juliene SAUNDERS, DPM  Glucosamine HCl (GLUCOSAMINE PO) Take 1 tablet by mouth in the morning and at bedtime.   Yes [provider]  lisinopril -hydrochlorothiazide  (ZESTORETIC ) 20-12.5 MG tablet Take 1 tablet by mouth daily. 06/19/22  Yes Ghimire, [name], MD  lisinopril -hydrochlorothiazide  (ZESTORETIC ) 20-25 MG tablet Take 1 tablet by mouth daily.   Yes [provider]  Multiple Vitamins-Minerals (PRESERVISION AREDS PO) Take 1 tablet by mouth daily in the afternoon.   Yes [provider]  Omega-3 Fatty Acids (FISH OIL) 1000 MG CAPS Take 1,000 mg by mouth daily.   Yes [provider]  Potassium (POTASSIMIN PO) Take 1 tablet by mouth in the morning and at bedtime.   Yes [provider]  vitamin E 180 MG (400 UNITS) capsule Take 400 Units by mouth every Monday, Wednesday, and Friday.   Yes [provider]  lidocaine  (LIDODERM ) 5 % Place 1 patch onto the skin daily. Remove & Discard patch within 12 hours or as directed by MD 02/25/24   Silva Juliene SAUNDERS, DPM     History reviewed. No pertinent family history.  Social History   Socioeconomic History   Marital status: Married    Spouse name: Not on file   Number of children: Not on file   Years of education: Not on file   Highest education level: Not on file  Occupational History   Not on file  Tobacco Use   Smoking status: Never   Smokeless tobacco: Not on file  Vaping Use   Vaping status: Unknown  Substance and Sexual Activity   Alcohol use: No   Drug use: No   Sexual activity: Not on file  Other Topics Concern   Not on file  Social History Narrative   Not on file   Social Drivers of Health   Tobacco Use: Unknown (05/31/2024)   Patient History    Smoking Tobacco Use: Never  Smokeless Tobacco Use: Unknown    Passive Exposure: Not on file  Financial Resource Strain: Not on file  Food Insecurity: Not on file  Transportation Needs: Not on file  Physical Activity: Not on file  Stress: Not on file  Social Connections: Not on file  Depression (EYV7-0): Not on file  Alcohol Screen: Not on file  Housing: Not on file  Utilities: Not on file  Health Literacy: Not on file    Review of Systems: A 12 point ROS discussed and pertinent positives are indicated in the HPI above.  All other systems are negative.  Review of Systems  Constitutional:  Positive for activity change. Negative for fever.  HENT:  Positive for hearing loss.   Respiratory:  Negative for cough and shortness of breath.   Cardiovascular:  Negative for chest pain.  Gastrointestinal:  Negative for diarrhea,  nausea and vomiting.  Musculoskeletal:  Positive for gait problem.  Psychiatric/Behavioral:  Negative for behavioral problems and confusion.     Vital Signs: BP (!) 138/99   Pulse 78   Temp 97.7 F (36.5 C)   Resp 17   Ht 5' (1.524 m)   Wt 122 lb (55.3 kg)   SpO2 98%   BMI 23.83 kg/m   Advance Care Plan: The advanced care plan/surrogate decision maker was discussed at the time of visit and documented in the medical record.    Physical Exam Vitals reviewed.  HENT:     Mouth/Throat:     Mouth: Mucous membranes are moist.  Cardiovascular:     Rate and Rhythm: Normal rate.  Pulmonary:     Effort: Pulmonary effort is normal.     Breath sounds: No wheezing.  Abdominal:     Palpations: Abdomen is soft.  Musculoskeletal:        General: Normal range of motion.     Right lower leg: No edema.     Left lower leg: No edema.     Comments: RLE --- bandaged at ankle/foot  Skin:    General: Skin is warm.  Neurological:     Mental Status: He is alert and oriented to person, place, and time.  Psychiatric:        Behavior: Behavior normal.     Imaging: US  ARTERIAL LOWER EXTREMITY DUPLEX RIGHT(NON-ABI) Result Date: 05/21/2024 CLINICAL DATA:  Nonhealing wound at the right ankle. History of prior atherectomy and angioplasty of the distal popliteal artery, tibioperoneal trunk and peroneal arteries. EXAM: RIGHT LOWER EXTREMITY ARTERIAL DUPLEX SCAN US  ABIs TECHNIQUE: Gray-scale sonography as well as color Doppler and duplex ultrasound was performed to evaluate the lower extremity arteries including the common, superficial and profunda femoral arteries, popliteal artery and calf arteries. COMPARISON:  History of bilateral lower extremity noninvasive evaluation 02/08/2024 FINDINGS: Right lower Extremity ABI: 1.05 Inflow: Normal common femoral arterial waveforms and velocities. No evidence of inflow (aortoiliac) disease. Outflow: Normal profunda femoral, superficial femoral and popliteal arterial  waveforms and velocities. No focal elevation of the PSV to suggest stenosis. Runoff: Chronic occlusion of the proximal anterior tibial artery. No evidence of distal reconstitution. The tibioperoneal trunk and posterior tibial arteries are patent with good color flow and arterial waveforms. IMPRESSION: 1. Normal resting right ankle-brachial index of 1.05 2. 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 3. Patent lumen of the popliteal, tibioperoneal trunk and posterior tibial arteries. Electronically Signed   By: Wilkie Lent M.D.   On: 05/21/2024 10:58   US  ARTERIAL ABI (SCREENING LOWER EXTREMITY) Result Date: 05/21/2024 CLINICAL DATA:  Nonhealing wound at the right ankle. History of prior atherectomy and angioplasty of the distal popliteal artery, tibioperoneal trunk and peroneal arteries. EXAM: RIGHT LOWER EXTREMITY ARTERIAL DUPLEX SCAN US  ABIs TECHNIQUE: Gray-scale sonography as well as color Doppler and duplex ultrasound was performed to evaluate the lower extremity arteries including the common, superficial and profunda femoral arteries, popliteal artery and calf arteries. COMPARISON:  History of bilateral lower extremity noninvasive evaluation 02/08/2024 FINDINGS: Right lower Extremity ABI: 1.05 Inflow: Normal common femoral arterial waveforms and velocities. No evidence of inflow (aortoiliac) disease. Outflow: Normal profunda femoral, superficial femoral and popliteal arterial waveforms and velocities. No focal elevation of the PSV to suggest stenosis. Runoff: Chronic occlusion of the proximal anterior tibial artery. No evidence of distal reconstitution. The tibioperoneal trunk and posterior tibial arteries are patent with good color flow and arterial waveforms. IMPRESSION: 1. Normal resting right ankle-brachial index of 1.05 2. 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 3. Patent lumen of the popliteal, tibioperoneal trunk and posterior tibial arteries. Electronically Signed   By: Wilkie Lent M.D.   On: 05/21/2024 10:58   Intravitreal Injection, Pharmacologic Agent - OS - Left Eye Result Date: 05/17/2024 Time Out 05/17/2024. 2:32 PM. Confirmed correct patient, procedure, site, and patient consented. Anesthesia Topical anesthesia was used. Anesthetic medications included Lidocaine  2%, Proparacaine 0.5%. Procedure Preparation included 5% betadine to ocular surface, eyelid speculum. A supplied (32g) needle was used. Injection: 1.25 mg Bevacizumab  1.25mg /0.57ml   Route: Intravitreal, Site: Left Eye   NDC: 49757-939-98, Lot: [id number], Expiration date: 05/28/2024 Post-op Post injection exam found visual acuity of at least counting fingers. The patient tolerated the procedure well. There were no complications. The patient received written and verbal post procedure care education.   OCT, Retina - OU - Both Eyes Result Date: 05/17/2024 Right Eye Quality was good. Central Foveal Thickness: 292. Progression has been stable. Findings include normal foveal contour, no SRF, retinal drusen , intraretinal fluid, outer retinal atrophy (Patchy ORA, trace cystic changes centrally---persistent ). Left Eye Quality was good. Central Foveal Thickness: 253. Progression has been stable. Findings include normal foveal contour, no IRF, no SRF, retinal drusen , subretinal hyper-reflective material, intraretinal hyper-reflective material, pigment epithelial detachment, outer retinal atrophy (Stable improvement in central PEDs and SRHM; stable improvement in IRF/cystic changes, no fluid, patchy ORA). Notes *Images captured and stored on drive Diagnosis / Impression: OD: Nonexudative ARMD - Patchy ORA, trace cystic changes centrally-persistent OS: ex ARMD - Stable improvement in central PEDs and SRHM; stable improvement in IRF/cystic changes , patchy ORA Clinical management: See below Abbreviations: NFP - Normal foveal profile. CME - cystoid macular edema. PED - pigment epithelial detachment. IRF - intraretinal fluid. SRF -  subretinal fluid. EZ - ellipsoid zone. ERM - epiretinal membrane. ORA - outer retinal atrophy. ORT - outer retinal tubulation. SRHM - subretinal hyper-reflective material. IRHM - intraretinal hyper-reflective material   IR Radiologist Eval & Mgmt Result Date: 05/09/2024 EXAM: ESTABLISHED PATIENT OFFICE VISIT CHIEF COMPLAINT: See Epic note. HISTORY OF PRESENT ILLNESS: See Epic note. REVIEW OF SYSTEMS: See Epic note. PHYSICAL EXAMINATION: See Epic note. ASSESSMENT AND PLAN: See Epic note. Ester Sides, MD [facility] Electronically Signed   By: Ester Sides M.D.   On: 05/09/2024 15:38    Labs:  CBC: Recent Labs    12/24/23 0847 01/06/24 0906  WBC 8.7 9.9  HGB 12.5* 12.3*  HCT 37.7* 37.4*  PLT 306 302    COAGS: Recent Labs  12/24/23 0847 01/06/24 0906  INR 1.0 1.1    BMP: Recent Labs    12/24/23 0847  NA 141  K 4.1  CL 106  CO2 30  GLUCOSE 98  BUN 28*  CALCIUM  8.9  CREATININE 1.02  GFRNONAA >60    LIVER FUNCTION TESTS: No results for input(s): BILITOT, AST, ALT, ALKPHOS, PROT, ALBUMIN in the last 8760 hours.  TUMOR MARKERS: No results for input(s): AFPTM, CEA, CA199, CHROMGRNA in the last 8760 hours.   Scheduled today for Bilateral Right lower extremity arteriogram with possible intervention Risks and benefits of Bilateral lower extremity arteriogram with possible intervention were discussed with the patient including, but not limited to bleeding, infection, vascular injury or contrast induced renal failure.  This interventional procedure involves the use of X-rays and because of the nature of the planned procedure, it is possible that we will have prolonged use of X-ray fluoroscopy.  Potential radiation risks to you include (but are not limited to) the following: - A slightly elevated risk for cancer  several years later in life. This risk is typically less than 0.5% percent. This  risk is low in comparison to the normal incidence of human cancer, which is 33% for women and 50% for men according to the [employer] Society. - Radiation induced injury can include skin redness, resembling a rash, tissue breakdown / ulcers and hair loss (which can be temporary or permanent).   The likelihood of either of these occurring depends on the difficulty of the procedure and whether you are sensitive to radiation due to previous procedures, disease, or genetic conditions.   IF your procedure requires a prolonged use of radiation, you will be notified and given written instructions for further action.  It is your responsibility to monitor the irradiated area for the 2 weeks following the procedure and to notify your physician if you are concerned that you have suffered a radiation induced injury.    All of the patient's questions were answered, patient is agreeable to proceed.  Consent signed and in chart.  Thank you for this interesting consult.  I greatly enjoyed meeting [name] and look forward to participating in their care.  A copy of this report was sent to the requesting provider on this date.  Electronically Signed: Sharlet DELENA Candle, PA-C 05/31/2024, 9:37 AM   I spent a total of    25 Minutes in face to face in clinical consultation, greater than 50% of which was counseling/coordinating care for Bilateral lower extremity arteriogram with possible intervention "

## 2024-05-31 NOTE — Discharge Instructions (Signed)
 Femoral Site Care This sheet gives you information about how to care for yourself after your procedure. Your health care provider may also give you more specific instructions. If you have problems or questions, contact your health care provider. What can I expect after the procedure?  After the procedure, it is common to have: Bruising that usually fades within 1-2 weeks. Tenderness at the site. Follow these instructions at home: Wound care Follow instructions from your health care provider about how to take care of your insertion site. Make sure you: Wash your hands with soap and water  before you change your bandage (dressing). If soap and water  are not available, use hand sanitizer. Remove your dressing as told by your health care provider. 24 hours Do not take baths, swim, or use a hot tub until your health care provider approves. You may shower 24-48 hours after the procedure or as told by your health care provider. Gently wash the site with plain soap and water . Pat the area dry with a clean towel. Do not rub the site. This may cause bleeding. Do not apply powder or lotion to the site. Keep the site clean and dry. Check your femoral site every day for signs of infection. Check for: Redness, swelling, or pain. Fluid or blood. Warmth. Pus or a bad smell. Activity For the first 2-3 days after your procedure, or as long as directed: Avoid climbing stairs as much as possible. Do not squat. Do not lift anything that is heavier than 10 lb (4.5 kg), or the limit that you are told, until your health care provider says that it is safe. For 5 days Rest as directed. Avoid sitting for a long time without moving. Get up to take short walks every 1-2 hours. Do not drive for 24 hours if you were given a medicine to help you relax (sedative). General instructions Take over-the-counter and prescription medicines only as told by your health care provider. Keep all follow-up visits as told by your  health care provider. This is important. Contact a health care provider if you have: A fever or chills. You have redness, swelling, or pain around your insertion site. Get help right away if: The catheter insertion area swells very fast. You pass out. You suddenly start to sweat or your skin gets clammy. The catheter insertion area is bleeding, and the bleeding does not stop when you hold steady pressure on the area. The area near or just beyond the catheter insertion site becomes pale, cool, tingly, or numb. These symptoms may represent a serious problem that is an emergency. Do not wait to see if the symptoms will go away. Get medical help right away. Call your local emergency services (911 in the [place] Do not drive yourself to the hospital. Summary After the procedure, it is common to have bruising that usually fades within 1-2 weeks. Check your femoral site every day for signs of infection. Do not lift anything that is heavier than 10 lb (4.5 kg), or the limit that you are told, until your health care provider says that it is safe. This information is not intended to replace advice given to you by your health care provider. Make sure you discuss any questions you have with your health care provider. Document Revised: 05/11/2017 Document Reviewed: 05/11/2017 Elsevier Patient Education  2020 [employer].

## 2024-05-31 NOTE — Progress Notes (Signed)
 Pt and wife received discharge instructions. Teach back performed. Iv removed, no complications. Rt fem site is clean dry intact, site is soft, no signs of bleeding. Pt escorted out via wheelchair to wife's vehicle.

## 2024-06-16 ENCOUNTER — Other Ambulatory Visit: Payer: Self-pay | Admitting: Interventional Radiology

## 2024-06-16 DIAGNOSIS — I739 Peripheral vascular disease, unspecified: Secondary | ICD-10-CM

## 2024-06-23 ENCOUNTER — Ambulatory Visit: Admitting: Orthopedic Surgery

## 2024-08-23 ENCOUNTER — Encounter (INDEPENDENT_AMBULATORY_CARE_PROVIDER_SITE_OTHER): Admitting: Ophthalmology
